# Patient Record
Sex: Female | Born: 1960 | Race: White | Hispanic: No | Smoking: Never smoker
Health system: Southern US, Community
[De-identification: ages and names within clinical notes are randomized; demographics above are authoritative.]

## PROBLEM LIST (undated history)

## (undated) DIAGNOSIS — M5135 Other intervertebral disc degeneration, thoracolumbar region: Secondary | ICD-10-CM

## (undated) DIAGNOSIS — Z0000 Encounter for general adult medical examination without abnormal findings: Principal | ICD-10-CM

## (undated) DIAGNOSIS — K635 Polyp of colon: Secondary | ICD-10-CM

## (undated) DIAGNOSIS — F32A Depression, unspecified: Secondary | ICD-10-CM

## (undated) DIAGNOSIS — K648 Other hemorrhoids: Secondary | ICD-10-CM

## (undated) DIAGNOSIS — I10 Essential (primary) hypertension: Secondary | ICD-10-CM

## (undated) DIAGNOSIS — E785 Hyperlipidemia, unspecified: Secondary | ICD-10-CM

## (undated) DIAGNOSIS — M5030 Other cervical disc degeneration, unspecified cervical region: Secondary | ICD-10-CM

## (undated) DIAGNOSIS — T7840XA Allergy, unspecified, initial encounter: Secondary | ICD-10-CM

## (undated) DIAGNOSIS — H269 Unspecified cataract: Secondary | ICD-10-CM

## (undated) DIAGNOSIS — F419 Anxiety disorder, unspecified: Secondary | ICD-10-CM

## (undated) DIAGNOSIS — R000 Tachycardia, unspecified: Secondary | ICD-10-CM

## (undated) DIAGNOSIS — E669 Obesity, unspecified: Secondary | ICD-10-CM

## (undated) DIAGNOSIS — M25519 Pain in unspecified shoulder: Secondary | ICD-10-CM

## (undated) DIAGNOSIS — K449 Diaphragmatic hernia without obstruction or gangrene: Secondary | ICD-10-CM

## (undated) DIAGNOSIS — R12 Heartburn: Secondary | ICD-10-CM

## (undated) DIAGNOSIS — L918 Other hypertrophic disorders of the skin: Secondary | ICD-10-CM

## (undated) DIAGNOSIS — J189 Pneumonia, unspecified organism: Secondary | ICD-10-CM

## (undated) DIAGNOSIS — F329 Major depressive disorder, single episode, unspecified: Secondary | ICD-10-CM

## (undated) DIAGNOSIS — E039 Hypothyroidism, unspecified: Secondary | ICD-10-CM

## (undated) DIAGNOSIS — K859 Acute pancreatitis without necrosis or infection, unspecified: Secondary | ICD-10-CM

## (undated) DIAGNOSIS — C4491 Basal cell carcinoma of skin, unspecified: Secondary | ICD-10-CM

## (undated) DIAGNOSIS — H9190 Unspecified hearing loss, unspecified ear: Secondary | ICD-10-CM

## (undated) DIAGNOSIS — M1611 Unilateral primary osteoarthritis, right hip: Secondary | ICD-10-CM

## (undated) DIAGNOSIS — E559 Vitamin D deficiency, unspecified: Secondary | ICD-10-CM

## (undated) DIAGNOSIS — R51 Headache: Secondary | ICD-10-CM

## (undated) HISTORY — DX: Unspecified cataract: H26.9

## (undated) HISTORY — DX: Headache: R51

## (undated) HISTORY — DX: Depression, unspecified: F32.A

## (undated) HISTORY — DX: Other hypertrophic disorders of the skin: L91.8

## (undated) HISTORY — DX: Heartburn: R12

## (undated) HISTORY — DX: Pneumonia, unspecified organism: J18.9

## (undated) HISTORY — DX: Allergy, unspecified, initial encounter: T78.40XA

## (undated) HISTORY — DX: Polyp of colon: K63.5

## (undated) HISTORY — DX: Major depressive disorder, single episode, unspecified: F32.9

## (undated) HISTORY — DX: Other hemorrhoids: K64.8

## (undated) HISTORY — DX: Unilateral primary osteoarthritis, right hip: M16.11

## (undated) HISTORY — DX: Vitamin D deficiency, unspecified: E55.9

## (undated) HISTORY — DX: Essential (primary) hypertension: I10

## (undated) HISTORY — DX: Hyperlipidemia, unspecified: E78.5

## (undated) HISTORY — DX: Encounter for general adult medical examination without abnormal findings: Z00.00

## (undated) HISTORY — DX: Pain in unspecified shoulder: M25.519

## (undated) HISTORY — DX: Hypothyroidism, unspecified: E03.9

## (undated) HISTORY — DX: Anxiety disorder, unspecified: F41.9

## (undated) HISTORY — DX: Basal cell carcinoma of skin, unspecified: C44.91

## (undated) HISTORY — DX: Diaphragmatic hernia without obstruction or gangrene: K44.9

## (undated) HISTORY — PX: SPINE SURGERY: SHX786

## (undated) HISTORY — DX: Obesity, unspecified: E66.9

## (undated) HISTORY — DX: Tachycardia, unspecified: R00.0

## (undated) HISTORY — DX: Unspecified hearing loss, unspecified ear: H91.90

---

## 2000-11-06 ENCOUNTER — Encounter: Admission: RE | Admit: 2000-11-06 | Discharge: 2000-11-06 | Payer: Self-pay | Admitting: Internal Medicine

## 2000-11-06 ENCOUNTER — Encounter: Payer: Self-pay | Admitting: Internal Medicine

## 2000-12-22 DIAGNOSIS — I10 Essential (primary) hypertension: Secondary | ICD-10-CM

## 2000-12-22 HISTORY — DX: Essential (primary) hypertension: I10

## 2001-04-05 LAB — HM COLONOSCOPY: HM Colonoscopy: NORMAL

## 2001-04-26 ENCOUNTER — Encounter: Admission: RE | Admit: 2001-04-26 | Discharge: 2001-04-26 | Payer: Self-pay | Admitting: Obstetrics and Gynecology

## 2001-04-26 ENCOUNTER — Encounter: Payer: Self-pay | Admitting: Obstetrics and Gynecology

## 2001-04-28 ENCOUNTER — Encounter: Payer: Self-pay | Admitting: Obstetrics and Gynecology

## 2001-04-28 ENCOUNTER — Encounter: Admission: RE | Admit: 2001-04-28 | Discharge: 2001-04-28 | Payer: Self-pay | Admitting: Obstetrics and Gynecology

## 2001-04-29 ENCOUNTER — Other Ambulatory Visit: Admission: RE | Admit: 2001-04-29 | Discharge: 2001-04-29 | Payer: Self-pay | Admitting: Obstetrics and Gynecology

## 2001-04-29 ENCOUNTER — Encounter (INDEPENDENT_AMBULATORY_CARE_PROVIDER_SITE_OTHER): Payer: Self-pay

## 2001-11-12 ENCOUNTER — Encounter: Payer: Self-pay | Admitting: Obstetrics and Gynecology

## 2001-11-12 ENCOUNTER — Encounter: Admission: RE | Admit: 2001-11-12 | Discharge: 2001-11-12 | Payer: Self-pay | Admitting: Obstetrics and Gynecology

## 2002-02-16 ENCOUNTER — Emergency Department (HOSPITAL_COMMUNITY): Admission: EM | Admit: 2002-02-16 | Discharge: 2002-02-16 | Payer: Self-pay | Admitting: Emergency Medicine

## 2002-03-11 ENCOUNTER — Encounter: Payer: Self-pay | Admitting: Internal Medicine

## 2002-03-11 ENCOUNTER — Encounter: Admission: RE | Admit: 2002-03-11 | Discharge: 2002-03-11 | Payer: Self-pay | Admitting: Internal Medicine

## 2002-09-09 ENCOUNTER — Ambulatory Visit (HOSPITAL_COMMUNITY): Admission: RE | Admit: 2002-09-09 | Discharge: 2002-09-09 | Payer: Self-pay | Admitting: Gastroenterology

## 2003-06-23 ENCOUNTER — Encounter: Admission: RE | Admit: 2003-06-23 | Discharge: 2003-06-23 | Payer: Self-pay | Admitting: Obstetrics and Gynecology

## 2003-06-23 ENCOUNTER — Encounter: Payer: Self-pay | Admitting: Obstetrics and Gynecology

## 2004-07-03 ENCOUNTER — Other Ambulatory Visit: Admission: RE | Admit: 2004-07-03 | Discharge: 2004-07-03 | Payer: Self-pay | Admitting: Obstetrics and Gynecology

## 2005-07-22 ENCOUNTER — Encounter: Admission: RE | Admit: 2005-07-22 | Discharge: 2005-07-22 | Payer: Self-pay | Admitting: Internal Medicine

## 2005-10-08 ENCOUNTER — Other Ambulatory Visit: Admission: RE | Admit: 2005-10-08 | Discharge: 2005-10-08 | Payer: Self-pay | Admitting: Obstetrics and Gynecology

## 2005-10-21 ENCOUNTER — Encounter: Admission: RE | Admit: 2005-10-21 | Discharge: 2005-10-21 | Payer: Self-pay | Admitting: Obstetrics and Gynecology

## 2006-12-22 HISTORY — PX: ANTERIOR FUSION CERVICAL SPINE: SUR626

## 2007-02-18 ENCOUNTER — Other Ambulatory Visit: Admission: RE | Admit: 2007-02-18 | Discharge: 2007-02-18 | Payer: Self-pay | Admitting: Obstetrics and Gynecology

## 2007-09-01 ENCOUNTER — Encounter: Admission: RE | Admit: 2007-09-01 | Discharge: 2007-09-01 | Payer: Self-pay | Admitting: Obstetrics and Gynecology

## 2007-10-14 ENCOUNTER — Encounter: Admission: RE | Admit: 2007-10-14 | Discharge: 2007-10-14 | Payer: Self-pay | Admitting: Family Medicine

## 2007-10-16 ENCOUNTER — Encounter: Admission: RE | Admit: 2007-10-16 | Discharge: 2007-10-16 | Payer: Self-pay | Admitting: Family Medicine

## 2007-11-05 ENCOUNTER — Ambulatory Visit (HOSPITAL_COMMUNITY): Admission: RE | Admit: 2007-11-05 | Discharge: 2007-11-06 | Payer: Self-pay | Admitting: Neurosurgery

## 2008-02-24 ENCOUNTER — Other Ambulatory Visit: Admission: RE | Admit: 2008-02-24 | Discharge: 2008-02-24 | Payer: Self-pay | Admitting: Obstetrics and Gynecology

## 2008-05-11 ENCOUNTER — Emergency Department (HOSPITAL_COMMUNITY): Admission: EM | Admit: 2008-05-11 | Discharge: 2008-05-11 | Payer: Self-pay | Admitting: Emergency Medicine

## 2008-09-19 ENCOUNTER — Encounter: Admission: RE | Admit: 2008-09-19 | Discharge: 2008-09-19 | Payer: Self-pay | Admitting: Neurosurgery

## 2008-09-20 ENCOUNTER — Encounter: Admission: RE | Admit: 2008-09-20 | Discharge: 2008-09-20 | Payer: Self-pay | Admitting: Neurosurgery

## 2008-10-10 ENCOUNTER — Encounter: Admission: RE | Admit: 2008-10-10 | Discharge: 2008-10-10 | Payer: Self-pay | Admitting: Obstetrics and Gynecology

## 2009-01-09 ENCOUNTER — Encounter: Admission: RE | Admit: 2009-01-09 | Discharge: 2009-01-09 | Payer: Self-pay | Admitting: Neurosurgery

## 2009-02-26 ENCOUNTER — Other Ambulatory Visit: Admission: RE | Admit: 2009-02-26 | Discharge: 2009-02-26 | Payer: Self-pay | Admitting: Obstetrics and Gynecology

## 2009-06-19 ENCOUNTER — Encounter: Admission: RE | Admit: 2009-06-19 | Discharge: 2009-06-19 | Payer: Self-pay | Admitting: Neurosurgery

## 2009-08-25 ENCOUNTER — Encounter: Admission: RE | Admit: 2009-08-25 | Discharge: 2009-08-25 | Payer: Self-pay | Admitting: Neurosurgery

## 2009-12-22 HISTORY — PX: POSTERIOR FUSION CERVICAL SPINE: SUR628

## 2009-12-31 ENCOUNTER — Ambulatory Visit (HOSPITAL_COMMUNITY): Admission: RE | Admit: 2009-12-31 | Discharge: 2010-01-02 | Payer: Self-pay | Admitting: Neurosurgery

## 2010-01-31 ENCOUNTER — Encounter: Admission: RE | Admit: 2010-01-31 | Discharge: 2010-01-31 | Payer: Self-pay | Admitting: Neurosurgery

## 2010-03-04 ENCOUNTER — Other Ambulatory Visit: Admission: RE | Admit: 2010-03-04 | Discharge: 2010-03-04 | Payer: Self-pay | Admitting: Obstetrics and Gynecology

## 2010-03-07 ENCOUNTER — Encounter: Admission: RE | Admit: 2010-03-07 | Discharge: 2010-03-07 | Payer: Self-pay | Admitting: Neurosurgery

## 2010-04-24 ENCOUNTER — Encounter: Admission: RE | Admit: 2010-04-24 | Discharge: 2010-07-11 | Payer: Self-pay | Admitting: Neurosurgery

## 2010-11-28 ENCOUNTER — Emergency Department (HOSPITAL_BASED_OUTPATIENT_CLINIC_OR_DEPARTMENT_OTHER)
Admission: EM | Admit: 2010-11-28 | Discharge: 2010-11-28 | Payer: Self-pay | Source: Home / Self Care | Admitting: Emergency Medicine

## 2010-12-03 ENCOUNTER — Encounter
Admission: RE | Admit: 2010-12-03 | Discharge: 2010-12-03 | Payer: Self-pay | Source: Home / Self Care | Attending: Neurosurgery | Admitting: Neurosurgery

## 2010-12-26 ENCOUNTER — Encounter: Payer: Self-pay | Admitting: Family

## 2010-12-27 ENCOUNTER — Ambulatory Visit
Admission: RE | Admit: 2010-12-27 | Discharge: 2010-12-27 | Payer: Self-pay | Source: Home / Self Care | Attending: Family | Admitting: Family

## 2010-12-27 ENCOUNTER — Telehealth: Payer: Self-pay | Admitting: Family

## 2010-12-27 DIAGNOSIS — J329 Chronic sinusitis, unspecified: Secondary | ICD-10-CM | POA: Insufficient documentation

## 2011-01-23 NOTE — Progress Notes (Signed)
  Phone Note Other Incoming   Summary of Call: Fax received from pharmacy that patient has hx of allergy to sulfisoxazole/trimethoprim.  Spoke with patient, they did not dispense rx.  Will plan to treat with doxycycline and fluticasone.  Pt instructed to use nasal saline irrigation several times a day. Initial call taken by: Lemont Fillers FNP,  December 27, 2010 4:53 PM   New Allergies: ! SULFA New/Updated Medications: DOXYCYCLINE HYCLATE 100 MG CAPS (DOXYCYCLINE HYCLATE) one cap by mouth two times a day for 10 days FLUTICASONE PROPIONATE 50 MCG/ACT SUSP (FLUTICASONE PROPIONATE) 2 sprays each nostril once daily New Allergies: ! SULFAPrescriptions: FLUTICASONE PROPIONATE 50 MCG/ACT SUSP (FLUTICASONE PROPIONATE) 2 sprays each nostril once daily  #1 x 0   Entered and Authorized by:   Lemont Fillers FNP   Signed by:   Lemont Fillers FNP on 12/27/2010   Method used:   Electronically to        [facility] (retail)       [address]       [place], [place]  [address]       Ph: [phone]       Fax: [phone]   RxID:   [id number] DOXYCYCLINE HYCLATE 100 MG CAPS (DOXYCYCLINE HYCLATE) one cap by mouth two times a day for 10 days  #20 x 0   Entered and Authorized by:   Lemont Fillers FNP   Signed by:   Lemont Fillers FNP on 12/27/2010   Method used:   Electronically to        [facility] (retail)       [address]       [place], [place]  [address]       Ph: [phone]       Fax: [phone]   RxID:   [id number]

## 2011-01-23 NOTE — Assessment & Plan Note (Signed)
Summary: NEW TO BE EST [employer] [facility] HAS BAD COLD/[facility]--Rm 5   Vital Signs:  Patient profile:   50 year old female Menstrual status:  irregular LMP:     05/22/2010 Height:      208 inches Weight:      68 pounds BMI:     1.11 Temp:     97.5 degrees F oral Pulse rate:   102 / minute Pulse rhythm:   regular Resp:     18 per minute BP sitting:   110 / 80  (right arm) Cuff size:   large  Vitals Entered By: Mervin Kung CMA Duncan Dull) (December 27, 2010 11:22 AM) CC: Pt here prior to surgery on Monday. Has had cough, sneezing and sinus drainage x weeks. Was told she could not have surgery if she still has cough. Is Patient Diabetic? No Pain Assessment Patient in pain? no      Comments Pt states she has had 2 courses of Doxycycline with little improvement.  Pt states she is going to be seeing [place] for Primary Care eventually but is waiting until her disability closes with her current PMD.  Mervin Kung CMA Duncan Dull)  December 27, 2010 11:31 AM  LMP (date): 05/22/2010     Menstrual Status irregular Enter LMP: 05/22/2010   CC:  Pt here prior to surgery on Monday. Has had cough and sneezing and sinus drainage x weeks. Was told she could not have surgery if she still has cough..  History of Present Illness: Kathy Huerta is a 50 year old female who presents today to establish care.  She has been followed by Dr. Donnal Moat for primary care, but notes that she plans to move permanantly to [facility] as soon as her disability evaluation is completed under Dr. Susette Racer.  "I don't want to confuse matters any." Patient notes that she is scheduled for catarct surgery on monday. She comes with chief complaint of cough.    1) Cough-  Cough started about 12/7 while she was on vacation.  Has tried doxycline x 2 rounds- total of 19 days without improvement.  Took husbands "left over doxy" for the second round.  Denies fever.  Symptoms are associated with green sinus drainage/post-nasal drip L>R, mild left  ear pain, left sided facial pain and productive cough. She has not visualized the sputum. Cough is worsened with laying flat, and improved with sitting up.  2) Cervical Disc Disease- sees Dr.  Donalee Citrin- neurosurgery.   3) Cataracts- reports that this was caused by neurontin.  She is scheduled for cataract surgery on Monday.   Preventive Screening-Counseling & Management  Alcohol-Tobacco     Alcohol drinks/day: 4 per weekend     Alcohol type: beer     Smoking Status: quit     Pack years: 1 year  Caffeine-Diet-Exercise     Caffeine use/day: none     Does Patient Exercise: no  Allergies (verified): 1)  ! Augmentin 2)  ! Pcn 3)  ! Ancef 4)  ! Biaxin 5)  ! Zithromax 6)  ! Avelox 7)  ! Sulfa  Past History:  Past Medical History: DDD-- on disability HTN Depression Heartburn  Past Surgical History: Anterior Fusion of C6-7--2008 (Dr Wynetta Emery) Posterior Fusion of C6-7-- 2011  Family History: Reviewed history and no changes required. Mother-- living; HTN, hypercholesterolemia Father-- living; Colectomy, DDD, cardiac stent, A-fib. 1 brother-- HTN  No children  Social History: Reviewed history and no changes required. 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 Regular exercise-no Smoking Status:  quit Caffeine use/day:  none Does Patient Exercise:  no  Review of Systems       Constitutional: Denies Fever ENT:  see HPI Resp: see HPI, occasional SOB since she has been having uri symptoms CV:  Denies Chest Pain GI:  Denies nausea or vomitting or diarrhea GU: Denies dysuria Lymphatic: reports mild left anterior lymphadenopathy Musculoskeletal:  Denies muscle/joint pain- except chronic neck pain Skin:  Denies Rashes Psychiatric: depression- pt reports that this is well controlled.   Neuro: Denies numbness     Physical Exam  General:  Well-developed,well-nourished,in no  acute distress; alert,appropriate and cooperative throughout examination Head:  + maxillary sinus tenderness to palpation Eyes:  PERRLA Ears:  External ear exam shows no significant lesions or deformities.  Otoscopic examination reveals clear canals, tympanic membranes are intact bilaterally without bulging, retraction, inflammation or discharge. Hearing is grossly normal bilaterally. Mouth:  Oral mucosa and oropharynx without lesions or exudates.  Teeth in good repair. Neck:  No deformities, masses, or tenderness noted. Lungs:  Normal respiratory effort, chest expands symmetrically. Lungs are clear to auscultation, no crackles or wheezes. Heart:  Normal rate and regular rhythm. S1 and S2 normal without gallop, murmur, click, rub or other extra sounds. Psych:  Cognition and judgment appear intact. Alert and cooperative with normal attention span and concentration. No apparent delusions, illusions, hallucinations   Impression & Recommendations:  Problem # 1:  SINUSITIS (ICD-473.9) Assessment New Noted multiple drug allergies.  Initially planned to treat with bactrim- as she denied allergy.  Pharmacist called and alerted [place] that this was a listed allergy. Doxycyline was called in instead.   Her updated medication list for this problem includes:    Doxycycline Hyclate 100 Mg Caps (Doxycycline hyclate) ..... One cap by mouth two times a day for 10 days    Fluticasone Propionate 50 Mcg/act Susp (Fluticasone propionate) .Marland Kitchen... 2 sprays each nostril once daily  Complete Medication List: 1)  Flexeril 10 Mg Tabs (Cyclobenzaprine hcl) .... Take 1 tablet at bedtime. 2)  Valium 5 Mg Tabs (Diazepam) .... Take 1 tab by mouth at bedtime. 3)  Benicar 20 Mg Tabs (Olmesartan medoxomil) .... Take 1 tablet by mouth once a day. 4)  Maxzide-25 37.5-25 Mg Tabs (Triamterene-hctz) .... Take 1 tablet by mouth once a day. 5)  Percocet 5-325 Mg Tabs (Oxycodone-acetaminophen) .... Take 1 tablet every 6 hours as  needed. 6)  Zoloft 100 Mg Tabs (Sertraline hcl) .... Take 1 tablet by mouth every morning. 7)  Nevanac 0.1 % Susp (Nepafenac) .Marland Kitchen.. 1 drop left eye 4 times daily. 8)  Tobradex St 0.3-0.05 % Susp (Tobramycin-dexamethasone) .Marland Kitchen.. 1 dropt to left eye 4 times daily. 9)  Durezol 0.05 % Emul (Difluprednate) .Marland Kitchen.. 1 drop to left 4 times daily. 10)  Azasite 1 % Soln (Azithromycin) .Marland Kitchen.. 1 drop to left eye 4 times daily. 11)  Doxycycline Hyclate 100 Mg Caps (Doxycycline hyclate) .... One cap by mouth two times a day for 10 days 12)  Fluticasone Propionate 50 Mcg/act Susp (Fluticasone propionate) .... 2 sprays each nostril once daily  Patient Instructions: 1)  Call if you develop fever over 101, increasing sinus pressure, pain with eye movement, increased facial tenderness of swelling, or if you develop visual changes. 2)  Please schedule a complete physical at your earliest convenience. Prescriptions: BACTRIM 400-80 MG TABS (SULFAMETHOXAZOLE-TRIMETHOPRIM) one tablet by mouth two  times a day for 10 days  #20 x 0   Entered and Authorized by:   Lemont Fillers FNP   Signed by:   Lemont Fillers FNP on 12/27/2010   Method used:   Electronically to        [facility] (retail)       [address]       [place], [place]  [address]       Ph: [phone]       Fax: [phone]   RxID:   [id number]    Orders Added: 1)  New Patient Level III [16010]    Current Allergies (reviewed today): ! AUGMENTIN ! PCN ! ANCEF ! BIAXIN ! ZITHROMAX ! AVELOX ! SULFA

## 2011-01-23 NOTE — Medication Information (Signed)
Summary: Fax Regarding Allergy to Sulfisoxazole/[facility]  Fax Regarding Allergy to Sulfisoxazole/[facility]   Imported By: Lanelle Bal 01/15/2011 08:39:58  _____________________________________________________________________  External Attachment:    Type:   Image     Comment:   External Document

## 2011-03-03 LAB — HM PAP SMEAR: HM Pap smear: NORMAL

## 2011-03-06 ENCOUNTER — Other Ambulatory Visit: Payer: Self-pay | Admitting: Obstetrics and Gynecology

## 2011-03-06 ENCOUNTER — Other Ambulatory Visit (HOSPITAL_COMMUNITY)
Admission: RE | Admit: 2011-03-06 | Discharge: 2011-03-06 | Disposition: A | Discharge status: Home / Self Care | Payer: 59 | Source: Ambulatory Visit | Attending: Obstetrics and Gynecology | Admitting: Obstetrics and Gynecology

## 2011-03-06 DIAGNOSIS — Z01419 Encounter for gynecological examination (general) (routine) without abnormal findings: Secondary | ICD-10-CM | POA: Insufficient documentation

## 2011-03-09 LAB — BASIC METABOLIC PANEL
BUN: 13 mg/dL (ref 6–23)
CO2: 27 mEq/L (ref 19–32)
Chloride: 104 mEq/L (ref 96–112)
Potassium: 4.7 mEq/L (ref 3.5–5.1)

## 2011-03-09 LAB — CBC
HCT: 40.6 % (ref 36.0–46.0)
MCHC: 35.2 g/dL (ref 30.0–36.0)
MCV: 91.6 fL (ref 78.0–100.0)
Platelets: 295 10*3/uL (ref 150–400)
WBC: 7.8 10*3/uL (ref 4.0–10.5)

## 2011-04-07 ENCOUNTER — Emergency Department (HOSPITAL_BASED_OUTPATIENT_CLINIC_OR_DEPARTMENT_OTHER)
Admission: EM | Admit: 2011-04-07 | Discharge: 2011-04-07 | Disposition: A | Discharge status: Home / Self Care | Payer: 59 | Attending: Emergency Medicine | Admitting: Emergency Medicine

## 2011-04-07 DIAGNOSIS — A059 Bacterial foodborne intoxication, unspecified: Secondary | ICD-10-CM | POA: Insufficient documentation

## 2011-04-07 DIAGNOSIS — R197 Diarrhea, unspecified: Secondary | ICD-10-CM | POA: Insufficient documentation

## 2011-04-07 DIAGNOSIS — M62838 Other muscle spasm: Secondary | ICD-10-CM | POA: Insufficient documentation

## 2011-04-07 DIAGNOSIS — I10 Essential (primary) hypertension: Secondary | ICD-10-CM | POA: Insufficient documentation

## 2011-04-07 DIAGNOSIS — E785 Hyperlipidemia, unspecified: Secondary | ICD-10-CM | POA: Insufficient documentation

## 2011-04-07 DIAGNOSIS — Z79899 Other long term (current) drug therapy: Secondary | ICD-10-CM | POA: Insufficient documentation

## 2011-04-07 LAB — CBC
HCT: 38.6 % (ref 36.0–46.0)
Hemoglobin: 13.9 g/dL (ref 12.0–15.0)
MCV: 84.5 fL (ref 78.0–100.0)
RDW: 12.5 % (ref 11.5–15.5)
WBC: 11.2 10*3/uL — ABNORMAL HIGH (ref 4.0–10.5)

## 2011-04-07 LAB — POCT I-STAT, CHEM 8
BUN: 17 mg/dL (ref 6–23)
Calcium, Ion: 1.01 mmol/L — ABNORMAL LOW (ref 1.12–1.32)
Chloride: 102 mEq/L (ref 96–112)
Creatinine, Ser: 0.7 mg/dL (ref 0.4–1.2)
Glucose, Bld: 100 mg/dL — ABNORMAL HIGH (ref 70–99)

## 2011-04-07 LAB — DIFFERENTIAL
Basophils Relative: 0 % (ref 0–1)
Eosinophils Absolute: 0 10*3/uL (ref 0.0–0.7)
Eosinophils Relative: 0 % (ref 0–5)
Lymphs Abs: 0.4 10*3/uL — ABNORMAL LOW (ref 0.7–4.0)
Monocytes Relative: 4 % (ref 3–12)

## 2011-04-17 ENCOUNTER — Emergency Department (INDEPENDENT_AMBULATORY_CARE_PROVIDER_SITE_OTHER): Payer: 59

## 2011-04-17 ENCOUNTER — Telehealth: Payer: Self-pay | Admitting: *Deleted

## 2011-04-17 ENCOUNTER — Emergency Department (HOSPITAL_BASED_OUTPATIENT_CLINIC_OR_DEPARTMENT_OTHER)
Admission: EM | Admit: 2011-04-17 | Discharge: 2011-04-17 | Disposition: A | Discharge status: Home / Self Care | Payer: 59 | Attending: Emergency Medicine | Admitting: Emergency Medicine

## 2011-04-17 DIAGNOSIS — I10 Essential (primary) hypertension: Secondary | ICD-10-CM | POA: Insufficient documentation

## 2011-04-17 DIAGNOSIS — K859 Acute pancreatitis without necrosis or infection, unspecified: Secondary | ICD-10-CM | POA: Insufficient documentation

## 2011-04-17 DIAGNOSIS — R5381 Other malaise: Secondary | ICD-10-CM | POA: Insufficient documentation

## 2011-04-17 DIAGNOSIS — D72829 Elevated white blood cell count, unspecified: Secondary | ICD-10-CM

## 2011-04-17 DIAGNOSIS — R110 Nausea: Secondary | ICD-10-CM

## 2011-04-17 DIAGNOSIS — E785 Hyperlipidemia, unspecified: Secondary | ICD-10-CM | POA: Insufficient documentation

## 2011-04-17 DIAGNOSIS — Z79899 Other long term (current) drug therapy: Secondary | ICD-10-CM | POA: Insufficient documentation

## 2011-04-17 DIAGNOSIS — K5900 Constipation, unspecified: Secondary | ICD-10-CM

## 2011-04-17 LAB — COMPREHENSIVE METABOLIC PANEL
ALT: 91 U/L — ABNORMAL HIGH (ref 0–35)
Albumin: 4.6 g/dL (ref 3.5–5.2)
Alkaline Phosphatase: 105 U/L (ref 39–117)
BUN: 20 mg/dL (ref 6–23)
Chloride: 98 mEq/L (ref 96–112)
Glucose, Bld: 96 mg/dL (ref 70–99)
Potassium: 3.6 mEq/L (ref 3.5–5.1)
Sodium: 138 mEq/L (ref 135–145)
Total Bilirubin: 0.6 mg/dL (ref 0.3–1.2)
Total Protein: 8.1 g/dL (ref 6.0–8.3)

## 2011-04-17 LAB — URINALYSIS, ROUTINE W REFLEX MICROSCOPIC
Bilirubin Urine: NEGATIVE
Glucose, UA: NEGATIVE mg/dL
Hgb urine dipstick: NEGATIVE
Ketones, ur: NEGATIVE mg/dL
pH: 6.5 (ref 5.0–8.0)

## 2011-04-17 LAB — CBC
HCT: 38.5 % (ref 36.0–46.0)
MCV: 85.6 fL (ref 78.0–100.0)
Platelets: 342 10*3/uL (ref 150–400)
RBC: 4.5 MIL/uL (ref 3.87–5.11)
WBC: 14 10*3/uL — ABNORMAL HIGH (ref 4.0–10.5)

## 2011-04-17 LAB — URINE MICROSCOPIC-ADD ON

## 2011-04-17 LAB — POCT CARDIAC MARKERS
CKMB, poc: <1 ng/mL — ABNORMAL LOW (ref 1.0–8.0)
Troponin i, poc: <0.05 ng/mL (ref 0.00–0.09)

## 2011-04-17 LAB — DIFFERENTIAL
Basophils Absolute: 0 10*3/uL (ref 0.0–0.1)
Lymphocytes Relative: 17 % (ref 12–46)
Lymphs Abs: 2.4 10*3/uL (ref 0.7–4.0)
Neutrophils Relative %: 71 % (ref 43–77)

## 2011-04-17 LAB — CK: Total CK: 24 U/L (ref 7–177)

## 2011-04-17 NOTE — Telephone Encounter (Signed)
Received call from pt's husband stating that pt just called him crying that she tried to open her door and could not remember how to do that. He states this has never happened before. Reports that she just started Lipitor 2 days ago. He wanted to make an appt for pt to see Dr Artist Pais today. Advised him pt should be evaluated in the ER and he voices understanding.

## 2011-04-23 ENCOUNTER — Encounter: Payer: Self-pay | Admitting: Internal Medicine

## 2011-04-24 ENCOUNTER — Ambulatory Visit (INDEPENDENT_AMBULATORY_CARE_PROVIDER_SITE_OTHER): Payer: 59 | Admitting: Internal Medicine

## 2011-04-24 ENCOUNTER — Encounter: Payer: Self-pay | Admitting: Internal Medicine

## 2011-04-24 ENCOUNTER — Other Ambulatory Visit: Payer: Self-pay | Admitting: Internal Medicine

## 2011-04-24 ENCOUNTER — Telehealth: Payer: Self-pay | Admitting: *Deleted

## 2011-04-24 DIAGNOSIS — K859 Acute pancreatitis without necrosis or infection, unspecified: Secondary | ICD-10-CM

## 2011-04-24 DIAGNOSIS — I10 Essential (primary) hypertension: Secondary | ICD-10-CM | POA: Insufficient documentation

## 2011-04-24 DIAGNOSIS — R7401 Elevation of levels of liver transaminase levels: Secondary | ICD-10-CM | POA: Insufficient documentation

## 2011-04-24 DIAGNOSIS — M542 Cervicalgia: Secondary | ICD-10-CM

## 2011-04-24 DIAGNOSIS — G8929 Other chronic pain: Secondary | ICD-10-CM

## 2011-04-24 DIAGNOSIS — R7402 Elevation of levels of lactic acid dehydrogenase (LDH): Secondary | ICD-10-CM | POA: Insufficient documentation

## 2011-04-24 DIAGNOSIS — N179 Acute kidney failure, unspecified: Secondary | ICD-10-CM

## 2011-04-24 DIAGNOSIS — K219 Gastro-esophageal reflux disease without esophagitis: Secondary | ICD-10-CM

## 2011-04-24 HISTORY — DX: Acute pancreatitis without necrosis or infection, unspecified: K85.90

## 2011-04-24 LAB — BASIC METABOLIC PANEL WITH GFR
CO2: 26 mEq/L (ref 19–32)
Calcium: 10.1 mg/dL (ref 8.4–10.5)
Creat: 0.85 mg/dL (ref 0.40–1.20)
GFR, Est African American: >60 mL/min (ref >60)
GFR, Est Non African American: >60 mL/min (ref >60)
Sodium: 140 mEq/L (ref 135–145)

## 2011-04-24 LAB — HEPATIC FUNCTION PANEL
AST: 29 U/L (ref 0–37)
Albumin: 4.8 g/dL (ref 3.5–5.2)
Bilirubin, Direct: 0.1 mg/dL (ref 0.0–0.3)
Total Bilirubin: 0.4 mg/dL (ref 0.3–1.2)

## 2011-04-24 LAB — LIPASE: Lipase: 42 U/L (ref 0–75)

## 2011-04-24 MED ORDER — PANTOPRAZOLE SODIUM 40 MG PO TBEC: 40.0000 mg | DELAYED_RELEASE_TABLET | Freq: Every day | ORAL | Status: DC

## 2011-04-24 NOTE — Patient Instructions (Signed)
Increase fluid intake Stop your blood pressure medications

## 2011-04-24 NOTE — Telephone Encounter (Signed)
Received call from pt requesting a return call regarding her STAT lab results that were drawn this morning. Pt is anxious and awaiting a return call today. Please advise.

## 2011-04-24 NOTE — Telephone Encounter (Signed)
Pt notified of lab results Pt will call radiology to set up CT scan of neck and thoracic spine

## 2011-04-24 NOTE — Progress Notes (Signed)
Subjective:     Patient ID: Kathy Huerta, female   DOB: [birth year], 50 y.o.   MRN: [id number]  HPI  50 year old female with history of chronic cervical disease and hypertension to establish.  Patient reports emergency room evaluation on 04/17/2011 for food poisoning/acute gastroenteritis. Patient with treated with IV fluids.  Her lipase and liver enzymes were elevated. CT of abdomen and pelvis was negative ( Liver, gallbladder, biliary system, pancreas, spleen, and adrenal glands are within normal limits for noncontrast study and demonstrate no acute process. )  Her elevated lipase was attributed to dehydration and to statin use and Lipitor was discontinued.  Patient reports feeling somewhat weak and fatigued over the last 1 to 2 weeks.   She has been eating and drinking normally.  During same time period, patient has been working with her neurosurgeon regarding chronic left-sided neck and shoulder pain. Patient noted to have swelling near her left trapezius muscle.  Her symptoms thought to be secondary to muscle spasm.   She was treated with muscle relaxers and  Mobic 15 mg twice a day.  Review of Systems Weakness,  No nausea or vomiting.  Pt complains of GERD symptoms    Past Medical History  Diagnosis Date  . DDD (degenerative disc disease)     on disability  . HTN (hypertension)   . Depression   . Heartburn     History   Social History  . Marital Status: Married    Spouse Name: N/A    Number of Children: N/A  . Years of Education: N/A   Occupational History  . Not on file.   Social History Main Topics  . Smoking status: Former Games developer  . Smokeless tobacco: Not on file  . Alcohol Use: Not on file  . Drug Use: Not on file  . Sexually Active: Not on file   Other Topics Concern  . 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 Anterior fusion cervical spine 2008    C6-7  Dr Wynetta Emery  . Posterior fusion cervical spine 2011    C6-7    Family History  Problem Relation Age of Onset  . Hypertension Mother   . Hyperlipidemia Mother   . Other Father     DDD, colectomy,cardiac stent, afib  . Hypertension Brother     Allergies  Allergen Reactions  . Azithromycin     REACTION: Throat Swelling  . Cefazolin     REACTION: Throat Swelling  . Clarithromycin     REACTION: Throat Swelling  . YQM:VHQIONGEXBM+WUXLKGMWN+UUVOZDGUYQ Acid+Aspartame     REACTION: Throat swelling.  . Moxifloxacin     REACTION: Throat Swelling  . Penicillins     REACTION: throat swelling  . Sulfonamide Derivatives     Current Outpatient Prescriptions on File Prior to Visit  Medication Sig Dispense Refill  . diazepam (VALIUM) 5 MG tablet Take 5 mg by mouth at bedtime.        Marland Kitchen olmesartan (BENICAR) 20 MG tablet Take 20 mg by mouth daily.        . sertraline (ZOLOFT) 100 MG tablet Take 100 mg by mouth every morning.        . triamterene-hydrochlorothiazide (MAXZIDE-25) 37.5-25 MG per tablet Take  1 tablet by mouth daily.        Marland Kitchen azithromycin (AZASITE) 1 % ophthalmic solution Place 1 drop into the left eye 4 (four) times daily.        . Difluprednate (DUREZOL) 0.05 % EMUL Apply 1 drop to eye 4 (four) times daily. To left eye       . nepafenac (NEVANAC) 0.1 % ophthalmic suspension Place 3 drops into the left eye 4 (four) times daily.        Marland Kitchen oxyCODONE-acetaminophen (PERCOCET) 5-325 MG per tablet Take 1 tablet by mouth every 6 (six) hours as needed.          BP 118/64  Pulse 95  Temp(Src) 98.1 F (36.7 C) (Oral)  Resp 18  Ht 5\' 8"  (1.727 m)  Wt 201 lb (91.173 kg)  BMI 30.56 kg/m2  SpO2 97%    Objective:   Physical Exam  Constitutional: Appears well-developed and well-nourished.  No distress.  HENT:  Head: Normocephalic and atraumatic.  Mouth/Throat: Oropharynx is clear and moist.  Eyes: Conjunctivae are normal. Pupils are equal, round, and reactive to light.  Neck: localized edema left upper trapezius area .   Cardiovascular: Normal rate, regular rhythm and normal heart sounds.  Exam reveals no gallop and no friction rub.   No murmur heard. Pulmonary/Chest: Effort normal and breath sounds normal. He has no wheezes. He has no rales.  Abdominal: Soft. Bowel sounds are normal. He exhibits no mass. There is no tenderness.  Neurological: Alert. No cranial nerve deficit.  Skin: Skin is warm and dry.  Psychiatric: Normal mood and affect. Behavior is normal.      Assessment:         Plan:

## 2011-04-25 ENCOUNTER — Ambulatory Visit (INDEPENDENT_AMBULATORY_CARE_PROVIDER_SITE_OTHER)
Admission: RE | Admit: 2011-04-25 | Discharge: 2011-04-25 | Disposition: A | Discharge status: Home / Self Care | Payer: 59 | Source: Ambulatory Visit | Attending: Internal Medicine | Admitting: Internal Medicine

## 2011-04-25 ENCOUNTER — Ambulatory Visit (HOSPITAL_BASED_OUTPATIENT_CLINIC_OR_DEPARTMENT_OTHER)
Admission: RE | Admit: 2011-04-25 | Discharge: 2011-04-25 | Disposition: A | Discharge status: Home / Self Care | Payer: 59 | Source: Ambulatory Visit | Attending: Internal Medicine | Admitting: Internal Medicine

## 2011-04-25 DIAGNOSIS — M542 Cervicalgia: Secondary | ICD-10-CM

## 2011-04-25 DIAGNOSIS — Z981 Arthrodesis status: Secondary | ICD-10-CM

## 2011-04-25 DIAGNOSIS — IMO0002 Reserved for concepts with insufficient information to code with codable children: Secondary | ICD-10-CM | POA: Insufficient documentation

## 2011-04-25 DIAGNOSIS — M549 Dorsalgia, unspecified: Secondary | ICD-10-CM

## 2011-04-25 DIAGNOSIS — M5030 Other cervical disc degeneration, unspecified cervical region: Secondary | ICD-10-CM | POA: Insufficient documentation

## 2011-04-25 DIAGNOSIS — R209 Unspecified disturbances of skin sensation: Secondary | ICD-10-CM

## 2011-04-25 LAB — LIPID PANEL
Cholesterol: 190 mg/dL (ref 0–200)
HDL: 51 mg/dL (ref >39)
Total CHOL/HDL Ratio: 3.7 Ratio
Triglycerides: 212 mg/dL — ABNORMAL HIGH (ref <150)

## 2011-05-01 ENCOUNTER — Ambulatory Visit: Payer: 59 | Admitting: Internal Medicine

## 2011-05-06 NOTE — Op Note (Signed)
Kathy Huerta, Kathy Huerta                  ACCOUNT NO.:  [id number]   MEDICAL RECORD NO.:  [id number]          PATIENT TYPE:  AMB   LOCATION:  SDS                          FACILITY:  [facility]   PHYSICIAN:  Donalee Citrin, M.D.        DATE OF BIRTH:  04-14-1961   DATE OF PROCEDURE:  11/05/2007  DATE OF DISCHARGE:                               OPERATIVE REPORT   PREOPERATIVE DIAGNOSIS:  Cervical spondylosis C5-6, C6-7 with left  greater than right C6 and C7 radiculopathies.   PROCEDURE:  Anterior cervical diskectomies and fusion at C5-6 and C6-7  using 8 mm allograft wedge at C5-6 and a 9 mm allograft wedge at C6-7,  and a 43 mm Venture plate with six 16-XW variable angled screws.   SURGEON:  Donalee Citrin, M.D.   ASSISTANT:  Reinaldo Meeker, M.D.   ANESTHESIA:  General endotracheal.   HISTORY OF PRESENT ILLNESS:  The patient is a very pleasant 50 year old  female who has had longstanding neck pain and bilateral arm pain  radiating worse in the left arm down the triceps and the forearm, then  the thumb and first two fingers of her left hand.  This all started  experiencing down the right arm.  The patient had weakness in both  triceps at 4+/5 in the right and 4/5 in the left.  The patient failed  all forms of conservative treatment and the MRI scan showed severe  spondylosis with cervical stenosis at C5-6 and C6-7 with a large disk  herniation on the left at C6-7 and biforaminal stenosis in both levels.  The patient was recommended the anterior cervical diskectomy.  Procedure  risks and benefits of the operation were explained.  The patient  understands and agreed to proceed forth.   The patient was brought to the OR and was induced under general  anesthesia.  She was placed supine.  The neck was placed in extension, 5  pounds of Holter traction on the right.  Cervical neck was prepped and  draped in the usual sterile fashion.  Preoperative x-ray localized the  C6 vertebral body, so a curvilinear  incision was made just off the  midline to anterior portion to the sternocleidomastoid and superficial  layer of the platysma was dissected and divided longitudinally.  The  avascular planes to the sternocleidomastoid strap muscles was developed  under the prevertebral fascia.  Prevertebral fascia was divided using  Kitner's.  Intraoperative x-ray confirmed localization of the C5-6  interspace.  Annulotomy was done with the 15 blade scalpel, marked the  disk space and then longus colli was reflected laterally and septal  retractor was placed.  The annulotomies were extended at both 5-6 and 6-  7.  Large anterior osteophytes were bitten off with a 3-mm Kerrison  punch at both C5 and C6 and then both interspaces were drilled down to  the posterior annulus and posterior osteophytic complexes, first working  at C6-7.  The operative microscope was draped, brought into the field.  Under microscopic illumination, the remainder of the disk space was  drilled down  to posterior annulus and then using a 1 mm Kerrison punch,  the undersurface of the posterior annulus and the posterior longitudinal  ligament was underbitten, exposing the thecal sac.  This was noted to be  displaced markedly dorsally with a large free fragment disk herniation  causing thecal sac compression as well as left C7 foraminal stenosis.  Very large several fragments of disk were teased out with a black nerve  hook and removed in a piecemeal fashion, then the end plates were  aggressively underbitten.  The C7 pedicle was identified and the C7  nerve root was skeletonized flush with the pedicle and then easily took  a nerve hook confirming no migration of disk material further in the  foramen.  Then the end plates were aggressively underbitten marginally  across to the right side.  The right C7 pedicle was identified and  palpated and the right C7 nerve root was skeletonized.  Then Gelfoam was  placed.  Attention was taken to the  C5-6.  In a similar fashion,  aggressive diskectomies were performed with underbiting of the end  plates to the level of both C6 pedicles and both C6 nerve roots were  decompressed and skeletonized out the foramen.  Then both sets of end  plates were scraped with a BA curet.  Meticulous hemostasis was  maintained.  First, an 8 graft was inserted at C6-7, however, this was  felt to be too small so this had to be placed at C5-6 after we had  already opened up a 7 graft for C5-6, which was also noted to be too  small.  So we had to discard the 7 graft.  The 8 graft was inserted at  C5-6 and then a 9 graft was opened and inserted at C6-7.  Then a Venture  plate was placed 42 mm and six 13 mm variable angled screws were drilled  and placed.  All screws had excellent purchase.  The locking mechanism  was engaged.  The wound was then copiously irrigated.  Meticulous  hemostasis was maintained.  The platysma was reapproximated with  interrupted Vicryl and the skin was closed with running 4-0 subcuticular  and benzoin and Steri-Strips were applied.  The patient went to the  recovery room in stable condition.  At the end of the case, needle  counts and sponge counts were correct.           ______________________________  Donalee Citrin, M.D.     GC/MEDQ  D:  11/05/2007  T:  11/05/2007  Job:  [id number]

## 2011-05-09 NOTE — Op Note (Signed)
   NAME:  Kathy Huerta, Kathy Huerta                            ACCOUNT NO.:  [id number]   MEDICAL RECORD NO.:  [id number]                   PATIENT TYPE:  AMB   LOCATION:  ENDO                                 FACILITY:  [facility]   PHYSICIAN:  Charna Elizabeth, M.D.                   DATE OF BIRTH:  02-17-1961   DATE OF PROCEDURE:  09/09/2002  DATE OF DISCHARGE:  09/09/2002                                 OPERATIVE REPORT   PROCEDURE PERFORMED:  Colonoscopy.   ENDOSCOPIST:  Charna Elizabeth, M.D.   INSTRUMENT USED:  Olympus video colonoscope.   INDICATIONS FOR PROCEDURE:  The patient is a 50 year old white female with a  history of severe constipation with worsening of her problems in the recent  past and rectal bleeding.  The patient has a family history of colonic  polyps in her father.  Rule out colonic polyps, masses, etc.   PREPROCEDURE PREPARATION:  Informed consent was procured from the patient.  The patient was fasted for eight hours prior to the procedure and prepped  with a bottle of magnesium citrate and a gallon of NuLytely the night prior  to the procedure.  She also used two Fleet's enemas the night prior to the  procedure.   PREPROCEDURE PHYSICAL:  The patient had stable vital signs. Neck supple.  Chest clear to auscultation.  S1 and S2 regular.  Abdomen soft with normal  bowel sounds.   DESCRIPTION OF PROCEDURE:  The patient was placed in left lateral decubitus  position and sedated with 70 mg of Demerol and 8 mg of Versed intravenously.  Once the patient was adequately sedated and maintained on low flow oxygen  and continuous cardiac monitoring, the Olympus video colonoscope was  advanced from the rectum to the cecum and terminal ileum without difficulty.  Except for small nonbleeding internal hemorrhoids, no other abnormalities  were noted.  The entire colonic mucosa appeared healthy with a normal  vascular pattern.   IMPRESSION:  Normal colon up to the terminal ileum except for  small  nonbleeding internal hemorrhoids.   RECOMMENDATIONS:  1. The patient has been advised to restart Zelnorm 6 mg b.i.d.  2. Liberal fluid intake has been recommended along with a high fiber diet.  3. Outpatient follow-up in the next two weeks.                                                  Charna Elizabeth, M.D.   JM/MEDQ  D:  09/14/2002  T:  09/15/2002  Job:  [id number]   cc:   Sharlet Salina, M.D.

## 2011-05-13 ENCOUNTER — Other Ambulatory Visit: Payer: Self-pay | Admitting: Neurosurgery

## 2011-05-13 DIAGNOSIS — M549 Dorsalgia, unspecified: Secondary | ICD-10-CM

## 2011-05-13 DIAGNOSIS — M25512 Pain in left shoulder: Secondary | ICD-10-CM

## 2011-05-20 ENCOUNTER — Ambulatory Visit
Admission: RE | Admit: 2011-05-20 | Discharge: 2011-05-20 | Disposition: A | Discharge status: Home / Self Care | Payer: 59 | Source: Ambulatory Visit | Attending: Neurosurgery | Admitting: Neurosurgery

## 2011-05-20 ENCOUNTER — Other Ambulatory Visit: Payer: Self-pay | Admitting: Neurosurgery

## 2011-05-20 DIAGNOSIS — M25512 Pain in left shoulder: Secondary | ICD-10-CM

## 2011-05-20 DIAGNOSIS — M549 Dorsalgia, unspecified: Secondary | ICD-10-CM

## 2011-05-20 MED ORDER — GADOBENATE DIMEGLUMINE 529 MG/ML IV SOLN
19.0000 mL | Freq: Once | INTRAVENOUS | Status: AC | PRN
  Administered 2011-05-20: 19 mL via INTRAVENOUS

## 2011-07-11 DIAGNOSIS — G8929 Other chronic pain: Secondary | ICD-10-CM | POA: Insufficient documentation

## 2011-07-11 DIAGNOSIS — K219 Gastro-esophageal reflux disease without esophagitis: Secondary | ICD-10-CM | POA: Insufficient documentation

## 2011-07-11 NOTE — Assessment & Plan Note (Signed)
Pt has hx of ant and post cervical fusion performed by Dr. Wynetta Emery.   Pain mgt as per Dr. Wynetta Emery

## 2011-07-11 NOTE — Assessment & Plan Note (Addendum)
Pt reports recent bout of presumed gastroenteritis.  Her symptoms are resolving.  Monitor LFTs.  Hold Lipitor for now.

## 2011-07-11 NOTE — Assessment & Plan Note (Signed)
Her GERD symptoms likely exacerbated by Mobic use.  Use protonix for gastric protection.

## 2011-07-11 NOTE — Assessment & Plan Note (Signed)
BP: 96/64 mmHg  Pt BP is low normal.  Hold BP meds for now.  Continue to monitor BP at home.  Pt advised to reports any significant change in her blood pressure Lab Results  Component Value Date   CREATININE 0.85 04/24/2011   Lab Results  Component Value Date   NA 140 04/24/2011   K 4.6 04/24/2011   CL 102 04/24/2011   CO2 26 04/24/2011

## 2011-07-29 ENCOUNTER — Ambulatory Visit (INDEPENDENT_AMBULATORY_CARE_PROVIDER_SITE_OTHER): Payer: 59 | Admitting: Internal Medicine

## 2011-07-29 ENCOUNTER — Encounter: Payer: Self-pay | Admitting: Internal Medicine

## 2011-07-29 DIAGNOSIS — M7989 Other specified soft tissue disorders: Secondary | ICD-10-CM

## 2011-07-29 DIAGNOSIS — M799 Soft tissue disorder, unspecified: Secondary | ICD-10-CM

## 2011-07-31 DIAGNOSIS — M7989 Other specified soft tissue disorders: Secondary | ICD-10-CM | POA: Insufficient documentation

## 2011-07-31 NOTE — Progress Notes (Signed)
  Subjective:    Patient ID: Kathy Huerta, female    DOB: [birth year], 50 y.o.   MRN: [id number]  HPI patient presents to clinic for evaluation of soft tissue mass. Has undergone cervical neck surgery and has been recently evaluated by surgery for a left paracentral soft tissue mass located roughly along medial left trapezius area. Area is nontender. No injury or trauma. Surgery has recommended MRI for further evaluation. Reviewed with patient her cervical CT and thoracic MRI results. No other complaints. Total time of visit approximately 28 minutes of which greater than 50% was spent in counseling.    Review of Systems     Objective:   Physical Exam  Nursing note and vitals reviewed. Constitutional: She appears well-developed and well-nourished. No distress.  Musculoskeletal:       Soft tissue mass nontender located left of cervical spine. Does not appear to communicate with spine. Non-mobile. No overlying rash or wound.  Skin: She is not diaphoretic.          Assessment & Plan:

## 2011-07-31 NOTE — Assessment & Plan Note (Signed)
Recommend proceeding with MRI for further evaluation

## 2011-08-18 ENCOUNTER — Ambulatory Visit: Payer: 59 | Attending: Physical Medicine and Rehabilitation | Admitting: Physical Therapy

## 2011-08-18 DIAGNOSIS — M542 Cervicalgia: Secondary | ICD-10-CM | POA: Insufficient documentation

## 2011-08-18 DIAGNOSIS — M2560 Stiffness of unspecified joint, not elsewhere classified: Secondary | ICD-10-CM | POA: Insufficient documentation

## 2011-08-18 DIAGNOSIS — IMO0001 Reserved for inherently not codable concepts without codable children: Secondary | ICD-10-CM | POA: Insufficient documentation

## 2011-08-20 ENCOUNTER — Ambulatory Visit: Payer: 59 | Admitting: Physical Therapy

## 2011-08-26 ENCOUNTER — Ambulatory Visit: Payer: 59 | Attending: Physical Medicine and Rehabilitation | Admitting: Rehabilitation

## 2011-08-26 DIAGNOSIS — M542 Cervicalgia: Secondary | ICD-10-CM | POA: Insufficient documentation

## 2011-08-26 DIAGNOSIS — M2560 Stiffness of unspecified joint, not elsewhere classified: Secondary | ICD-10-CM | POA: Insufficient documentation

## 2011-08-26 DIAGNOSIS — IMO0001 Reserved for inherently not codable concepts without codable children: Secondary | ICD-10-CM | POA: Insufficient documentation

## 2011-08-27 ENCOUNTER — Ambulatory Visit: Payer: 59 | Admitting: Physical Therapy

## 2011-08-29 ENCOUNTER — Ambulatory Visit: Payer: 59 | Admitting: Physical Therapy

## 2011-09-01 ENCOUNTER — Ambulatory Visit: Payer: 59 | Admitting: Physical Therapy

## 2011-09-04 ENCOUNTER — Ambulatory Visit: Payer: 59 | Admitting: Rehabilitation

## 2011-09-08 ENCOUNTER — Ambulatory Visit: Payer: 59 | Admitting: Physical Therapy

## 2011-09-10 ENCOUNTER — Ambulatory Visit: Payer: 59 | Admitting: Physical Therapy

## 2011-09-12 ENCOUNTER — Encounter: Payer: 59 | Admitting: Physical Therapy

## 2011-09-15 ENCOUNTER — Ambulatory Visit: Payer: 59 | Admitting: Physical Therapy

## 2011-09-17 LAB — URINE MICROSCOPIC-ADD ON

## 2011-09-17 LAB — URINALYSIS, ROUTINE W REFLEX MICROSCOPIC
Bilirubin Urine: NEGATIVE
Hgb urine dipstick: NEGATIVE
Ketones, ur: NEGATIVE
Nitrite: NEGATIVE
pH: 7

## 2011-09-17 LAB — DIFFERENTIAL
Basophils Relative: 1
Eosinophils Absolute: 0.3
Monocytes Relative: 7
Neutrophils Relative %: 64

## 2011-09-17 LAB — CBC
Hemoglobin: 14.3
RBC: 4.63

## 2011-09-17 LAB — COMPREHENSIVE METABOLIC PANEL
ALT: 28
Alkaline Phosphatase: 89
CO2: 25
GFR calc non Af Amer: >60
Glucose, Bld: 103 — ABNORMAL HIGH
Potassium: 3.7
Sodium: 139

## 2011-09-17 LAB — SEDIMENTATION RATE: Sed Rate: 11

## 2011-09-17 LAB — MONONUCLEOSIS SCREEN: Mono Screen: NEGATIVE

## 2011-09-29 ENCOUNTER — Ambulatory Visit: Payer: 59 | Attending: Physical Medicine and Rehabilitation | Admitting: Physical Therapy

## 2011-09-29 DIAGNOSIS — IMO0001 Reserved for inherently not codable concepts without codable children: Secondary | ICD-10-CM | POA: Insufficient documentation

## 2011-09-29 DIAGNOSIS — M2560 Stiffness of unspecified joint, not elsewhere classified: Secondary | ICD-10-CM | POA: Insufficient documentation

## 2011-09-29 DIAGNOSIS — M542 Cervicalgia: Secondary | ICD-10-CM | POA: Insufficient documentation

## 2011-09-30 LAB — CBC
HCT: 42.7
Hemoglobin: 14.7
RBC: 4.83
WBC: 8.9

## 2011-09-30 LAB — BASIC METABOLIC PANEL
GFR calc Af Amer: >60
GFR calc non Af Amer: >60
Glucose, Bld: 85
Potassium: 3.3 — ABNORMAL LOW
Sodium: 139

## 2011-10-01 ENCOUNTER — Ambulatory Visit: Payer: 59 | Admitting: Physical Therapy

## 2011-10-06 ENCOUNTER — Ambulatory Visit: Payer: 59 | Admitting: Physical Therapy

## 2011-10-08 ENCOUNTER — Other Ambulatory Visit (HOSPITAL_BASED_OUTPATIENT_CLINIC_OR_DEPARTMENT_OTHER): Payer: Self-pay | Admitting: Obstetrics and Gynecology

## 2011-10-08 ENCOUNTER — Ambulatory Visit: Payer: 59 | Admitting: Physical Therapy

## 2011-10-08 DIAGNOSIS — Z1231 Encounter for screening mammogram for malignant neoplasm of breast: Secondary | ICD-10-CM

## 2011-10-10 ENCOUNTER — Ambulatory Visit (HOSPITAL_BASED_OUTPATIENT_CLINIC_OR_DEPARTMENT_OTHER)
Admission: RE | Admit: 2011-10-10 | Discharge: 2011-10-10 | Disposition: A | Discharge status: Home / Self Care | Payer: 59 | Source: Ambulatory Visit | Attending: Obstetrics and Gynecology | Admitting: Obstetrics and Gynecology

## 2011-10-10 DIAGNOSIS — R928 Other abnormal and inconclusive findings on diagnostic imaging of breast: Secondary | ICD-10-CM | POA: Insufficient documentation

## 2011-10-10 DIAGNOSIS — Z1231 Encounter for screening mammogram for malignant neoplasm of breast: Secondary | ICD-10-CM | POA: Insufficient documentation

## 2011-10-17 ENCOUNTER — Encounter: Payer: Self-pay | Admitting: Internal Medicine

## 2011-10-17 ENCOUNTER — Ambulatory Visit (INDEPENDENT_AMBULATORY_CARE_PROVIDER_SITE_OTHER): Payer: 59 | Admitting: Internal Medicine

## 2011-10-17 VITALS — BP 120/90 | HR 99 | Temp 97.8°F | Resp 18 | Ht 68.0 in | Wt 192.0 lb

## 2011-10-17 DIAGNOSIS — L989 Disorder of the skin and subcutaneous tissue, unspecified: Secondary | ICD-10-CM

## 2011-10-17 DIAGNOSIS — I10 Essential (primary) hypertension: Secondary | ICD-10-CM

## 2011-10-17 DIAGNOSIS — Z23 Encounter for immunization: Secondary | ICD-10-CM

## 2011-10-17 MED ORDER — OLMESARTAN MEDOXOMIL 20 MG PO TABS: 20.0000 mg | ORAL_TABLET | Freq: Every day | ORAL | Status: DC

## 2011-10-17 MED ORDER — TRIAMTERENE-HCTZ 37.5-25 MG PO TABS: 1.0000 | ORAL_TABLET | Freq: Every day | ORAL | Status: DC

## 2011-10-19 DIAGNOSIS — L989 Disorder of the skin and subcutaneous tissue, unspecified: Secondary | ICD-10-CM | POA: Insufficient documentation

## 2011-10-19 NOTE — Assessment & Plan Note (Signed)
Irritated skin lesion. Dermatology consult

## 2011-10-19 NOTE — Progress Notes (Signed)
  Subjective:    Patient ID: Kathy Huerta, female    DOB: May 19, 1961, 50 y.o.   MRN: [id number]  HPI Pt presents to clinic for followup of multiple medical problems. States mri of upper post back was nl and ST prominence improves with PT. Has mole on Huerta lower abd that is persistently irritated. Bleeds periodically and never heals entirely. Is irritated by pants waistline. Stopped benicar for one month due to dizziness and sbp's in the 90 range. Dizziness resolved and bp minimally high today. No other complaints.  Past Medical History  Diagnosis Date  . DDD (degenerative disc disease)     on disability  . HTN (hypertension)   . Depression   . Heartburn    Past Surgical History  Procedure Date  . Anterior fusion cervical spine 2008    C6-7  Dr Wynetta Emery  . Posterior fusion cervical spine 2011    C6-7    reports that she has quit smoking. She has never used smokeless tobacco. She reports that she drinks alcohol. She reports that she does not use illicit drugs. family history includes Hyperlipidemia in her mother; Hypertension in her brother and mother; and Other in her father. Allergies  Allergen Reactions  . Azithromycin     REACTION: Throat Swelling  . Cefazolin     REACTION: Throat Swelling  . Clarithromycin     REACTION: Throat Swelling  . WUJ:WJXBJYNWGNF+AOZHYQMVH+QIONGEXBMW Acid+Aspartame     REACTION: Throat swelling.  . Moxifloxacin     REACTION: Throat Swelling  . Penicillins     REACTION: throat swelling  . Sulfonamide Derivatives      Review of Systems     Objective:   Physical Exam  Nursing note and vitals reviewed. Constitutional: She appears well-developed and well-nourished. No distress.  HENT:  Head: Normocephalic and atraumatic.  Eyes: Conjunctivae are normal. No scleral icterus.  Neurological: She is alert.  Skin: Skin is warm and dry. No rash noted. She is not diaphoretic.       Huerta lower abd: <1cm skin lesion irritated. Sl scabbing.   Psychiatric:  She has a normal mood and affect.          Assessment & Plan:

## 2011-10-19 NOTE — Assessment & Plan Note (Signed)
Isolated elevation. outpt bp log. If elevated then resume benicar but at 10mg  qd.

## 2011-10-27 ENCOUNTER — Ambulatory Visit: Payer: 59 | Attending: Physical Medicine and Rehabilitation | Admitting: Physical Therapy

## 2011-10-27 DIAGNOSIS — M542 Cervicalgia: Secondary | ICD-10-CM | POA: Insufficient documentation

## 2011-10-27 DIAGNOSIS — IMO0001 Reserved for inherently not codable concepts without codable children: Secondary | ICD-10-CM | POA: Insufficient documentation

## 2011-10-27 DIAGNOSIS — M2560 Stiffness of unspecified joint, not elsewhere classified: Secondary | ICD-10-CM | POA: Insufficient documentation

## 2011-10-29 ENCOUNTER — Other Ambulatory Visit: Payer: Self-pay | Admitting: Obstetrics and Gynecology

## 2011-10-29 DIAGNOSIS — R928 Other abnormal and inconclusive findings on diagnostic imaging of breast: Secondary | ICD-10-CM

## 2011-10-30 ENCOUNTER — Ambulatory Visit
Admission: RE | Admit: 2011-10-30 | Discharge: 2011-10-30 | Disposition: A | Discharge status: Home / Self Care | Payer: 59 | Source: Ambulatory Visit | Attending: Obstetrics and Gynecology | Admitting: Obstetrics and Gynecology

## 2011-10-30 DIAGNOSIS — R928 Other abnormal and inconclusive findings on diagnostic imaging of breast: Secondary | ICD-10-CM

## 2011-11-03 ENCOUNTER — Encounter: Payer: 59 | Admitting: Physical Therapy

## 2011-11-05 ENCOUNTER — Encounter: Payer: 59 | Admitting: Physical Therapy

## 2011-11-07 ENCOUNTER — Ambulatory Visit: Payer: 59 | Admitting: Physical Therapy

## 2011-11-17 ENCOUNTER — Ambulatory Visit: Payer: 59 | Admitting: Physical Therapy

## 2011-11-17 DIAGNOSIS — C4491 Basal cell carcinoma of skin, unspecified: Secondary | ICD-10-CM

## 2011-11-17 HISTORY — DX: Basal cell carcinoma of skin, unspecified: C44.91

## 2011-11-24 ENCOUNTER — Encounter: Payer: Self-pay | Admitting: Family

## 2011-11-24 ENCOUNTER — Ambulatory Visit: Payer: 59 | Attending: Physical Medicine and Rehabilitation | Admitting: Physical Therapy

## 2011-11-24 DIAGNOSIS — IMO0001 Reserved for inherently not codable concepts without codable children: Secondary | ICD-10-CM | POA: Insufficient documentation

## 2011-11-24 DIAGNOSIS — M542 Cervicalgia: Secondary | ICD-10-CM | POA: Insufficient documentation

## 2011-11-24 DIAGNOSIS — M2560 Stiffness of unspecified joint, not elsewhere classified: Secondary | ICD-10-CM | POA: Insufficient documentation

## 2011-11-25 ENCOUNTER — Ambulatory Visit (INDEPENDENT_AMBULATORY_CARE_PROVIDER_SITE_OTHER): Payer: 59 | Admitting: Internal Medicine

## 2011-11-25 ENCOUNTER — Other Ambulatory Visit: Payer: Self-pay | Admitting: Internal Medicine

## 2011-11-25 ENCOUNTER — Encounter: Payer: Self-pay | Admitting: Internal Medicine

## 2011-11-25 VITALS — BP 120/72 | HR 98 | Temp 97.9°F | Resp 18 | Wt 189.0 lb

## 2011-11-25 DIAGNOSIS — J029 Acute pharyngitis, unspecified: Secondary | ICD-10-CM

## 2011-11-25 MED ORDER — DOXYCYCLINE HYCLATE 100 MG PO TABS: 100.0000 mg | ORAL_TABLET | Freq: Two times a day (BID) | ORAL | Status: AC

## 2011-11-25 NOTE — Progress Notes (Signed)
  Subjective:    Patient ID: Kathy Huerta, female    DOB: [birth year], 50 y.o.   MRN: [id number]  HPI Pt presents to clinic for evaluation of sore throat. Notes 1d h/o ST. +strep exposure with husband. Reviewed multiple abx allergies. No f/c or difficulty swallowing/controlling secretions. No alleviating or exacerbating factors.  Past Medical History  Diagnosis Date  . DDD (degenerative disc disease)     on disability  . HTN (hypertension)   . Depression   . Heartburn   . Basal cell carcinoma 11/17/11    Dr. Naoma Diener   Past Surgical History  Procedure Date  . Anterior fusion cervical spine 2008    C6-7  Dr Wynetta Emery  . Posterior fusion cervical spine 2011    C6-7    reports that she has quit smoking. She has never used smokeless tobacco. She reports that she drinks alcohol. She reports that she does not use illicit drugs. family history includes Hyperlipidemia in her mother; Hypertension in her brother and mother; and Other in her father. Allergies  Allergen Reactions  . Azithromycin     REACTION: Throat Swelling  . Cefazolin     REACTION: Throat Swelling  . Clarithromycin     REACTION: Throat Swelling  . WUJ:WJXBJYNWGNF+AOZHYQMVH+QIONGEXBMW Acid+Aspartame     REACTION: Throat swelling.  . Moxifloxacin     REACTION: Throat Swelling  . Penicillins     REACTION: throat swelling  . Sulfonamide Derivatives      Review of Systems see hpi ]     Objective:   Physical Exam  Nursing note and vitals reviewed. Constitutional: She appears well-developed and well-nourished. No distress.  HENT:  Head: Normocephalic and atraumatic.  Huerta Ear: Tympanic membrane, external ear and ear canal normal.  Left Ear: Tympanic membrane, external ear and ear canal normal.  Nose: Nose normal.  Mouth/Throat: Uvula is midline and mucous membranes are normal. Posterior oropharyngeal erythema present. No oropharyngeal exudate, posterior oropharyngeal edema or tonsillar abscesses.  Eyes: Conjunctivae  are normal. Huerta eye exhibits no discharge. Left eye exhibits no discharge. No scleral icterus.  Neck: Neck supple.  Lymphadenopathy:    She has no cervical adenopathy.  Neurological: She is alert.  Skin: Skin is warm and dry. She is not diaphoretic.  Psychiatric: She has a normal mood and affect.          Assessment & Plan:

## 2011-11-27 LAB — CULTURE, GROUP A STREP

## 2011-11-29 DIAGNOSIS — J029 Acute pharyngitis, unspecified: Secondary | ICD-10-CM | POA: Insufficient documentation

## 2011-11-29 NOTE — Assessment & Plan Note (Signed)
Rapid strep obtained and neg. Send swab for culture. Avoiding empiric abx due to multiple abx allergies

## 2011-12-05 ENCOUNTER — Ambulatory Visit: Payer: 59 | Admitting: Physical Therapy

## 2011-12-08 ENCOUNTER — Ambulatory Visit: Payer: 59 | Admitting: Physical Therapy

## 2012-03-09 ENCOUNTER — Other Ambulatory Visit: Payer: Self-pay | Admitting: Obstetrics and Gynecology

## 2012-03-09 ENCOUNTER — Other Ambulatory Visit (HOSPITAL_COMMUNITY)
Admission: RE | Admit: 2012-03-09 | Discharge: 2012-03-09 | Disposition: A | Discharge status: Home / Self Care | Payer: 59 | Source: Ambulatory Visit | Attending: Obstetrics and Gynecology | Admitting: Obstetrics and Gynecology

## 2012-03-09 DIAGNOSIS — Z01419 Encounter for gynecological examination (general) (routine) without abnormal findings: Secondary | ICD-10-CM | POA: Insufficient documentation

## 2012-04-08 ENCOUNTER — Telehealth: Payer: Self-pay | Admitting: Family

## 2012-04-08 MED ORDER — SERTRALINE HCL 100 MG PO TABS: 100.0000 mg | ORAL_TABLET | ORAL | Status: DC

## 2012-04-08 NOTE — Telephone Encounter (Signed)
Please advise re: refills and when pt should follow up in the office?

## 2012-04-08 NOTE — Telephone Encounter (Signed)
#  30 x no refills sent to pharmacy. Please call pt to arrange follow up before further refills are needed.

## 2012-04-08 NOTE — Telephone Encounter (Signed)
Informed patient that a 30 day supply of sertraline has been sent to pharmacy. She states that she usually see's Dr. Rodena Medin. She wants to start seeing dr. Rodena Medin from now on. She has an appointment on 04/12/12 with him.

## 2012-04-08 NOTE — Telephone Encounter (Signed)
Ok  To give 30 day supply, no refills. Needs follow up pls.

## 2012-04-08 NOTE — Telephone Encounter (Signed)
Refill-sertraline hcl 100mg  tab. Take one tablet daily. Qty 90 last fill 1.15.13

## 2012-04-12 ENCOUNTER — Encounter: Payer: Self-pay | Admitting: Internal Medicine

## 2012-04-12 ENCOUNTER — Ambulatory Visit (INDEPENDENT_AMBULATORY_CARE_PROVIDER_SITE_OTHER): Payer: 59 | Admitting: Internal Medicine

## 2012-04-12 ENCOUNTER — Other Ambulatory Visit: Payer: Self-pay | Admitting: Internal Medicine

## 2012-04-12 VITALS — BP 92/64 | HR 93 | Temp 98.4°F | Ht 68.0 in | Wt 181.0 lb

## 2012-04-12 DIAGNOSIS — E785 Hyperlipidemia, unspecified: Secondary | ICD-10-CM | POA: Insufficient documentation

## 2012-04-12 DIAGNOSIS — IMO0001 Reserved for inherently not codable concepts without codable children: Secondary | ICD-10-CM

## 2012-04-12 DIAGNOSIS — I10 Essential (primary) hypertension: Secondary | ICD-10-CM

## 2012-04-12 DIAGNOSIS — Z1211 Encounter for screening for malignant neoplasm of colon: Secondary | ICD-10-CM

## 2012-04-12 DIAGNOSIS — M791 Myalgia, unspecified site: Secondary | ICD-10-CM | POA: Insufficient documentation

## 2012-04-12 DIAGNOSIS — Z79899 Other long term (current) drug therapy: Secondary | ICD-10-CM

## 2012-04-12 MED ORDER — SERTRALINE HCL 100 MG PO TABS: 100.0000 mg | ORAL_TABLET | ORAL | Status: DC

## 2012-04-12 NOTE — Patient Instructions (Signed)
Please schedule fasting labs for this Weds morning (4/24) Cbc, tsh- 401.9, chem7-v58.69 and lipid/lft 272.4

## 2012-04-12 NOTE — Assessment & Plan Note (Signed)
Obtain vitamin d level 

## 2012-04-12 NOTE — Progress Notes (Signed)
  Subjective:    Patient ID: Kathy Huerta, female    DOB: Feb 05, 1961, 51 y.o.   MRN: [id number]  HPI Pt presents to clinic for followup of multiple medical problems. BP noted to be sbp ~92 auscultation and palpation by radial artery. Currently asx but has noted recent dizziness without syncope. +thirst. Last colonoscopy little over 10 years. Has intermittent myalgias without triggers.   Past Medical History  Diagnosis Date  . DDD (degenerative disc disease)     on disability  . HTN (hypertension)   . Depression   . Heartburn   . Basal cell carcinoma 11/17/11    Dr. Naoma Diener   Past Surgical History  Procedure Date  . Anterior fusion cervical spine 2008    C6-7  Dr Wynetta Emery  . Posterior fusion cervical spine 2011    C6-7    reports that she has quit smoking. She has never used smokeless tobacco. She reports that she drinks alcohol. She reports that she does not use illicit drugs. family history includes Hyperlipidemia in her mother; Hypertension in her brother and mother; and Other in her father. Allergies  Allergen Reactions  . Azithromycin     REACTION: Throat Swelling  . Cefazolin     REACTION: Throat Swelling  . Clarithromycin     REACTION: Throat Swelling  . JYN:WGNFAOZHYQM+VHQIONGEX+BMWUXLKGMW Acid+Aspartame     REACTION: Throat swelling.  . Moxifloxacin     REACTION: Throat Swelling  . Penicillins     REACTION: throat swelling  . Sulfonamide Derivatives       Review of Systems see hpi     Objective:   Physical Exam  Nursing note and vitals reviewed. Constitutional: She appears well-developed and well-nourished. No distress.  HENT:  Head: Normocephalic and atraumatic.  Huerta Ear: External ear normal.  Left Ear: External ear normal.  Eyes: Conjunctivae are normal. No scleral icterus.  Cardiovascular: Normal rate, regular rhythm and normal heart sounds.  Exam reveals no gallop and no friction rub.   No murmur heard. Neurological: She is alert.  Skin: She is not  diaphoretic.  Psychiatric: She has a normal mood and affect.          Assessment & Plan:

## 2012-04-12 NOTE — Assessment & Plan Note (Signed)
Obtain lipid/lft. 

## 2012-04-12 NOTE — Assessment & Plan Note (Signed)
Mildly low control but intermittently symptomatic. Dc benicar. Take 1/2 dose diuretic qd. Increase fluid intake. Monitor bp and call results to clinic for review. Obtain cbc, chem7

## 2012-04-13 ENCOUNTER — Encounter: Payer: Self-pay | Admitting: Internal Medicine

## 2012-04-14 LAB — CBC
MCH: 30.1 pg (ref 26.0–34.0)
MCHC: 33.7 g/dL (ref 30.0–36.0)
MCV: 89.2 fL (ref 78.0–100.0)
Platelets: 290 10*3/uL (ref 150–400)
RDW: 12.6 % (ref 11.5–15.5)
WBC: 6.5 10*3/uL (ref 4.0–10.5)

## 2012-04-14 LAB — BASIC METABOLIC PANEL
BUN: 12 mg/dL (ref 6–23)
CO2: 27 mEq/L (ref 19–32)
Calcium: 9.4 mg/dL (ref 8.4–10.5)
Chloride: 104 mEq/L (ref 96–112)
Creat: 0.6 mg/dL (ref 0.50–1.10)

## 2012-04-14 LAB — LIPID PANEL
LDL Cholesterol: 162 mg/dL — ABNORMAL HIGH (ref 0–99)
Triglycerides: 183 mg/dL — ABNORMAL HIGH (ref <150)

## 2012-04-14 LAB — TSH: TSH: 3.4 u[IU]/mL (ref 0.350–4.500)

## 2012-04-15 ENCOUNTER — Telehealth: Payer: Self-pay | Admitting: *Deleted

## 2012-04-15 LAB — VITAMIN D 25 HYDROXY (VIT D DEFICIENCY, FRACTURES): Vit D, 25-Hydroxy: 60 ng/mL (ref 30–89)

## 2012-04-15 NOTE — Telephone Encounter (Signed)
bp was low. Now normal. Continue what she's doing. (off benicar and 1/2 dose diuretic). Enjoy vacation

## 2012-04-15 NOTE — Telephone Encounter (Signed)
Call placed to patient at [phone], she was informed per Dr Rodena Medin instructions and has verbalized understanding.

## 2012-04-15 NOTE — Telephone Encounter (Signed)
Report received in nursing in box with note from patient with the following blood pressure readings.  4/22 9 pm  117/73 4/23 6 am  126/77  4/23 3 pm 119/78

## 2012-04-16 ENCOUNTER — Other Ambulatory Visit: Payer: Self-pay | Admitting: Internal Medicine

## 2012-04-16 DIAGNOSIS — E785 Hyperlipidemia, unspecified: Secondary | ICD-10-CM

## 2012-04-26 ENCOUNTER — Ambulatory Visit: Payer: 59 | Admitting: Internal Medicine

## 2012-04-26 ENCOUNTER — Encounter: Payer: Self-pay | Admitting: Internal Medicine

## 2012-04-26 ENCOUNTER — Ambulatory Visit (INDEPENDENT_AMBULATORY_CARE_PROVIDER_SITE_OTHER): Payer: 59 | Admitting: Internal Medicine

## 2012-04-26 VITALS — BP 118/74 | HR 96 | Temp 97.6°F | Resp 18 | Wt 186.0 lb

## 2012-04-26 DIAGNOSIS — M549 Dorsalgia, unspecified: Secondary | ICD-10-CM

## 2012-04-26 MED ORDER — KETOROLAC TROMETHAMINE 30 MG/ML IJ SOLN
30.0000 mg | Freq: Once | INTRAMUSCULAR | Status: AC
  Administered 2012-04-26: 30 mg via INTRAMUSCULAR

## 2012-04-26 MED ORDER — MELOXICAM 7.5 MG PO TABS: ORAL_TABLET | ORAL | Status: AC

## 2012-04-26 NOTE — Assessment & Plan Note (Signed)
Given toradol im injxn. Begin mobic with food and no other nsaids. Continue muscle relaxer and percocet under direction of pain clinic. Consider PT if sx's do not improve.

## 2012-04-26 NOTE — Progress Notes (Signed)
  Subjective:    Patient ID: Kathy Huerta, female    DOB: 1961/10/23, 51 y.o.   MRN: [id number]  HPI Pt presents to clinic for evaluation of back pain. Notes 6-7 day h/o bilateral low back pain that radiates slightly to left upper leg. No paresthesias, leg weakness or urinary incontinence. No specific injury but began after walking on the beach. Currently taking muscle relaxer and percoet under the direction of the pain clinic. Pain worsens with position change. No other complaints.  Past Medical History  Diagnosis Date  . DDD (degenerative disc disease)     on disability  . HTN (hypertension)   . Depression   . Heartburn   . Basal cell carcinoma 11/17/11    Dr. Naoma Diener   Past Surgical History  Procedure Date  . Anterior fusion cervical spine 2008    C6-7  Dr Wynetta Emery  . Posterior fusion cervical spine 2011    C6-7    reports that she has quit smoking. She has never used smokeless tobacco. She reports that she drinks alcohol. She reports that she does not use illicit drugs. family history includes Hyperlipidemia in her mother; Hypertension in her brother and mother; and Other in her father. Allergies  Allergen Reactions  . Amoxicillin-Pot Clavulanate     REACTION: Throat swelling.  . Azithromycin     REACTION: Throat Swelling  . Cefazolin     REACTION: Throat Swelling  . Clarithromycin     REACTION: Throat Swelling  . Moxifloxacin     REACTION: Throat Swelling  . Penicillins     REACTION: throat swelling  . Sulfonamide Derivatives      Review of Systems see hpi     Objective:   Physical Exam  Nursing note and vitals reviewed. Constitutional: She appears well-developed and well-nourished. No distress.  HENT:  Head: Normocephalic and atraumatic.  Musculoskeletal:       Mild tenderness to palpation involving bilateral lower paraspinal muscles. +spasm bilaterally. No bony tenderness.bilateral lower extremity strength 5/5. Gait nl.  Neurological: She is alert.  Skin: Skin  is warm and dry. She is not diaphoretic.  Psychiatric: She has a normal mood and affect.          Assessment & Plan:

## 2012-05-03 ENCOUNTER — Ambulatory Visit (INDEPENDENT_AMBULATORY_CARE_PROVIDER_SITE_OTHER): Payer: 59 | Admitting: Internal Medicine

## 2012-05-03 ENCOUNTER — Encounter: Payer: Self-pay | Admitting: Internal Medicine

## 2012-05-03 VITALS — BP 132/100 | HR 99 | Temp 98.2°F | Resp 20

## 2012-05-03 DIAGNOSIS — I10 Essential (primary) hypertension: Secondary | ICD-10-CM

## 2012-05-03 DIAGNOSIS — J069 Acute upper respiratory infection, unspecified: Secondary | ICD-10-CM | POA: Insufficient documentation

## 2012-05-03 MED ORDER — BENZONATATE 100 MG PO CAPS: 100.0000 mg | ORAL_CAPSULE | Freq: Three times a day (TID) | ORAL | Status: AC | PRN

## 2012-05-03 NOTE — Assessment & Plan Note (Signed)
Attempt tessalon prn cough. Hold abx and begin if sx's do not improve after total duration of 8-10 days. (has filled course of doxycycline at home from previous visit.) Followup if no improvement or worsening.

## 2012-05-03 NOTE — Progress Notes (Signed)
  Subjective:    Patient ID: Kathy Huerta, female    DOB: 03-Nov-1961, 51 y.o.   MRN: [id number]  HPI Pt presents to clinic for evaluation of cough. Notes 6 day h/o hoarseness, cough productive for unknown color sputum and chest congestion. No known fever or chills. Taking mucinex with some improvement. No other alleviating or exacerbating factors.   Past Medical History  Diagnosis Date  . DDD (degenerative disc disease)     on disability  . HTN (hypertension)   . Depression   . Heartburn   . Basal cell carcinoma 11/17/11    Dr. Naoma Diener   Past Surgical History  Procedure Date  . Anterior fusion cervical spine 2008    C6-7  Dr Wynetta Emery  . Posterior fusion cervical spine 2011    C6-7    reports that she has quit smoking. She has never used smokeless tobacco. She reports that she drinks alcohol. She reports that she does not use illicit drugs. family history includes Hyperlipidemia in her mother; Hypertension in her brother and mother; and Other in her father. Allergies  Allergen Reactions  . Amoxicillin-Pot Clavulanate     REACTION: Throat swelling.  . Azithromycin     REACTION: Throat Swelling  . Cefazolin     REACTION: Throat Swelling  . Clarithromycin     REACTION: Throat Swelling  . Moxifloxacin     REACTION: Throat Swelling  . Penicillins     REACTION: throat swelling  . Sulfonamide Derivatives      Review of Systems see hpi     Objective:   Physical Exam  Nursing note and vitals reviewed. Constitutional: She appears well-developed and well-nourished. No distress.  HENT:  Head: Normocephalic and atraumatic.  Huerta Ear: External ear normal.  Left Ear: External ear normal.  Nose: Nose normal.  Mouth/Throat: Oropharynx is clear and moist. No oropharyngeal exudate.  Eyes: Conjunctivae are normal. Huerta eye exhibits no discharge. Left eye exhibits no discharge. No scleral icterus.  Neck: Neck supple.  Pulmonary/Chest: Effort normal and breath sounds normal. No  respiratory distress. She has no wheezes. She has no rales.  Neurological: She is alert.  Skin: Skin is warm and dry. She is not diaphoretic.  Psychiatric: She has a normal mood and affect.          Assessment & Plan:

## 2012-05-03 NOTE — Assessment & Plan Note (Signed)
Isolated elevation with URI. Recommend monitor bp as outpt and notify clinic if remains elevated.

## 2012-05-04 ENCOUNTER — Ambulatory Visit: Payer: 59 | Admitting: Internal Medicine

## 2012-05-07 ENCOUNTER — Ambulatory Visit (AMBULATORY_SURGERY_CENTER): Payer: 59 | Admitting: *Deleted

## 2012-05-07 ENCOUNTER — Encounter: Payer: Self-pay | Admitting: Internal Medicine

## 2012-05-07 VITALS — Ht 67.5 in | Wt 181.2 lb

## 2012-05-07 DIAGNOSIS — Z1211 Encounter for screening for malignant neoplasm of colon: Secondary | ICD-10-CM

## 2012-05-07 MED ORDER — PEG-KCL-NACL-NASULF-NA ASC-C 100 G PO SOLR: ORAL | Status: DC

## 2012-05-21 ENCOUNTER — Encounter: Payer: Self-pay | Admitting: Internal Medicine

## 2012-05-21 ENCOUNTER — Ambulatory Visit (AMBULATORY_SURGERY_CENTER): Payer: 59 | Admitting: Internal Medicine

## 2012-05-21 VITALS — BP 140/84 | HR 85 | Temp 98.5°F | Resp 20 | Ht 67.5 in | Wt 181.0 lb

## 2012-05-21 DIAGNOSIS — D126 Benign neoplasm of colon, unspecified: Secondary | ICD-10-CM

## 2012-05-21 DIAGNOSIS — Z1211 Encounter for screening for malignant neoplasm of colon: Secondary | ICD-10-CM

## 2012-05-21 MED ORDER — SODIUM CHLORIDE 0.9 % IV SOLN: 500.0000 mL | INTRAVENOUS | Status: DC

## 2012-05-21 NOTE — Op Note (Signed)
[facility] [address] [place], [place]  [address]  COLONOSCOPY PROCEDURE REPORT  PATIENT:  Kathy Huerta, Kathy Huerta  MR#:  [id number] BIRTHDATE:  09/02/1961, 51 yrs. old  GENDER:  female ENDOSCOPIST:  Carie Caddy. Chanta Bauers, MD REF. BY:  Charlynn Court, M.D. PROCEDURE DATE:  05/21/2012 PROCEDURE:  Colon with cold biopsy polypectomy ASA CLASS:  Class II INDICATIONS:  Routine Risk Screening, 1st colonoscopy MEDICATIONS:   MAC sedation, administered by CRNA, propofol (Diprivan) 600 mg IV  DESCRIPTION OF PROCEDURE:   After the risks benefits and alternatives of the procedure were thoroughly explained, informed consent was obtained.  Digital rectal exam was performed and revealed no rectal masses.   The LB CF-[id number] endoscope was introduced through the anus and advanced to the terminal ileum which was intubated for a short distance, without limitations. The quality of the prep was Suprep good.  The instrument was then slowly withdrawn as the colon was fully examined. <<PROCEDUREIMAGES>> FINDINGS:  Six sessile, small, < 5 mm, polyps were found in the transverse colon, descending colon and rectum. The polyps were removed using cold biopsy forceps.  This was otherwise a normal examination of the colon.   Retroflexed views in the rectum revealed internal hemorrhoids.  The scope was then withdrawn from the cecum and the procedure completed.  COMPLICATIONS:  None  ENDOSCOPIC IMPRESSION: 1) Six polyps in the transverse colon, descending colon and rectum.  Removed and sent to pathology. 2) Otherwise normal examination 3) Small internal hemorrhoids  RECOMMENDATIONS: 1) Await pathology results 2) If the polyps removed today are proven to be adenomatous (pre-cancerous) polyps, you will need a colonoscopy in 3 years. Otherwise you should continue to follow colorectal cancer screening guidelines for "routine risk" patients with a colonoscopy in 10 years. You will receive a letter within  1-2 weeks with the results of your biopsy as well as final recommendations. Please call my office if you have not received a letter after 3 weeks.  Carie Caddy. Rhea Belton, MD  CC:  The Patient Charlynn Court MD  n. eSIGNEDCarie Caddy. Sherion Dooly at 05/21/2012 11:16 AM  Ashley Akin, [phone]

## 2012-05-21 NOTE — Patient Instructions (Addendum)
YOU HAD AN ENDOSCOPIC PROCEDURE TODAY AT THE [facility]: Refer to the procedure report that was given to you for any specific questions about what was found during the examination.  If the procedure report does not answer your questions, please call your gastroenterologist to clarify.  If you requested that your care partner not be given the details of your procedure findings, then the procedure report has been included in a sealed envelope for you to review at your convenience later.  YOU SHOULD EXPECT: Some feelings of bloating in the abdomen. Passage of more gas than usual.  Walking can help get rid of the air that was put into your GI tract during the procedure and reduce the bloating. If you had a lower endoscopy (such as a colonoscopy or flexible sigmoidoscopy) you may notice spotting of blood in your stool or on the toilet paper. If you underwent a bowel prep for your procedure, then you may not have a normal bowel movement for a few days.  DIET: Your first meal following the procedure should be a light meal and then it is ok to progress to your normal diet.  A half-sandwich or bowl of soup is an example of a good first meal.  Heavy or fried foods are harder to digest and may make you feel nauseous or bloated.  Likewise meals heavy in dairy and vegetables can cause extra gas to form and this can also increase the bloating.  Drink plenty of fluids but you should avoid alcoholic beverages for 24 hours.  ACTIVITY: Your care partner should take you home directly after the procedure.  You should plan to take it easy, moving slowly for the rest of the day.  You can resume normal activity the day after the procedure however you should NOT DRIVE or use heavy machinery for 24 hours (because of the sedation medicines used during the test).    SYMPTOMS TO REPORT IMMEDIATELY: A gastroenterologist can be reached at any hour.  During normal business hours, 8:30 AM to 5:00 PM Monday through Friday,  call [phone].  After hours and on weekends, please call the GI answering service at [phone] who will take a message and have the physician on call contact you.   Following lower endoscopy (colonoscopy or flexible sigmoidoscopy):  Excessive amounts of blood in the stool  Significant tenderness or worsening of abdominal pains  Swelling of the abdomen that is new, acute  Fever of 100F or higher    FOLLOW UP: If any biopsies were taken you will be contacted by phone or by letter within the next 1-3 weeks.  Call your gastroenterologist if you have not heard about the biopsies in 3 weeks.  Our staff will call the home number listed on your records the next business day following your procedure to check on you and address any questions or concerns that you may have at that time regarding the information given to you following your procedure. This is a courtesy call and so if there is no answer at the home number and we have not heard from you through the emergency physician on call, we will assume that you have returned to your regular daily activities without incident.  SIGNATURES/CONFIDENTIALITY: You and/or your care partner have signed paperwork which will be entered into your electronic medical record.  These signatures attest to the fact that that the information above on your After Visit Summary has been reviewed and is understood.  Full responsibility of the confidentiality   of this discharge information lies with you and/or your care-partner.     

## 2012-05-21 NOTE — Progress Notes (Signed)
Patient did not experience any of the following events: a burn prior to discharge; a fall within the facility; wrong site/side/patient/procedure/implant event; or a hospital transfer or hospital admission upon discharge from the facility. (G8907) Patient did not have preoperative order for IV antibiotic SSI prophylaxis. (G8918)  

## 2012-05-24 ENCOUNTER — Telehealth: Payer: Self-pay | Admitting: *Deleted

## 2012-05-24 NOTE — Telephone Encounter (Signed)
  Follow up Call-  Call back number 05/21/2012  Post procedure Call Back phone  # [phone]  Permission to leave phone message Yes     Patient questions:  Do you have a fever, pain , or abdominal swelling? no Pain Score  0 *  Have you tolerated food without any problems? yes  Have you been able to return to your normal activities? yes  Do you have any questions about your discharge instructions: Diet   no Medications  no Follow up visit  no  Do you have questions or concerns about your Care? no  Actions: * If pain score is 4 or above: No action needed, pain <4.

## 2012-05-29 ENCOUNTER — Encounter: Payer: Self-pay | Admitting: Internal Medicine

## 2012-09-20 ENCOUNTER — Other Ambulatory Visit: Payer: Self-pay | Admitting: Internal Medicine

## 2012-09-21 MED ORDER — TRIAMTERENE-HCTZ 37.5-25 MG PO TABS: 0.5000 | ORAL_TABLET | Freq: Every day | ORAL | Status: DC

## 2012-11-22 IMAGING — CT CT CERVICAL SPINE W/O CM
3 of 4 series · 13 of 33 positions shown, 16 images · non-contrast
Comparison: Cervical MRI 08/25/2009, radiographs 12/03/2010

CLINICAL DATA: Cervical fusion.  Neck pain.  Swelling left scapular
region.

CT CERVICAL SPINE WITHOUT CONTRAST
TECHNIQUE: Multidetector CT imaging of the cervical spine was
performed. Multiplanar CT image reconstructions were also
generated.

[Series 4: c_spine 2.0 b41s st · axial · 0.28mm/px · z∈[-201,-71]mm · 5 of 99 slices shown, 7 images]
[im 17/99  soft-tissue]
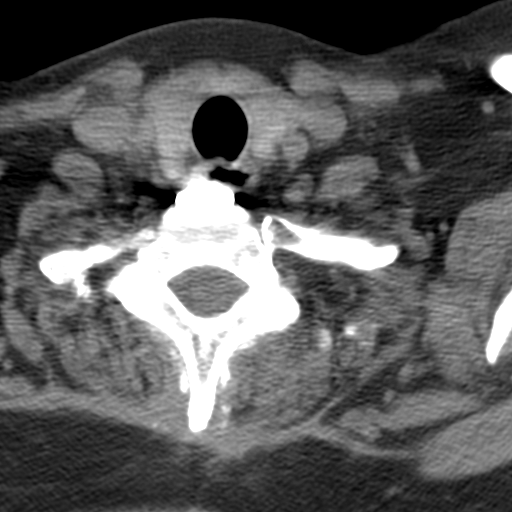
[im 17/99  bone]
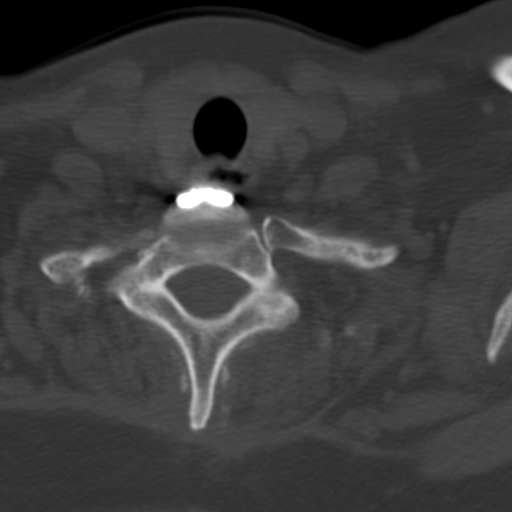
[im 33/99  bone]
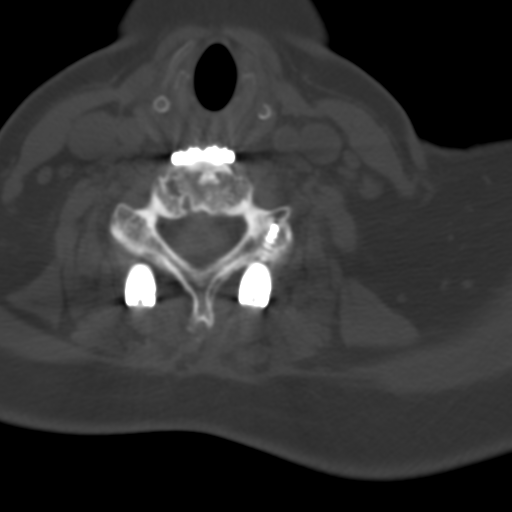
[im 50/99  bone]
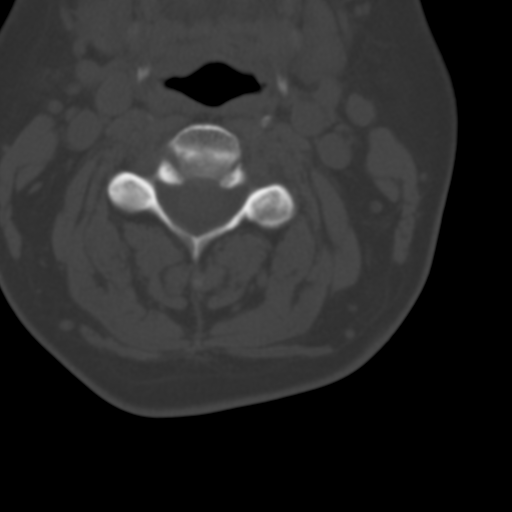
[im 66/99  bone]
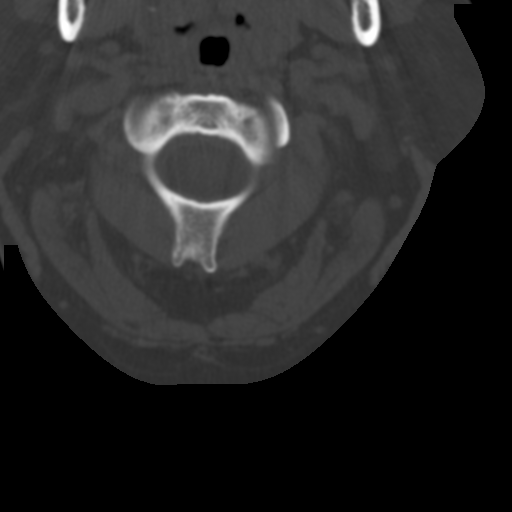
[im 82/99  soft-tissue]
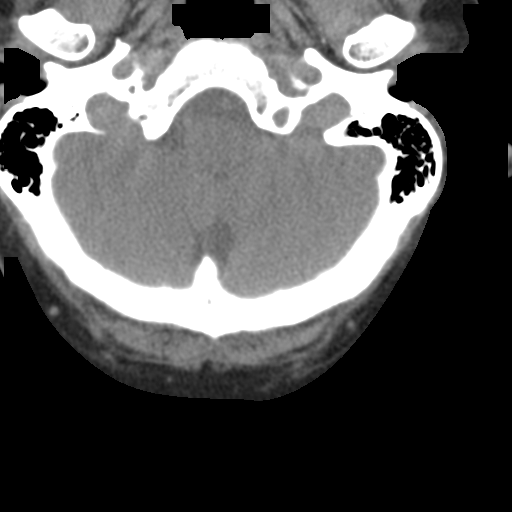
[im 82/99  bone]
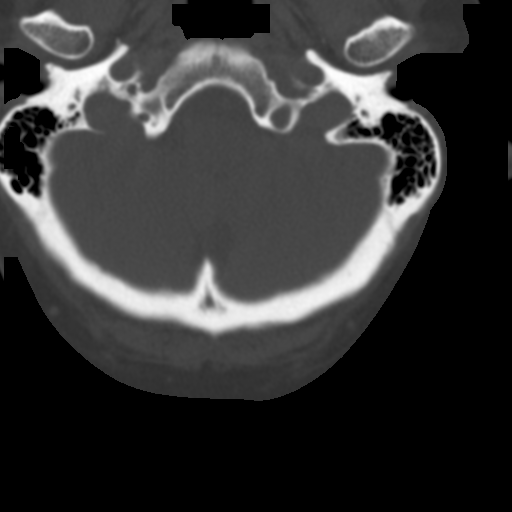

[Series 7: c_spine 2.0 coronal · coronal · 0.29mm/px · 3 of 49 slices shown]
[im 10/49  bone]
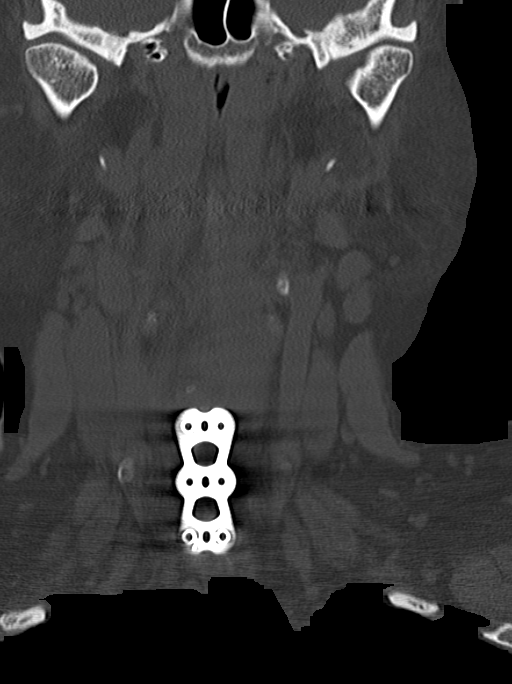
[im 20/49  bone]
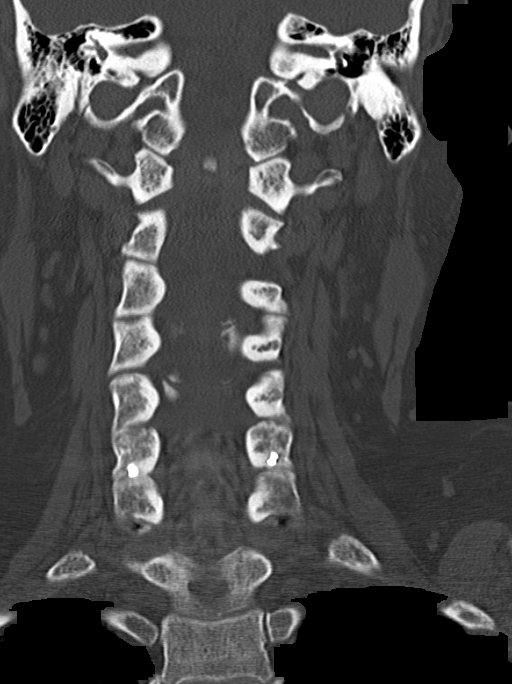
[im 29/49  bone]
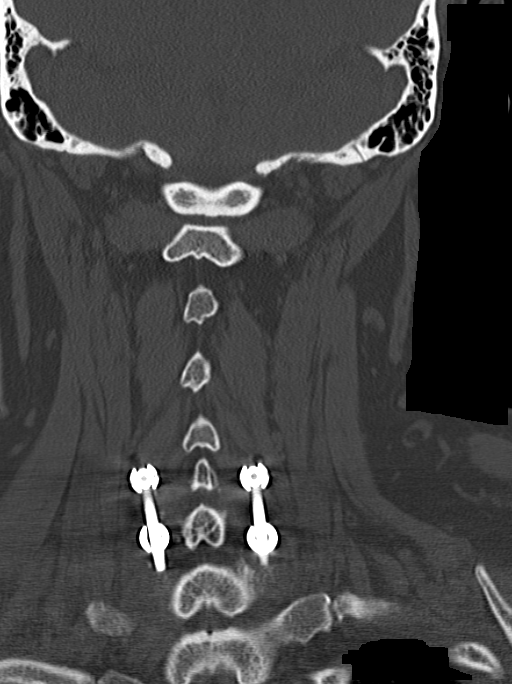

[Series 8: c_spine 2.0 sagittal · sagittal · 0.28mm/px · 5 of 60 slices shown, 6 images]
[im 20/60  bone]
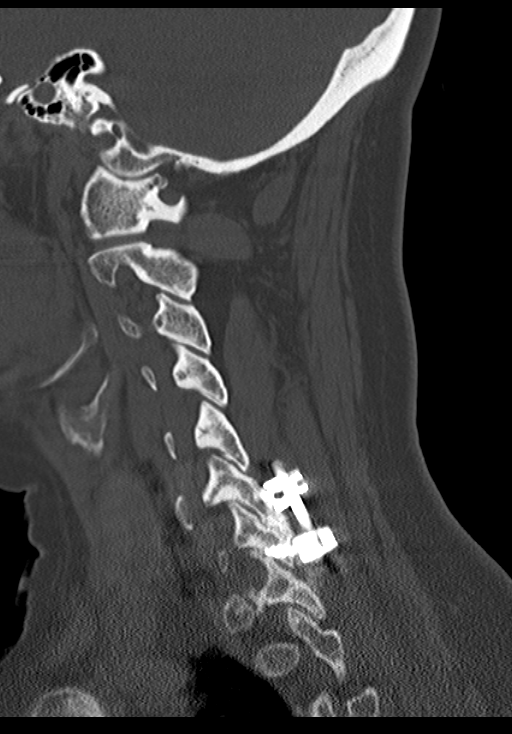
[im 25/60  bone]
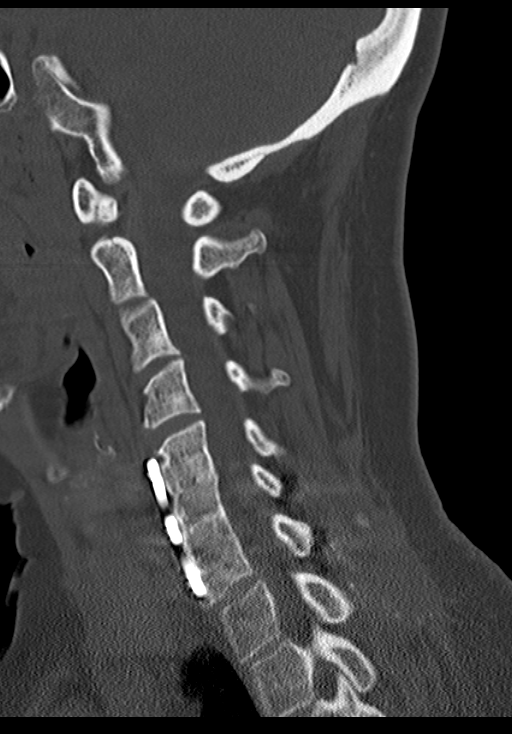
[im 30/60  soft-tissue]
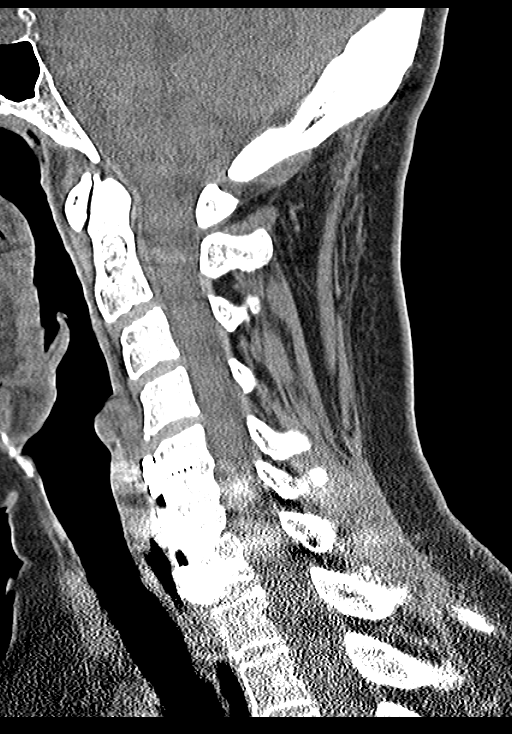
[im 30/60  bone]
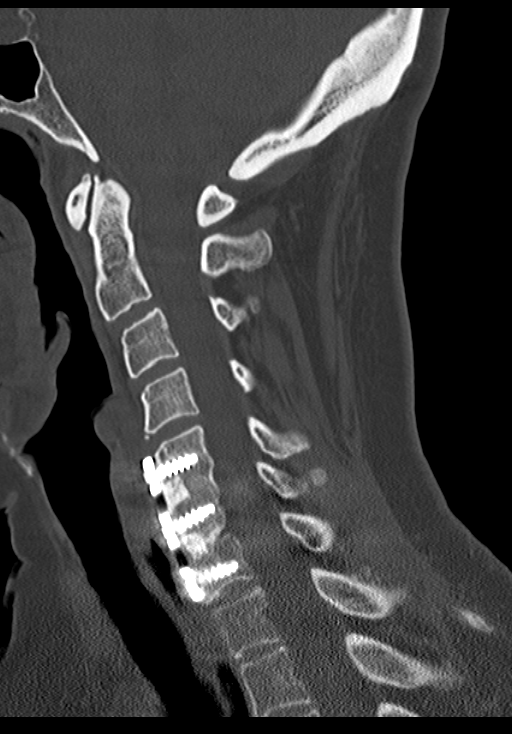
[im 35/60  bone]
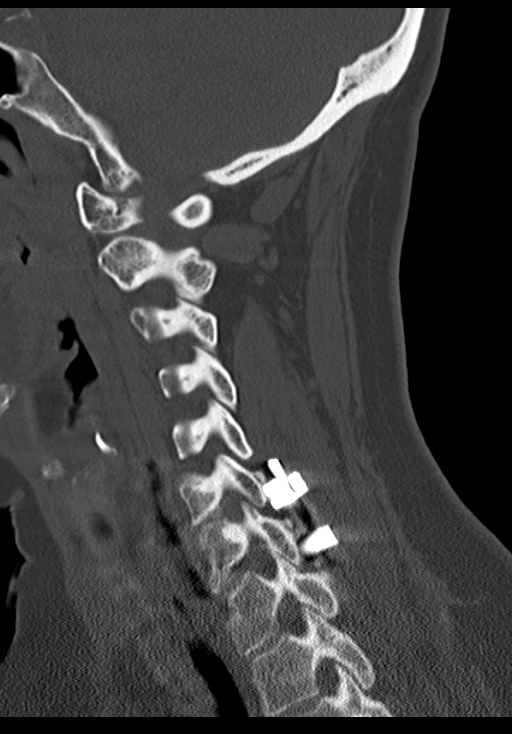
[im 40/60  bone]
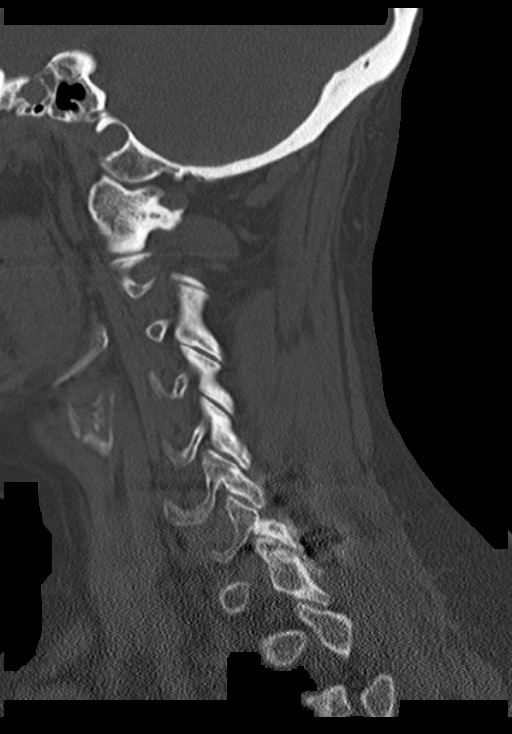

[13 of 33 positions shown; findings below may reference images not displayed]

FINDINGS: ACDF C5-6 and C6-7 with solid interbody fusion of both
levels.  Posterior hardware fusion at C6-7 in satisfactory position
and  alignment.  No fracture.  No acute bony abnormality.

C2-3 is intact.  C3-4 shows mild disc degeneration with early
spurring but no significant stenosis.  C4-5 reveals mild disc
degeneration and mild uncovertebral spurring without stenosis.
IMPRESSION: Solid interbody fusion C5-6 and C6-7.  Satisfactory posterior
hardware fusion C6-7.

Mild degenerative changes at C3-4 and C4-5 without disc protrusion
or significant stenosis.

## 2012-12-23 ENCOUNTER — Encounter: Payer: Self-pay | Admitting: Internal Medicine

## 2012-12-23 ENCOUNTER — Ambulatory Visit (INDEPENDENT_AMBULATORY_CARE_PROVIDER_SITE_OTHER): Payer: 59 | Admitting: Internal Medicine

## 2012-12-23 VITALS — BP 136/90 | HR 114 | Temp 97.6°F | Resp 16 | Wt 199.8 lb

## 2012-12-23 DIAGNOSIS — M25519 Pain in unspecified shoulder: Secondary | ICD-10-CM

## 2012-12-23 DIAGNOSIS — R259 Unspecified abnormal involuntary movements: Secondary | ICD-10-CM

## 2012-12-23 DIAGNOSIS — M549 Dorsalgia, unspecified: Secondary | ICD-10-CM

## 2012-12-23 DIAGNOSIS — R251 Tremor, unspecified: Secondary | ICD-10-CM | POA: Insufficient documentation

## 2012-12-23 NOTE — Assessment & Plan Note (Signed)
Likely related to her chronic neck pain. Administer toradol im injxn and increase mobic to 15mg  qd prn.

## 2012-12-23 NOTE — Assessment & Plan Note (Signed)
Neurology consult

## 2012-12-23 NOTE — Progress Notes (Signed)
  Subjective:    Patient ID: Kathy Huerta, female    DOB: [birth year], 52 y.o.   MRN: [id number]  HPI Pt presents to clinic for evaluation of back pain.  Notes persistent left posterior/back pain over the last several weeks. No injury/trauma. Location appears to be between medial scapula border and midline upper thoracic spine. Appears to be positional worsening with left arm movement or deep breath and reproducible to touch. No cough, fever or dyspnea. Chronic neck pain unfortunately is persistent and continues to be followed by the pain clinic on a regular basis. Also takes mobic 7.5mg  qd prn without adverse effect. Notes recent intermittent head tremor without other tremor location or neurologic sx's. Last occurred after car trip.  Past Medical History  Diagnosis Date  . DDD (degenerative disc disease)     on disability  . HTN (hypertension)   . Depression   . Heartburn   . Basal cell carcinoma 11/17/11    Dr. Naoma Diener   Past Surgical History  Procedure Date  . Anterior fusion cervical spine 2008    C6-7  Dr Wynetta Emery  . Posterior fusion cervical spine 2011    C6-7    reports that she has quit smoking. She has never used smokeless tobacco. She reports that she drinks about 3 ounces of alcohol per week. She reports that she does not use illicit drugs. family history includes Hyperlipidemia in her mother; Hypertension in her brother and mother; and Other in her father.  There is no history of Colon cancer and Stomach cancer. Allergies  Allergen Reactions  . Amoxicillin-Pot Clavulanate     REACTION: Throat swelling.  . Azithromycin     REACTION: Throat Swelling  . Cefazolin     REACTION: Throat Swelling  . Clarithromycin     REACTION: Throat Swelling  . Moxifloxacin     REACTION: Throat Swelling  . Penicillins     REACTION: throat swelling  . Sulfonamide Derivatives      Review of Systems see hpi     Objective:   Physical Exam  Nursing note and vitals  reviewed. Constitutional: She appears well-developed and well-nourished. No distress.  HENT:  Head: Normocephalic and atraumatic.  Musculoskeletal:       FROM bilateral arms. + tenderness along left rhomboid muscle distribution. No midline spinal tenderness or bony abn.  Neurological: She is alert.       No current head or hand tremor  Skin: Skin is warm and dry. She is not diaphoretic.  Psychiatric: She has a normal mood and affect.          Assessment & Plan:

## 2012-12-28 MED ORDER — KETOROLAC TROMETHAMINE 30 MG/ML IJ SOLN
30.0000 mg | Freq: Once | INTRAMUSCULAR | Status: AC
  Administered 2012-12-27: 30 mg via INTRAMUSCULAR

## 2012-12-28 NOTE — Addendum Note (Signed)
Addended by: Regis Bill on: 12/28/2012 03:29 PM   Modules accepted: Orders

## 2013-01-06 ENCOUNTER — Encounter: Payer: Self-pay | Admitting: Internal Medicine

## 2013-01-06 ENCOUNTER — Ambulatory Visit (INDEPENDENT_AMBULATORY_CARE_PROVIDER_SITE_OTHER): Payer: 59 | Admitting: Internal Medicine

## 2013-01-06 VITALS — BP 130/98 | HR 101 | Temp 98.2°F | Resp 16

## 2013-01-06 DIAGNOSIS — M542 Cervicalgia: Secondary | ICD-10-CM

## 2013-01-06 DIAGNOSIS — G8929 Other chronic pain: Secondary | ICD-10-CM

## 2013-01-06 NOTE — Progress Notes (Signed)
  Subjective:    Patient ID: Kathy Huerta, female    DOB: 05-22-1961, 52 y.o.   MRN: [id number]  HPI Pt presents to clinic for evaluation of neck and back pain. Unfortunately she continues to suffer from chronic posterior neck pain despite surgery. The pain is intensifying in severity and is now more lately also involving bilateral thoracic pain. Severity last night was 10/10 and almost prompted an ED visit. Today pain has improved only mildly and is rated 7/10. BP and HR elevated but in the setting of pain. Reviewed in detail all treatments attempted to date. Is currently followed by the pain clinic who recently changed her muscle relaxer to zanaflex and continues to prescribe percocet. Has attempted and failed PT, OTC TENS unit, therapeutic massage and could not tolerate neurontin due to eye related side effects.   Past Medical History  Diagnosis Date  . DDD (degenerative disc disease)     on disability  . HTN (hypertension)   . Depression   . Heartburn   . Basal cell carcinoma 11/17/11    Dr. Naoma Diener   Past Surgical History  Procedure Date  . Anterior fusion cervical spine 2008    C6-7  Dr Wynetta Emery  . Posterior fusion cervical spine 2011    C6-7    reports that she has quit smoking. She has never used smokeless tobacco. She reports that she drinks about 3 ounces of alcohol per week. She reports that she does not use illicit drugs. family history includes Hyperlipidemia in her mother; Hypertension in her brother and mother; and Other in her father.  There is no history of Colon cancer and Stomach cancer. Allergies  Allergen Reactions  . Amoxicillin-Pot Clavulanate     REACTION: Throat swelling.  . Azithromycin     REACTION: Throat Swelling  . Cefazolin     REACTION: Throat Swelling  . Clarithromycin     REACTION: Throat Swelling  . Moxifloxacin     REACTION: Throat Swelling  . Penicillins     REACTION: throat swelling  . Sulfonamide Derivatives      Review of Systems see  hpi     Objective:   Physical Exam  Constitutional: She appears well-developed. She appears distressed.  HENT:  Head: Normocephalic and atraumatic.  Eyes: Conjunctivae normal are normal. No scleral icterus.  Neurological: She is alert.  Psychiatric: She has a normal mood and affect. Her behavior is normal.          Assessment & Plan:

## 2013-01-06 NOTE — Assessment & Plan Note (Signed)
Worsening pain despite appropriate and extensive treatment. Has failed surgery and has failed multiple conservative modalities. Long discussion held regarding further treatment. Given her degree of pain we must approach a cumulative type treatment with combination of multiple modalities in hopes of gaining control of her chronic pain. She will continue to follow up closely with the pain clinic for titration. She will inquire if she is an appropriate candidate for consideration of epidural or facet injections.  -Given toradol im injection 30mg  today -Continue mobic -Prescription provided for TENS unit -Discussed possible compounded medications available through [facility] She will consider. Cost may be an issue.

## 2013-01-11 MED ORDER — KETOROLAC TROMETHAMINE 30 MG/ML IJ SOLN
30.0000 mg | Freq: Once | INTRAMUSCULAR | Status: AC
  Administered 2013-01-10: 30 mg via INTRAMUSCULAR

## 2013-01-11 NOTE — Addendum Note (Signed)
Addended by: Regis Bill on: 01/11/2013 03:07 PM   Modules accepted: Orders

## 2013-01-25 ENCOUNTER — Telehealth: Payer: Self-pay

## 2013-01-25 NOTE — Telephone Encounter (Signed)
Informed pt of [employer] results and MD stating that if pt wants to fight the findings she would need to come in for an office visit with Dr Abner Greenspan. appt scheduled and pt asked me not to send this paperwork in yet.

## 2013-01-28 ENCOUNTER — Ambulatory Visit (INDEPENDENT_AMBULATORY_CARE_PROVIDER_SITE_OTHER): Payer: 59 | Admitting: Family Medicine

## 2013-01-28 ENCOUNTER — Encounter: Payer: Self-pay | Admitting: Family Medicine

## 2013-01-28 ENCOUNTER — Ambulatory Visit: Payer: 59 | Admitting: Family Medicine

## 2013-01-28 VITALS — BP 150/105 | HR 96 | Temp 97.6°F | Ht 67.75 in | Wt 198.1 lb

## 2013-01-28 DIAGNOSIS — G8929 Other chronic pain: Secondary | ICD-10-CM

## 2013-01-28 DIAGNOSIS — I10 Essential (primary) hypertension: Secondary | ICD-10-CM

## 2013-01-28 DIAGNOSIS — M542 Cervicalgia: Secondary | ICD-10-CM

## 2013-01-28 DIAGNOSIS — R51 Headache: Secondary | ICD-10-CM

## 2013-01-28 MED ORDER — OLMESARTAN MEDOXOMIL 20 MG PO TABS: 20.0000 mg | ORAL_TABLET | Freq: Every day | ORAL | Status: DC

## 2013-01-28 NOTE — Patient Instructions (Addendum)

## 2013-01-29 ENCOUNTER — Encounter: Payer: Self-pay | Admitting: Family Medicine

## 2013-01-29 DIAGNOSIS — R51 Headache: Secondary | ICD-10-CM

## 2013-01-29 DIAGNOSIS — R519 Headache, unspecified: Secondary | ICD-10-CM | POA: Insufficient documentation

## 2013-01-29 HISTORY — DX: Headache: R51

## 2013-01-29 NOTE — Assessment & Plan Note (Signed)
Patient currently on disability and still struggles with daily and constant pain. Takes pain meds as needed but tries to take them is improved is possible to 2 this addition a cause. She has undergone 2 different surgeries in her neck one in November of 2008 the other one in January of 2011. The 2008 surgery was a bone graft which was not successful in the 2011 was actually a posterior instrumentation. Her pain is persistent and she follows with Dr. Danie Chandler for this. She is only able to do minimal activities at home and is unable to work at this time. Even simple activities in the home will put her in bed for hours secondary to the pain.

## 2013-01-29 NOTE — Progress Notes (Signed)
Patient ID: Kathy Huerta, female   DOB: [birth year], 52 y.o.   MRN: [id number] MORRIS MARKHAM [id number] 03-05-1961 01/29/2013      Progress Note-Follow Up  Subjective  Chief Complaint  Chief Complaint  Patient presents with  . Follow-up    on [employer] disability paperwork    HPI  Patient is a 52 year old Caucasian female who is in today to discuss her chronic neck and back pain. She began having neck pain back in 2008 N/A just progressively worsened. She's now had 2 surgeries and unfortunately is out on disability. She is unable to maintain a given position for any period of time. Sitting, walking, bending, standing, lying alternate cause discomfort. She previously was employed doing computer work but is unable to sit for any period of time to return to his work. She has intermittent episodes of tremors and headaches secondary to her pain. Also notes her blood pressures become more labile. No chest pain or palpitations. No shortness of breath GI or GU symptoms noted today  Past Medical History  Diagnosis Date  . DDD (degenerative disc disease)     on disability  . HTN (hypertension)   . Depression   . Heartburn   . Basal cell carcinoma 11/17/11    Dr. Naoma Diener  . Headache 01/29/2013    Past Surgical History  Procedure Laterality Date  . Anterior fusion cervical spine  2008    C6-7  Dr Wynetta Emery  . Posterior fusion cervical spine  2011    C6-7    Family History  Problem Relation Age of Onset  . Hypertension Mother   . Hyperlipidemia Mother   . Other Father     DDD, colectomy,cardiac stent, afib  . Hypertension Brother   . Colon cancer Neg Hx   . Stomach cancer Neg Hx     History   Social History  . Marital Status: Married    Spouse Name: N/A    Number of Children: N/A  . Years of Education: N/A   Occupational History  . Not on file.   Social History Main Topics  . Smoking status: Former Games developer  . Smokeless tobacco: Never Used  . Alcohol Use: 3.0 oz/week    5 Cans of  beer per week  . Drug Use: No  . Sexually Active: Not on file   Other Topics Concern  . Not on file   Social History Narrative   Reviewed history and no changes required.   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   Regular exercise-no   Smoking Status:  quit   Caffeine use/day:  none   Does Patient Exercise:  no          Current Outpatient Prescriptions on File Prior to Visit  Medication Sig Dispense Refill  . oxyCODONE-acetaminophen (PERCOCET) 5-325 MG per tablet Take 1 tablet by mouth every 6 (six) hours as needed.        . propranolol ER (INDERAL LA) 80 MG 24 hr capsule Take 80 mg by mouth as needed.       . ramelteon (ROZEREM) 8 MG tablet Take 8 mg by mouth at bedtime.        . sertraline (ZOLOFT) 100 MG tablet Take 1 tablet (100 mg total) by mouth every morning.  30 tablet  11  . tiZANidine (ZANAFLEX) 2 MG tablet Take 2 mg by mouth every 8 (eight) hours  as needed.      . triamterene-hydrochlorothiazide (MAXZIDE-25) 37.5-25 MG per tablet Take 0.5 each (0.5 tablets total) by mouth daily.  45 tablet  1   No current facility-administered medications on file prior to visit.    Allergies  Allergen Reactions  . Amoxicillin-Pot Clavulanate     REACTION: Throat swelling.  . Azithromycin     REACTION: Throat Swelling  . Cefazolin     REACTION: Throat Swelling  . Clarithromycin     REACTION: Throat Swelling  . Moxifloxacin     REACTION: Throat Swelling  . Penicillins     REACTION: throat swelling  . Sulfonamide Derivatives     Review of Systems  Review of Systems  Constitutional: Negative for fever and malaise/fatigue.  HENT: Positive for neck pain. Negative for congestion.   Eyes: Negative for discharge.  Respiratory: Negative for shortness of breath.   Cardiovascular: Negative for chest pain, palpitations and leg swelling.  Gastrointestinal: Negative for nausea,  abdominal pain and diarrhea.  Genitourinary: Negative for dysuria.  Musculoskeletal: Negative for falls.  Skin: Negative for rash.  Neurological: Positive for tremors. Negative for loss of consciousness and headaches.  Endo/Heme/Allergies: Negative for polydipsia.  Psychiatric/Behavioral: Negative for depression and suicidal ideas. The patient is nervous/anxious and has insomnia.     Objective  BP 150/105  Pulse 96  Temp(Src) 97.6 F (36.4 C) (Temporal)  Ht 5' 7.75" (1.721 m)  Wt 198 lb 1.9 oz (89.867 kg)  BMI 30.34 kg/m2  SpO2 96%  LMP 09/23/2011  Physical Exam  Physical Exam  Constitutional: She is oriented to person, place, and time and well-developed, well-nourished, and in no distress. No distress.  HENT:  Head: Normocephalic and atraumatic.  Eyes: Conjunctivae are normal.  Neck: Neck supple. No thyromegaly present.  Cardiovascular: Normal rate, regular rhythm and normal heart sounds.   No murmur heard. Pulmonary/Chest: Effort normal and breath sounds normal. She has no wheezes.  Abdominal: Soft. Bowel sounds are normal. She exhibits no distension and no mass.  Musculoskeletal: She exhibits edema and tenderness.  Anterior neck incision well healed. Posterior incision with significant swelling around incision r>l side, tender to palp.  Lymphadenopathy:    She has no cervical adenopathy.  Neurological: She is alert and oriented to person, place, and time.  Skin: Skin is warm and dry. No rash noted. She is not diaphoretic.  Psychiatric: Memory, affect and judgment normal.    Lab Results  Component Value Date   TSH 3.400 04/14/2012   Lab Results  Component Value Date   WBC 6.5 04/14/2012   HGB 13.7 04/14/2012   HCT 40.6 04/14/2012   MCV 89.2 04/14/2012   PLT 290 04/14/2012   Lab Results  Component Value Date   CREATININE 0.60 04/14/2012   BUN 12 04/14/2012   NA 141 04/14/2012   K 3.9 04/14/2012   CL 104 04/14/2012   CO2 27 04/14/2012   Lab Results  Component Value  Date   ALT 49* 04/24/2011   AST 29 04/24/2011   ALKPHOS 83 04/24/2011   BILITOT 0.4 04/24/2011   Lab Results  Component Value Date   CHOL 263* 04/14/2012   Lab Results  Component Value Date   HDL 64 04/14/2012   Lab Results  Component Value Date   LDLCALC 162* 04/14/2012   Lab Results  Component Value Date   TRIG 183* 04/14/2012   Lab Results  Component Value Date   CHOLHDL 4.1 04/14/2012     Assessment & Plan  Chronic neck pain Patient currently on disability and still struggles with daily and constant pain. Takes pain meds as needed but tries to take them is improved is possible to 2 this addition a cause. She has undergone 2 different surgeries in her neck one in November of 2008 the other one in January of 2011. The 2008 surgery was a bone graft which was not successful in the 2011 was actually a posterior instrumentation. Her pain is persistent and she follows with Dr. Danie Chandler for this. She is only able to do minimal activities at home and is unable to work at this time. Even simple activities in the home will put her in bed for hours secondary to the pain.  Hypertension Patient blood pressure up today secondary to pain.  Continue current meds.  Headache Struggles with frequent headaches secondary to chronic neck pain and spasm as well. Gets partial relief with her pain meds. Also notes occasional tremor. Says this occurs about once a month and last for about 3 days for neurologic workup has been unremarkable.   Have reviewed her chart and made note of the numerous modalities she has tried including PT, TENS, OTC meds, prescription meds and massage

## 2013-01-29 NOTE — Assessment & Plan Note (Signed)
Patient blood pressure up today secondary to pain.  Continue current meds.

## 2013-01-29 NOTE — Assessment & Plan Note (Signed)
Struggles with frequent headaches secondary to chronic neck pain and spasm as well. Gets partial relief with her pain meds. Also notes occasional tremor. Says this occurs about once a month and last for about 3 days for neurologic workup has been unremarkable.

## 2013-02-05 ENCOUNTER — Other Ambulatory Visit: Payer: Self-pay

## 2013-02-09 ENCOUNTER — Telehealth: Payer: Self-pay | Admitting: Internal Medicine

## 2013-02-09 NOTE — Telephone Encounter (Signed)
She checked on Monday and Tuesday and it has not been faxed back to [place].  Could you give her an update as to when it will be faxed

## 2013-02-09 NOTE — Telephone Encounter (Signed)
Dr Abner Greenspan are you done with her disability paperwork? She stated it was supposed to be turned in on the 15th but she explained to [facility] about the snow and they gave pt an extension to 03-14-13.  Pt stated that if it can't be done by 03-14-13 to let her know and she will try to get another extension. Please advise?

## 2013-02-09 NOTE — Telephone Encounter (Signed)
I do not have any paperwork anymore I feel 95 % positive I did it and am hoping it is in one of the piles at one of the offices because I am done with all of the paperwork I have.

## 2013-02-10 NOTE — Telephone Encounter (Signed)
See if you can find this form for [place] if not get another copy from insurance and I will complete by tomorrow am. My note from that day should be printed but has all the info in it I will need to complete form

## 2013-02-10 NOTE — Telephone Encounter (Signed)
Form not located. Left detailed message on pt's cell# apologizing for the misplaced form and requested to have another copy sent to [place] for completion.

## 2013-02-11 NOTE — Telephone Encounter (Signed)
Spoke with pt, she has a blank copy of the form and will bring it by for [place] to make another copy today after 1pm.

## 2013-02-11 NOTE — Telephone Encounter (Signed)
thanks

## 2013-02-11 NOTE — Telephone Encounter (Signed)
Pt brought her copy of the paperwork to the office. Very poor quality but made copy and placed on Provider's desk to address Monday.

## 2013-02-14 NOTE — Telephone Encounter (Signed)
Forms have been faxed to [facility]

## 2013-02-27 ENCOUNTER — Emergency Department (HOSPITAL_BASED_OUTPATIENT_CLINIC_OR_DEPARTMENT_OTHER): Payer: 59

## 2013-02-27 ENCOUNTER — Encounter (HOSPITAL_COMMUNITY): Admission: EM | Disposition: A | Discharge status: Home / Self Care | Payer: Self-pay | Source: Home / Self Care

## 2013-02-27 ENCOUNTER — Encounter (HOSPITAL_BASED_OUTPATIENT_CLINIC_OR_DEPARTMENT_OTHER): Payer: Self-pay

## 2013-02-27 ENCOUNTER — Inpatient Hospital Stay (HOSPITAL_COMMUNITY): Payer: 59

## 2013-02-27 ENCOUNTER — Encounter (HOSPITAL_COMMUNITY): Payer: Self-pay | Admitting: Anesthesiology

## 2013-02-27 ENCOUNTER — Inpatient Hospital Stay (HOSPITAL_COMMUNITY): Payer: 59 | Admitting: Anesthesiology

## 2013-02-27 ENCOUNTER — Observation Stay (HOSPITAL_BASED_OUTPATIENT_CLINIC_OR_DEPARTMENT_OTHER)
Admission: EM | Admit: 2013-02-27 | Discharge: 2013-02-28 | Disposition: A | Discharge status: Home / Self Care | Payer: 59 | Attending: General Surgery | Admitting: General Surgery

## 2013-02-27 DIAGNOSIS — K219 Gastro-esophageal reflux disease without esophagitis: Secondary | ICD-10-CM | POA: Insufficient documentation

## 2013-02-27 DIAGNOSIS — I10 Essential (primary) hypertension: Secondary | ICD-10-CM | POA: Diagnosis present

## 2013-02-27 DIAGNOSIS — K7689 Other specified diseases of liver: Secondary | ICD-10-CM | POA: Insufficient documentation

## 2013-02-27 DIAGNOSIS — F329 Major depressive disorder, single episode, unspecified: Secondary | ICD-10-CM | POA: Insufficient documentation

## 2013-02-27 DIAGNOSIS — K8010 Calculus of gallbladder with chronic cholecystitis without obstruction: Secondary | ICD-10-CM | POA: Insufficient documentation

## 2013-02-27 DIAGNOSIS — K812 Acute cholecystitis with chronic cholecystitis: Secondary | ICD-10-CM | POA: Diagnosis present

## 2013-02-27 DIAGNOSIS — Z79899 Other long term (current) drug therapy: Secondary | ICD-10-CM | POA: Insufficient documentation

## 2013-02-27 DIAGNOSIS — F3289 Other specified depressive episodes: Secondary | ICD-10-CM | POA: Insufficient documentation

## 2013-02-27 DIAGNOSIS — G8929 Other chronic pain: Secondary | ICD-10-CM | POA: Diagnosis present

## 2013-02-27 DIAGNOSIS — Z86010 Personal history of colonic polyps: Secondary | ICD-10-CM

## 2013-02-27 DIAGNOSIS — M5135 Other intervertebral disc degeneration, thoracolumbar region: Secondary | ICD-10-CM | POA: Diagnosis present

## 2013-02-27 DIAGNOSIS — K5909 Other constipation: Secondary | ICD-10-CM | POA: Diagnosis present

## 2013-02-27 DIAGNOSIS — K8066 Calculus of gallbladder and bile duct with acute and chronic cholecystitis without obstruction: Secondary | ICD-10-CM

## 2013-02-27 DIAGNOSIS — K8000 Calculus of gallbladder with acute cholecystitis without obstruction: Principal | ICD-10-CM | POA: Insufficient documentation

## 2013-02-27 DIAGNOSIS — R51 Headache: Secondary | ICD-10-CM | POA: Insufficient documentation

## 2013-02-27 DIAGNOSIS — K810 Acute cholecystitis: Secondary | ICD-10-CM

## 2013-02-27 HISTORY — DX: Acute pancreatitis without necrosis or infection, unspecified: K85.90

## 2013-02-27 HISTORY — DX: Other cervical disc degeneration, unspecified cervical region: M50.30

## 2013-02-27 HISTORY — PX: CHOLECYSTECTOMY: SHX55

## 2013-02-27 HISTORY — DX: Other intervertebral disc degeneration, thoracolumbar region: M51.35

## 2013-02-27 LAB — CBC WITH DIFFERENTIAL/PLATELET
Basophils Absolute: 0 10*3/uL (ref 0.0–0.1)
Basophils Relative: 0 % (ref 0–1)
Eosinophils Absolute: 0 10*3/uL (ref 0.0–0.7)
Eosinophils Relative: 0 % (ref 0–5)
MCH: 30.6 pg (ref 26.0–34.0)
MCHC: 35.3 g/dL (ref 30.0–36.0)
MCV: 86.7 fL (ref 78.0–100.0)
Neutrophils Relative %: 87 % — ABNORMAL HIGH (ref 43–77)
Platelets: 290 10*3/uL (ref 150–400)
RDW: 12.2 % (ref 11.5–15.5)

## 2013-02-27 LAB — HEPATIC FUNCTION PANEL
Albumin: 4 g/dL (ref 3.5–5.2)
Total Bilirubin: 0.3 mg/dL (ref 0.3–1.2)
Total Protein: 7.5 g/dL (ref 6.0–8.3)

## 2013-02-27 LAB — URINALYSIS, ROUTINE W REFLEX MICROSCOPIC
Ketones, ur: 15 mg/dL — AB
Leukocytes, UA: NEGATIVE
Nitrite: NEGATIVE
Protein, ur: NEGATIVE mg/dL
pH: 6.5 (ref 5.0–8.0)

## 2013-02-27 LAB — CBC
HCT: 35.4 % — ABNORMAL LOW (ref 36.0–46.0)
MCHC: 34.5 g/dL (ref 30.0–36.0)
Platelets: 277 10*3/uL (ref 150–400)
RDW: 12.7 % (ref 11.5–15.5)
WBC: 8.8 10*3/uL (ref 4.0–10.5)

## 2013-02-27 LAB — LIPASE, BLOOD: Lipase: 29 U/L (ref 11–59)

## 2013-02-27 LAB — BASIC METABOLIC PANEL
Calcium: 9.3 mg/dL (ref 8.4–10.5)
GFR calc Af Amer: >90 mL/min (ref >90)
GFR calc non Af Amer: >90 mL/min (ref >90)
Glucose, Bld: 128 mg/dL — ABNORMAL HIGH (ref 70–99)
Potassium: 3.5 mEq/L (ref 3.5–5.1)
Sodium: 137 mEq/L (ref 135–145)

## 2013-02-27 LAB — TROPONIN I: Troponin I: <0.3 ng/mL (ref <0.30)

## 2013-02-27 LAB — LACTATE DEHYDROGENASE: LDH: 144 U/L (ref 94–250)

## 2013-02-27 LAB — CREATININE, SERUM
Creatinine, Ser: 0.63 mg/dL (ref 0.50–1.10)
GFR calc Af Amer: >90 mL/min (ref >90)
GFR calc non Af Amer: >90 mL/min (ref >90)

## 2013-02-27 SURGERY — LAPAROSCOPIC CHOLECYSTECTOMY WITH INTRAOPERATIVE CHOLANGIOGRAM
Anesthesia: General | Wound class: Contaminated

## 2013-02-27 MED ORDER — OXYCODONE HCL 5 MG PO TABS
5.0000 mg | ORAL_TABLET | ORAL | Status: DC | PRN
  Administered 2013-02-28: 10 mg via ORAL
  Administered 2013-02-28: 5 mg via ORAL
  Administered 2013-02-28: 10 mg via ORAL
  Filled 2013-02-27: qty 2
  Filled 2013-02-27: qty 1
  Filled 2013-02-27 (×2): qty 2

## 2013-02-27 MED ORDER — CLINDAMYCIN PHOSPHATE 900 MG/50ML IV SOLN
INTRAVENOUS | Status: AC
  Filled 2013-02-27: qty 50

## 2013-02-27 MED ORDER — SERTRALINE HCL 100 MG PO TABS
100.0000 mg | ORAL_TABLET | Freq: Every day | ORAL | Status: DC
  Administered 2013-02-28: 100 mg via ORAL
  Filled 2013-02-27: qty 1

## 2013-02-27 MED ORDER — MENTHOL 3 MG MT LOZG
1.0000 | LOZENGE | OROMUCOSAL | Status: DC | PRN
  Filled 2013-02-27: qty 9

## 2013-02-27 MED ORDER — HEPARIN SODIUM (PORCINE) 5000 UNIT/ML IJ SOLN
5000.0000 [IU] | Freq: Three times a day (TID) | INTRAMUSCULAR | Status: DC
  Administered 2013-02-28: 5000 [IU] via SUBCUTANEOUS
  Filled 2013-02-27 (×4): qty 1

## 2013-02-27 MED ORDER — HYDROMORPHONE HCL PF 1 MG/ML IJ SOLN
1.0000 mg | Freq: Once | INTRAMUSCULAR | Status: AC
  Administered 2013-02-27: 1 mg via INTRAVENOUS
  Filled 2013-02-27: qty 1

## 2013-02-27 MED ORDER — GENTAMICIN SULFATE 40 MG/ML IJ SOLN
520.0000 mg | INTRAVENOUS | Status: DC
  Administered 2013-02-27: 520 mg via INTRAVENOUS
  Filled 2013-02-27 (×2): qty 13

## 2013-02-27 MED ORDER — MELOXICAM 7.5 MG PO TABS
7.5000 mg | ORAL_TABLET | Freq: Every day | ORAL | Status: DC
  Filled 2013-02-27 (×2): qty 1

## 2013-02-27 MED ORDER — BUPIVACAINE-EPINEPHRINE 0.25% -1:200000 IJ SOLN
INTRAMUSCULAR | Status: DC | PRN
  Administered 2013-02-27: 50 mL

## 2013-02-27 MED ORDER — GENTAMICIN SULFATE 40 MG/ML IJ SOLN: 2.0000 mg/kg | Freq: Two times a day (BID) | INTRAVENOUS | Status: DC

## 2013-02-27 MED ORDER — FENTANYL CITRATE 0.05 MG/ML IJ SOLN
INTRAMUSCULAR | Status: DC | PRN
  Administered 2013-02-27: 50 ug via INTRAVENOUS
  Administered 2013-02-27 (×2): 100 ug via INTRAVENOUS

## 2013-02-27 MED ORDER — LIDOCAINE HCL (CARDIAC) 20 MG/ML IV SOLN
INTRAVENOUS | Status: DC | PRN
  Administered 2013-02-27: 50 mg via INTRAVENOUS

## 2013-02-27 MED ORDER — LACTATED RINGERS IR SOLN
Status: DC | PRN
  Administered 2013-02-27: 1000 mL

## 2013-02-27 MED ORDER — DIPHENHYDRAMINE HCL 12.5 MG/5ML PO ELIX: 12.5000 mg | ORAL_SOLUTION | Freq: Four times a day (QID) | ORAL | Status: DC | PRN

## 2013-02-27 MED ORDER — HEPARIN SODIUM (PORCINE) 5000 UNIT/ML IJ SOLN
5000.0000 [IU] | Freq: Three times a day (TID) | INTRAMUSCULAR | Status: DC
  Filled 2013-02-27 (×3): qty 1

## 2013-02-27 MED ORDER — IOHEXOL 300 MG/ML  SOLN
100.0000 mL | Freq: Once | INTRAMUSCULAR | Status: AC | PRN
  Administered 2013-02-27: 100 mL via INTRAVENOUS

## 2013-02-27 MED ORDER — HYDROMORPHONE HCL PF 1 MG/ML IJ SOLN
INTRAMUSCULAR | Status: DC | PRN
  Administered 2013-02-27 (×2): 1 mg via INTRAVENOUS

## 2013-02-27 MED ORDER — RAMELTEON 8 MG PO TABS
8.0000 mg | ORAL_TABLET | Freq: Every day | ORAL | Status: DC
  Filled 2013-02-27 (×2): qty 1

## 2013-02-27 MED ORDER — MAGNESIUM HYDROXIDE 400 MG/5ML PO SUSP: 30.0000 mL | Freq: Two times a day (BID) | ORAL | Status: DC | PRN

## 2013-02-27 MED ORDER — IOHEXOL 300 MG/ML  SOLN
50.0000 mL | Freq: Once | INTRAMUSCULAR | Status: AC | PRN
  Administered 2013-02-27: 50 mL via ORAL

## 2013-02-27 MED ORDER — SODIUM CHLORIDE 0.9 % IV SOLN
Freq: Once | INTRAVENOUS | Status: AC
  Administered 2013-02-27: 07:00:00 via INTRAVENOUS

## 2013-02-27 MED ORDER — IOHEXOL 300 MG/ML  SOLN
INTRAMUSCULAR | Status: AC
  Filled 2013-02-27: qty 1

## 2013-02-27 MED ORDER — LACTATED RINGERS IV SOLN
INTRAVENOUS | Status: DC | PRN
  Administered 2013-02-27: 20:00:00 via INTRAVENOUS

## 2013-02-27 MED ORDER — ALUM & MAG HYDROXIDE-SIMETH 200-200-20 MG/5ML PO SUSP: 30.0000 mL | Freq: Four times a day (QID) | ORAL | Status: DC | PRN

## 2013-02-27 MED ORDER — OXYCODONE-ACETAMINOPHEN 5-325 MG PO TABS: 1.0000 | ORAL_TABLET | Freq: Four times a day (QID) | ORAL | Status: DC | PRN

## 2013-02-27 MED ORDER — SUCCINYLCHOLINE CHLORIDE 20 MG/ML IJ SOLN
INTRAMUSCULAR | Status: DC | PRN
  Administered 2013-02-27: 100 mg via INTRAVENOUS

## 2013-02-27 MED ORDER — PROMETHAZINE HCL 25 MG/ML IJ SOLN
6.2500 mg | INTRAMUSCULAR | Status: DC | PRN
Start: 2013-02-27 — End: 2013-02-28
  Administered 2013-02-27: 6.25 mg via INTRAVENOUS

## 2013-02-27 MED ORDER — FENTANYL CITRATE 0.05 MG/ML IJ SOLN
25.0000 ug | INTRAMUSCULAR | Status: DC | PRN
  Administered 2013-02-27 (×2): 50 ug via INTRAVENOUS

## 2013-02-27 MED ORDER — MAGIC MOUTHWASH
15.0000 mL | Freq: Four times a day (QID) | ORAL | Status: DC | PRN
  Filled 2013-02-27: qty 15

## 2013-02-27 MED ORDER — LACTATED RINGERS IV SOLN: INTRAVENOUS | Status: DC

## 2013-02-27 MED ORDER — METOPROLOL TARTRATE 12.5 MG HALF TABLET
12.5000 mg | ORAL_TABLET | Freq: Two times a day (BID) | ORAL | Status: DC | PRN
  Filled 2013-02-27: qty 1

## 2013-02-27 MED ORDER — SODIUM CHLORIDE 0.9 % IV BOLUS (SEPSIS)
1000.0000 mL | Freq: Once | INTRAVENOUS | Status: AC
  Administered 2013-02-27: 1000 mL via INTRAVENOUS

## 2013-02-27 MED ORDER — IOHEXOL 300 MG/ML  SOLN
INTRAMUSCULAR | Status: DC | PRN
  Administered 2013-02-27: 50 mL via INTRATHECAL

## 2013-02-27 MED ORDER — ROCURONIUM BROMIDE 100 MG/10ML IV SOLN
INTRAVENOUS | Status: DC | PRN
  Administered 2013-02-27: 30 mg via INTRAVENOUS

## 2013-02-27 MED ORDER — NEOSTIGMINE METHYLSULFATE 1 MG/ML IJ SOLN
INTRAMUSCULAR | Status: DC | PRN
  Administered 2013-02-27: 4 mg via INTRAVENOUS

## 2013-02-27 MED ORDER — KETOROLAC TROMETHAMINE 30 MG/ML IJ SOLN
INTRAMUSCULAR | Status: DC | PRN
  Administered 2013-02-27: 30 mg via INTRAVENOUS

## 2013-02-27 MED ORDER — LIP MEDEX EX OINT
1.0000 "application " | TOPICAL_OINTMENT | Freq: Two times a day (BID) | CUTANEOUS | Status: DC
  Administered 2013-02-28: 1 via TOPICAL
  Filled 2013-02-27: qty 7

## 2013-02-27 MED ORDER — PROPOFOL 10 MG/ML IV BOLUS
INTRAVENOUS | Status: DC | PRN
  Administered 2013-02-27: 130 mg via INTRAVENOUS

## 2013-02-27 MED ORDER — HYDROMORPHONE HCL PF 1 MG/ML IJ SOLN
0.5000 mg | INTRAMUSCULAR | Status: DC | PRN
  Administered 2013-02-27 (×2): 1 mg via INTRAVENOUS
  Filled 2013-02-27 (×3): qty 1

## 2013-02-27 MED ORDER — DIPHENHYDRAMINE HCL 50 MG/ML IJ SOLN: 12.5000 mg | Freq: Four times a day (QID) | INTRAMUSCULAR | Status: DC | PRN

## 2013-02-27 MED ORDER — ACETAMINOPHEN 500 MG PO TABS
1000.0000 mg | ORAL_TABLET | Freq: Three times a day (TID) | ORAL | Status: DC
  Administered 2013-02-27 – 2013-02-28 (×2): 1000 mg via ORAL
  Filled 2013-02-27 (×4): qty 2

## 2013-02-27 MED ORDER — CLINDAMYCIN PHOSPHATE 600 MG/50ML IV SOLN
600.0000 mg | Freq: Four times a day (QID) | INTRAVENOUS | Status: DC
  Filled 2013-02-27 (×6): qty 50

## 2013-02-27 MED ORDER — PROMETHAZINE HCL 25 MG/ML IJ SOLN
INTRAMUSCULAR | Status: AC
  Filled 2013-02-27: qty 1

## 2013-02-27 MED ORDER — KETOROLAC TROMETHAMINE 30 MG/ML IJ SOLN: 15.0000 mg | Freq: Once | INTRAMUSCULAR | Status: AC | PRN

## 2013-02-27 MED ORDER — ACETAMINOPHEN 650 MG RE SUPP: 650.0000 mg | Freq: Four times a day (QID) | RECTAL | Status: DC | PRN

## 2013-02-27 MED ORDER — KCL IN DEXTROSE-NACL 40-5-0.45 MEQ/L-%-% IV SOLN
INTRAVENOUS | Status: DC
  Administered 2013-02-27 (×2): via INTRAVENOUS
  Filled 2013-02-27 (×4): qty 1000

## 2013-02-27 MED ORDER — BISACODYL 10 MG RE SUPP: 10.0000 mg | Freq: Two times a day (BID) | RECTAL | Status: DC | PRN

## 2013-02-27 MED ORDER — MEPERIDINE HCL 50 MG/ML IJ SOLN: 6.2500 mg | INTRAMUSCULAR | Status: DC | PRN

## 2013-02-27 MED ORDER — TRIAMTERENE-HCTZ 37.5-25 MG PO TABS
0.5000 | ORAL_TABLET | Freq: Every day | ORAL | Status: DC
  Filled 2013-02-27: qty 0.5

## 2013-02-27 MED ORDER — HEPARIN SODIUM (PORCINE) 5000 UNIT/ML IJ SOLN: 5000.0000 [IU] | Freq: Three times a day (TID) | INTRAMUSCULAR | Status: DC

## 2013-02-27 MED ORDER — ACETAMINOPHEN 325 MG PO TABS: 650.0000 mg | ORAL_TABLET | Freq: Four times a day (QID) | ORAL | Status: DC | PRN

## 2013-02-27 MED ORDER — SACCHAROMYCES BOULARDII 250 MG PO CAPS
250.0000 mg | ORAL_CAPSULE | Freq: Two times a day (BID) | ORAL | Status: DC
  Administered 2013-02-28: 250 mg via ORAL
  Filled 2013-02-27 (×4): qty 1

## 2013-02-27 MED ORDER — FENTANYL CITRATE 0.05 MG/ML IJ SOLN
INTRAMUSCULAR | Status: AC
  Filled 2013-02-27: qty 2

## 2013-02-27 MED ORDER — CLINDAMYCIN PHOSPHATE 600 MG/50ML IV SOLN
600.0000 mg | Freq: Four times a day (QID) | INTRAVENOUS | Status: DC
  Administered 2013-02-28 (×2): 600 mg via INTRAVENOUS
  Filled 2013-02-27 (×5): qty 50

## 2013-02-27 MED ORDER — PROMETHAZINE HCL 25 MG/ML IJ SOLN
12.5000 mg | Freq: Four times a day (QID) | INTRAMUSCULAR | Status: DC | PRN
Start: 2013-02-27 — End: 2013-02-28
  Filled 2013-02-27: qty 1

## 2013-02-27 MED ORDER — MIDAZOLAM HCL 2 MG/2ML IJ SOLN: 0.5000 mg | Freq: Once | INTRAMUSCULAR | Status: AC | PRN

## 2013-02-27 MED ORDER — IRBESARTAN 150 MG PO TABS
150.0000 mg | ORAL_TABLET | Freq: Every day | ORAL | Status: DC
  Filled 2013-02-27 (×2): qty 1

## 2013-02-27 MED ORDER — CLINDAMYCIN PHOSPHATE 600 MG/50ML IV SOLN
600.0000 mg | Freq: Four times a day (QID) | INTRAVENOUS | Status: DC
  Administered 2013-02-27: 600 mg via INTRAVENOUS
  Administered 2013-02-27: 900 mg via INTRAVENOUS
  Filled 2013-02-27 (×4): qty 50

## 2013-02-27 MED ORDER — MIDAZOLAM HCL 5 MG/5ML IJ SOLN
INTRAMUSCULAR | Status: DC | PRN
  Administered 2013-02-27: 2 mg via INTRAVENOUS

## 2013-02-27 MED ORDER — BUPIVACAINE-EPINEPHRINE 0.25% -1:200000 IJ SOLN
INTRAMUSCULAR | Status: AC
  Filled 2013-02-27: qty 1

## 2013-02-27 MED ORDER — ONDANSETRON HCL 4 MG/2ML IJ SOLN
4.0000 mg | Freq: Once | INTRAMUSCULAR | Status: AC
  Administered 2013-02-27: 4 mg via INTRAVENOUS
  Filled 2013-02-27: qty 2

## 2013-02-27 MED ORDER — GLYCOPYRROLATE 0.2 MG/ML IJ SOLN
INTRAMUSCULAR | Status: DC | PRN
  Administered 2013-02-27: .6 mg via INTRAVENOUS

## 2013-02-27 MED ORDER — TIZANIDINE HCL 2 MG PO TABS
2.0000 mg | ORAL_TABLET | Freq: Three times a day (TID) | ORAL | Status: DC | PRN
  Filled 2013-02-27: qty 1

## 2013-02-27 MED ORDER — ONDANSETRON HCL 4 MG/2ML IJ SOLN
4.0000 mg | Freq: Four times a day (QID) | INTRAMUSCULAR | Status: DC | PRN
  Administered 2013-02-27: 4 mg via INTRAVENOUS
  Filled 2013-02-27: qty 2

## 2013-02-27 MED ORDER — PSYLLIUM 95 % PO PACK
1.0000 | PACK | Freq: Two times a day (BID) | ORAL | Status: DC
  Administered 2013-02-28: 1 via ORAL
  Filled 2013-02-27 (×4): qty 1

## 2013-02-27 MED ORDER — PHENYLEPHRINE HCL 10 MG/ML IJ SOLN
INTRAMUSCULAR | Status: DC | PRN
  Administered 2013-02-27: 40 ug via INTRAVENOUS

## 2013-02-27 MED ORDER — ACETAMINOPHEN 500 MG PO TABS
1000.0000 mg | ORAL_TABLET | Freq: Three times a day (TID) | ORAL | Status: DC
  Filled 2013-02-27 (×6): qty 2

## 2013-02-27 SURGICAL SUPPLY — 44 items
APPLIER CLIP 5 13 M/L LIGAMAX5 (MISCELLANEOUS) ×2
APPLIER CLIP ROT 10 11.4 M/L (STAPLE) ×2
APR CLP MED LRG 11.4X10 (STAPLE) ×1
APR CLP MED LRG 5 ANG JAW (MISCELLANEOUS) ×1
BAG SPEC RTRVL LRG 6X4 10 (ENDOMECHANICALS) ×1
CABLE HIGH FREQUENCY MONO STRZ (ELECTRODE) IMPLANT
CANISTER SUCTION 2500CC (MISCELLANEOUS) ×2 IMPLANT
CLIP APPLIE 5 13 M/L LIGAMAX5 (MISCELLANEOUS) IMPLANT
CLIP APPLIE ROT 10 11.4 M/L (STAPLE) IMPLANT
CLOTH BEACON ORANGE TIMEOUT ST (SAFETY) ×2 IMPLANT
COVER MAYO STAND STRL (DRAPES) ×2 IMPLANT
DECANTER SPIKE VIAL GLASS SM (MISCELLANEOUS) ×2 IMPLANT
DRAPE C-ARM 42X72 X-RAY (DRAPES) ×2 IMPLANT
DRAPE LAPAROSCOPIC ABDOMINAL (DRAPES) ×2 IMPLANT
DRAPE WARM FLUID 44X44 (DRAPE) ×1 IMPLANT
DRSG TEGADERM 2-3/8X2-3/4 SM (GAUZE/BANDAGES/DRESSINGS) ×7 IMPLANT
DRSG TEGADERM 4X4.75 (GAUZE/BANDAGES/DRESSINGS) ×2 IMPLANT
ELECT REM PT RETURN 9FT ADLT (ELECTROSURGICAL) ×2
ELECTRODE REM PT RTRN 9FT ADLT (ELECTROSURGICAL) ×1 IMPLANT
ENDOLOOP SUT PDS II  0 18 (SUTURE) ×1
ENDOLOOP SUT PDS II 0 18 (SUTURE) IMPLANT
GLOVE ECLIPSE 8.0 STRL XLNG CF (GLOVE) ×2 IMPLANT
GLOVE INDICATOR 8.0 STRL GRN (GLOVE) ×2 IMPLANT
GOWN STRL NON-REIN LRG LVL3 (GOWN DISPOSABLE) ×2 IMPLANT
GOWN STRL REIN XL XLG (GOWN DISPOSABLE) ×4 IMPLANT
KIT BASIN OR (CUSTOM PROCEDURE TRAY) ×2 IMPLANT
NS IRRIG 1000ML POUR BTL (IV SOLUTION) ×2 IMPLANT
POUCH SPECIMEN RETRIEVAL 10MM (ENDOMECHANICALS) ×1 IMPLANT
SCALPEL HARMONIC ACE (MISCELLANEOUS) IMPLANT
SCISSORS LAP 5X35 DISP (ENDOMECHANICALS) IMPLANT
SET CHOLANGIOGRAPH MIX (MISCELLANEOUS) IMPLANT
SET IRRIG TUBING LAPAROSCOPIC (IRRIGATION / IRRIGATOR) ×1 IMPLANT
SLEEVE Z-THREAD 5X100MM (TROCAR) IMPLANT
SUT MNCRL AB 4-0 PS2 18 (SUTURE) ×2 IMPLANT
SUT VICRYL 0 ENDOLOOP (SUTURE) IMPLANT
SUT VICRYL 0 TIES 12 18 (SUTURE) IMPLANT
SYR 20CC LL (SYRINGE) ×2 IMPLANT
TOWEL OR 17X26 10 PK STRL BLUE (TOWEL DISPOSABLE) ×2 IMPLANT
TRAY LAP CHOLE (CUSTOM PROCEDURE TRAY) ×2 IMPLANT
TROCAR BLADELESS OPT 5 75 (ENDOMECHANICALS) ×2 IMPLANT
TROCAR CANNULA W/PORT DUAL 5MM (MISCELLANEOUS) ×1 IMPLANT
TROCAR Z-THREAD FIOS 11X100 BL (TROCAR) IMPLANT
TROCAR Z-THREAD FIOS 5X100MM (TROCAR) IMPLANT
TUBING INSUFFLATION 10FT LAP (TUBING) ×2 IMPLANT

## 2013-02-27 NOTE — ED Provider Notes (Signed)
History     CSN: [id number]  Arrival date & time 02/27/13  0402   First MD Initiated Contact with Patient 02/27/13 0430      Chief Complaint  Patient presents with  . Abdominal Pain    (Consider location/radiation/quality/duration/timing/severity/associated sxs/prior treatment) HPI Comments: Pt with hx of pancreatitis comes in with cc of abd pain. Pt has hx of pancreatitis, pain started with sudden onset, around 10:30 pm, and is sharp, constant, epigastric, non radiating, and is worse with any movement. Pt has some nausea, no diarrhea, last BM was normal. No hx of alcohol abuse, no chest pain.   Patient is a 52 y.o. female presenting with abdominal pain. The history is provided by the patient.  Abdominal Pain Associated symptoms: nausea   Associated symptoms: no chest pain, no dysuria, no shortness of breath and no vomiting     Past Medical History  Diagnosis Date  . DDD (degenerative disc disease)     on disability  . HTN (hypertension)   . Depression   . Heartburn   . Basal cell carcinoma 11/17/11    Dr. Naoma Diener  . Headache 01/29/2013    Past Surgical History  Procedure Laterality Date  . Anterior fusion cervical spine  2008    C6-7  Dr Wynetta Emery  . Posterior fusion cervical spine  2011    C6-7    Family History  Problem Relation Age of Onset  . Hypertension Mother   . Hyperlipidemia Mother   . Other Father     DDD, colectomy,cardiac stent, afib  . Hypertension Brother   . Colon cancer Neg Hx   . Stomach cancer Neg Hx     History  Substance Use Topics  . Smoking status: Former Games developer  . Smokeless tobacco: Never Used  . Alcohol Use: 3.0 oz/week    5 Cans of beer per week    OB History   Grav Para Term Preterm Abortions TAB SAB Ect Mult Living                  Review of Systems  Constitutional: Negative for activity change.  HENT: Negative for facial swelling and neck pain.   Respiratory: Negative for shortness of breath and wheezing.    Cardiovascular: Negative for chest pain.  Gastrointestinal: Positive for nausea and abdominal pain. Negative for vomiting, blood in stool and abdominal distention.  Genitourinary: Negative for dysuria and difficulty urinating.  Skin: Negative for color change.  Neurological: Negative for speech difficulty and headaches.  Hematological: Does not bruise/bleed easily.  Psychiatric/Behavioral: Negative for confusion.    Allergies  Amoxicillin-pot clavulanate; Azithromycin; Cefazolin; Clarithromycin; Moxifloxacin; Penicillins; and Sulfonamide derivatives  Home Medications   Current Outpatient Rx  Name  Route  Sig  Dispense  Refill  . meloxicam (MOBIC) 7.5 MG tablet   Oral   Take 7.5 mg by mouth daily.         Marland Kitchen olmesartan (BENICAR) 20 MG tablet   Oral   Take 1 tablet (20 mg total) by mouth daily.   90 tablet   1   . oxyCODONE-acetaminophen (PERCOCET) 5-325 MG per tablet   Oral   Take 1 tablet by mouth every 6 (six) hours as needed.           . propranolol ER (INDERAL LA) 80 MG 24 hr capsule   Oral   Take 80 mg by mouth as needed.          . ramelteon (ROZEREM) 8 MG tablet  Oral   Take 8 mg by mouth at bedtime.           . sertraline (ZOLOFT) 100 MG tablet   Oral   Take 1 tablet (100 mg total) by mouth every morning.   30 tablet   11   . tiZANidine (ZANAFLEX) 2 MG tablet   Oral   Take 2 mg by mouth every 8 (eight) hours as needed.         . triamterene-hydrochlorothiazide (MAXZIDE-25) 37.5-25 MG per tablet   Oral   Take 0.5 each (0.5 tablets total) by mouth daily.   45 tablet   1     BP 135/84  Pulse 105  Temp(Src) 98.1 F (36.7 C) (Oral)  Resp 20  SpO2 100%  LMP 09/23/2011  Physical Exam  Nursing note and vitals reviewed. Constitutional: She is oriented to person, place, and time. She appears well-developed and well-nourished.  HENT:  Head: Normocephalic and atraumatic.  Eyes: EOM are normal. Pupils are equal, round, and reactive to light.   Neck: Neck supple.  Cardiovascular: Normal rate, regular rhythm and normal heart sounds.   No murmur heard. Pulmonary/Chest: Effort normal. No respiratory distress.  Abdominal: Soft. She exhibits no distension. There is tenderness. There is guarding. There is no rebound.  Neurological: She is alert and oriented to person, place, and time.  Skin: Skin is warm and dry.    ED Course  Procedures (including critical care time)  Labs Reviewed  CBC WITH DIFFERENTIAL - Abnormal; Notable for the following:    WBC 12.5 (*)    Neutrophils Relative 87 (*)    Neutro Abs 10.8 (*)    Lymphocytes Relative 9 (*)    All other components within normal limits  BASIC METABOLIC PANEL - Abnormal; Notable for the following:    Glucose, Bld 128 (*)    All other components within normal limits  HEPATIC FUNCTION PANEL - Abnormal; Notable for the following:    Indirect Bilirubin 0.2 (*)    All other components within normal limits  LACTIC ACID, PLASMA - Abnormal; Notable for the following:    Lactic Acid, Venous 2.5 (*)    All other components within normal limits  LIPASE, BLOOD  TROPONIN I  URINALYSIS, ROUTINE W REFLEX MICROSCOPIC  LACTATE DEHYDROGENASE   No results found.   No diagnosis found.    MDM   Date: 02/27/2013  Rate: 100  Rhythm: normal sinus rhythm  QRS Axis: normal  Intervals: normal  ST/T Wave abnormalities: normal  Conduction Disutrbances: none  Narrative Interpretation: unremarkable  DDx includes: Pancreatitis Hepatobiliary pathology including cholecystitis Gastritis/PUD SBO ACS syndrome Aortic Dissection   Pt comes in w/ epigastric abd pain, sudden onset. Hx of pancreatitis. Pt has no risk factors for pancreatitis, and has GERD, but no known gastritis, PUD. No risk factors for dissection, and bilateral pulses appear normal.  We will get basic labs, AAS to r/o free air and reassess.  6:47 AM Pain improved now. Pt has leukocytosis, and she is tachycardic. This  could be the latent phase for condition like perforated viscus, so i think a CT is still warranted to r/o perforation.   Derwood Kaplan, MD 02/27/13 [phone]

## 2013-02-27 NOTE — ED Notes (Signed)
Patient reports that she developed upper epigastric pain 4 hours pta. Reports ate greasy meal and has vomited multiple times pta.

## 2013-02-27 NOTE — ED Notes (Signed)
Pt gone to xray

## 2013-02-27 NOTE — Anesthesia Preprocedure Evaluation (Signed)
Anesthesia Evaluation  Patient identified by MRN, date of birth, ID band Patient awake    Reviewed: Allergy & Precautions, H&P , Patient's Chart, lab work & pertinent test results, reviewed documented beta blocker date and time   History of Anesthesia Complications Negative for: history of anesthetic complications  Airway Mallampati: III TM Distance: >3 FB Neck ROM: limited    Dental no notable dental hx.    Pulmonary neg pulmonary ROS,  breath sounds clear to auscultation  Pulmonary exam normal       Cardiovascular Exercise Tolerance: Good hypertension, negative cardio ROS  Rhythm:regular Rate:Normal     Neuro/Psych  Headaches, PSYCHIATRIC DISORDERS Depression negative neurological ROS  negative psych ROS   GI/Hepatic negative GI ROS, Neg liver ROS, GERD-  Controlled,  Endo/Other  negative endocrine ROS  Renal/GU negative Renal ROS     Musculoskeletal   Abdominal   Peds  Hematology negative hematology ROS (+)   Anesthesia Other Findings History Date Comments History Date Comments    HTN (hypertension)     Depression        Heartburn     Basal cell carcinoma 11/17/11 Dr. Naoma Diener    Headache 01/29/2013   DDD (degenerative disc disease), thoracolumbar   on disability    DDD (degenerative disc disease), cervical   s/p fusion Pancreatitis 04/24/2011    Reproductive/Obstetrics negative OB ROS                           Anesthesia Physical Anesthesia Plan  ASA: II  Anesthesia Plan: General ETT   Post-op Pain Management:    Induction: Intravenous  Airway Management Planned: Video Laryngoscope Planned  Additional Equipment:   Intra-op Plan:   Post-operative Plan:   Informed Consent: I have reviewed the patients History and Physical, chart, labs and discussed the procedure including the risks, benefits and alternatives for the proposed anesthesia with the patient or authorized  representative who has indicated his/her understanding and acceptance.   Dental Advisory Given  Plan Discussed with: CRNA and Surgeon  Anesthesia Plan Comments:         Anesthesia Quick Evaluation

## 2013-02-27 NOTE — Anesthesia Postprocedure Evaluation (Signed)
  Anesthesia Post Note  Patient: Kathy Huerta  Procedure(s) Performed: Procedure(s) (LRB): LAPAROSCOPIC CHOLECYSTECTOMY WITH INTRAOPERATIVE CHOLANGIOGRAM (N/A)  Anesthesia type: GA  Patient location: PACU  Post pain: Pain level controlled  Post assessment: Post-op Vital signs reviewed  Last Vitals:  Filed Vitals:   02/27/13 2245  BP: 115/62  Pulse: 94  Temp:   Resp: 26    Post vital signs: Reviewed  Level of consciousness: sedated  Complications: No apparent anesthesia complications

## 2013-02-27 NOTE — ED Provider Notes (Signed)
Care assumed from Dr. Rhunette Croft at shift change awaiting results of ct scan.  The ct shows what appears to be acute cholecystitis and she remains ttp in the epigastrium without rebound or guarding.  Murphy's sign is equivocal.  I have spoken with Dr. Michaell Cowing at [facility] who agrees to accept the patient in transfer.  Geoffery Lyons, MD 02/27/13 0900

## 2013-02-27 NOTE — Progress Notes (Addendum)
ANTIBIOTIC CONSULT NOTE - INITIAL  Pharmacy Consult for:  Gentamicin Indication:  Suspected cholecystitis  Allergies  Allergen Reactions  . Amoxicillin-Pot Clavulanate     REACTION: Throat swelling.  . Azithromycin     REACTION: Throat Swelling  . Cefazolin     REACTION: Throat Swelling  . Clarithromycin     REACTION: Throat Swelling  . Moxifloxacin     REACTION: Throat Swelling  . Penicillins     REACTION: throat swelling  . Sulfonamide Derivatives     Patient Measurements: Height: 5' 7.72" (172 cm) (taken from 01/28/13) Weight: 198 lb 3.1 oz (89.9 kg) (taken 01/28/13) IBW/kg (Calculated) : 63.25 BMI: 30.4 kg/m*2   Adjusted weight:  74 kg  Vital Signs: Temp: 98.9 F (37.2 C) (03/09 1342) Temp src: Oral (03/09 1342) BP: 116/53 mmHg (03/09 1342) Pulse Rate: 83 (03/09 1342)    Labs:  Recent Labs  02/27/13 0527  WBC 12.5*  HGB 13.3  PLT 290  CREATININE 0.60   Estimated Creatinine Clearance: 96 ml/min (by C-G formula based on Cr of 0.6).  Medical History: Past Medical History  Diagnosis Date  . HTN (hypertension)   . Depression   . Heartburn   . Basal cell carcinoma 11/17/11    Dr. Naoma Diener  . Headache 01/29/2013  . DDD (degenerative disc disease), thoracolumbar     on disability  . DDD (degenerative disc disease), cervical     s/p fusion    Medications:  Scheduled:  . [COMPLETED] sodium chloride   Intravenous Once  . clindamycin (CLEOCIN) IV  600 mg Intravenous Q6H  . gentamicin  520 mg Intravenous Q24H  . [COMPLETED]  HYDROmorphone (DILAUDID) injection  1 mg Intravenous Once  . [COMPLETED]  HYDROmorphone (DILAUDID) injection  1 mg Intravenous Once  . irbesartan  150 mg Oral Daily  . lip balm  1 application Topical BID  . meloxicam  7.5 mg Oral Daily  . [COMPLETED] ondansetron  4 mg Intravenous Once  . psyllium  1 packet Oral BID  . ramelteon  8 mg Oral QHS  . saccharomyces boulardii  250 mg Oral BID  . [START ON 02/28/2013] sertraline  100 mg Oral  Daily  . [COMPLETED] sodium chloride  1,000 mL Intravenous Once  . [DISCONTINUED] gentamicin  2 mg/kg Intravenous Q12H  . [DISCONTINUED] heparin  5,000 Units Subcutaneous Q8H  . [DISCONTINUED] heparin  5,000 Units Subcutaneous Q8H    Assessment:  Asked to review the order for IV gentamicin and adjust the dosing strength, schedule, rate of infusion, etc. as needed to optimize therapy.  The gentamicin order was originally entered as 2mg /kg every 12 hours.  We will change to once-daily or "extended-interval" dosing, a method which results in efficacy similar to traditional dosing methods, minimizes aminoglycoside-associated renal toxicity, and helps to reduce the emergence of resistant organisms.  Goal of Therapy:  Eradication of infection  Plan:   Gentamicin 7 mg/kg or 520 mg (using adjusted weight) every 24 hours.  Random gentamicin level drawn 10 hours after the end of the first infusion.  Polo Riley R.Ph. 02/27/2013 3:01 PM

## 2013-02-27 NOTE — ED Notes (Signed)
[id number]<AV> Expected date:02/27/13<BR> Expected time:10:30 AM<BR> Means of arrival:<BR> Comments:<BR> Tx  From [facility] for Dr Michaell Cowing

## 2013-02-27 NOTE — Transfer of Care (Signed)
Immediate Anesthesia Transfer of Care Note  Patient: Kathy Huerta  Procedure(s) Performed: Procedure(s) (LRB): LAPAROSCOPIC CHOLECYSTECTOMY WITH INTRAOPERATIVE CHOLANGIOGRAM (N/A)  Patient Location: PACU  Anesthesia Type: General  Level of Consciousness: sedated, patient cooperative and responds to stimulaton  Airway & Oxygen Therapy: Patient Spontanous Breathing and Patient connected to face mask oxgen  Post-op Assessment: Report given to PACU RN and Post -op Vital signs reviewed and stable  Post vital signs: Reviewed and stable  Complications: No apparent anesthesia complications

## 2013-02-27 NOTE — H&P (Addendum)
Kathy Huerta  1961-11-11 [id number]   This patient is a 52 y.o.female who presents today for surgical evaluation at the request of Dr. Judd Lien, [facility] ED.   Reason for evaluation: Upper abdominal pain.  Probable cholecystitis.  Pleasant obese female.  Had severe upper abdominal pain after eating a meal five days ago.  Went away after two hours.  Felt bloated and nauseated.  Had another attack last night.  Even more intense.  Vomited.  Vomiting and control.  Pain orbital.  Micah Flesher to [facility].  CT scan concerning for perhaps early cholecystitis.  Surgical consultation requested. She does have heartburn.  Usually Zantac controls that.  It did not help.  This is much different.  Can walk 2030 minutes OK but has a lot of cervical and thoracic neck pain/disc disease.  Prior surgeries.  On partial disability for this.On chronic narcotics.  Has chronic constipation with a bowel movement twice a week.  Not on any bowel regimen.  Patient has allergies to numerous antibiotics but not foods.  Father had to have partial colectomy for unresectable colon polyp, ;otherwise, no other personal nor family history of GI/colon cancer, inflammatory bowel disease, irritable bowel syndrome, allergy such as Celiac Sprue, dietary/dairy problems, colitis, ulcers nor gastritis.  No recent sick contacts/gastroenteritis.  No travel outside the country.  No changes in diet.  Had colonoscopy at age 13 that was normal.  She thinks it was Dr. Sharla Kidney with the [facility] gastroenterology.    Past Medical History  Diagnosis Date  . HTN (hypertension)   . Depression   . Heartburn   . Basal cell carcinoma 11/17/11    Dr. Naoma Diener  . Headache 01/29/2013  . DDD (degenerative disc disease), thoracolumbar     on disability  . DDD (degenerative disc disease), cervical     s/p fusion    Past Surgical History  Procedure Laterality Date  . Anterior fusion cervical spine  2008    C6-7  Dr Wynetta Emery  . Posterior fusion cervical spine   2011    C6-7    History   Social History  . Marital Status: Married    Spouse Name: N/A    Number of Children: N/A  . Years of Education: N/A   Occupational History  . Not on file.   Social History Main Topics  . Smoking status: Former Games developer  . Smokeless tobacco: Never Used  . Alcohol Use: 3.0 oz/week    5 Cans of beer per week  . Drug Use: No  . Sexually Active: Not on file   Other Topics Concern  . Not on file   Social History Narrative   Reviewed history and no changes required.   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   Regular exercise-no   Smoking Status:  quit   Caffeine use/day:  none   Does Patient Exercise:  no          Family History  Problem Relation Age of Onset  . Hypertension Mother   . Hyperlipidemia Mother   . Other Father     DDD, colectomy,cardiac stent, afib  . Hypertension Brother   . Colon cancer Neg Hx   . Stomach cancer Neg Hx     Current Facility-Administered Medications  Medication Dose Route Frequency Provider Last Rate Last Dose  . acetaminophen (TYLENOL) tablet 650 mg  650 mg Oral Q6H PRN  Ardeth Sportsman, MD       Or  . acetaminophen (TYLENOL) suppository 650 mg  650 mg Rectal Q6H PRN Ardeth Sportsman, MD      . alum & mag hydroxide-simeth (MAALOX/MYLANTA) 200-200-20 MG/5ML suspension 30 mL  30 mL Oral Q6H PRN Ardeth Sportsman, MD      . bisacodyl (DULCOLAX) suppository 10 mg  10 mg Rectal Q12H PRN Ardeth Sportsman, MD      . clindamycin (CLEOCIN) IVPB 600 mg  600 mg Intravenous Q6H Ardeth Sportsman, MD      . dextrose 5 % and 0.45 % NaCl with KCl 40 mEq/L infusion   Intravenous Continuous Ardeth Sportsman, MD      . diphenhydrAMINE (BENADRYL) injection 12.5-25 mg  12.5-25 mg Intravenous Q6H PRN Ardeth Sportsman, MD       Or  . diphenhydrAMINE (BENADRYL) 12.5 MG/5ML elixir 12.5-25 mg  12.5-25 mg Oral Q6H PRN Ardeth Sportsman, MD       . gentamicin (GARAMYCIN) 2 mg/kg in dextrose 5 % 50 mL IVPB  2 mg/kg Intravenous Q12H Ardeth Sportsman, MD      . heparin injection 5,000 Units  5,000 Units Subcutaneous Q8H Ardeth Sportsman, MD      . HYDROmorphone (DILAUDID) injection 0.5-2 mg  0.5-2 mg Intravenous Q2H PRN Ardeth Sportsman, MD      . irbesartan (AVAPRO) tablet 150 mg  150 mg Oral Daily Ardeth Sportsman, MD      . lip balm (CARMEX) ointment 1 application  1 application Topical BID Ardeth Sportsman, MD      . magic mouthwash  15 mL Oral QID PRN Ardeth Sportsman, MD      . magnesium hydroxide (MILK OF MAGNESIA) suspension 30 mL  30 mL Oral Q12H PRN Ardeth Sportsman, MD      . meloxicam (MOBIC) tablet 7.5 mg  7.5 mg Oral Daily Ardeth Sportsman, MD      . metoprolol tartrate (LOPRESSOR) tablet 12.5 mg  12.5 mg Oral Q12H PRN Ardeth Sportsman, MD      . ondansetron [facility]) injection 4 mg  4 mg Intravenous Q6H PRN Ardeth Sportsman, MD      . promethazine (PHENERGAN) injection 12.5-25 mg  12.5-25 mg Intravenous Q6H PRN Ardeth Sportsman, MD      . psyllium (HYDROCIL/METAMUCIL) packet 1 packet  1 packet Oral BID Ardeth Sportsman, MD      . ramelteon (ROZEREM) tablet 8 mg  8 mg Oral QHS Ardeth Sportsman, MD      . saccharomyces boulardii (FLORASTOR) capsule 250 mg  250 mg Oral BID Ardeth Sportsman, MD      . Melene Muller ON 02/28/2013] sertraline (ZOLOFT) tablet 100 mg  100 mg Oral Georgeanne Nim, MD      . tiZANidine (ZANAFLEX) tablet 2 mg  2 mg Oral Q8H PRN Ardeth Sportsman, MD       Current Outpatient Prescriptions  Medication Sig Dispense Refill  . meloxicam (MOBIC) 7.5 MG tablet Take 7.5 mg by mouth daily.      Marland Kitchen olmesartan (BENICAR) 20 MG tablet Take 1 tablet (20 mg total) by mouth daily.  90 tablet  1  . oxyCODONE-acetaminophen (PERCOCET) 5-325 MG per tablet Take 1 tablet by mouth every 6 (six) hours as needed.        . propranolol ER (INDERAL LA) 80 MG 24 hr capsule Take 80 mg by mouth  as needed.       . ramelteon (ROZEREM) 8 MG tablet  Take 8 mg by mouth at bedtime.        . sertraline (ZOLOFT) 100 MG tablet Take 1 tablet (100 mg total) by mouth every morning.  30 tablet  11  . tiZANidine (ZANAFLEX) 2 MG tablet Take 2 mg by mouth every 8 (eight) hours as needed.      . triamterene-hydrochlorothiazide (MAXZIDE-25) 37.5-25 MG per tablet Take 0.5 each (0.5 tablets total) by mouth daily.  45 tablet  1     Allergies  Allergen Reactions  . Amoxicillin-Pot Clavulanate     REACTION: Throat swelling.  . Azithromycin     REACTION: Throat Swelling  . Cefazolin     REACTION: Throat Swelling  . Clarithromycin     REACTION: Throat Swelling  . Moxifloxacin     REACTION: Throat Swelling  . Penicillins     REACTION: throat swelling  . Sulfonamide Derivatives     ROS: Constitutional:  No fevers, chills, sweats.  Weight stable Eyes:  No vision changes, No discharge HENT:  No sore throats, nasal drainage Lymph: No neck swelling, No bruising easily Pulmonary:  No cough, productive sputum CV: No orthopnea, PND  Patient walks 20 minutes for about 1/2 miles without difficulty.  No exertional chest/neck/shoulder/arm pain. GI:  No personal nor family history of inflammatory bowel disease, irritable bowel syndrome, allergy such as Celiac Sprue, dietary/dairy problems, colitis, ulcers nor gastritis.  No recent sick contacts/gastroenteritis.  No travel outside the country.  No changes in diet. Renal: No UTIs, No hematuria Genital:  No drainage, bleeding, masses Musculoskeletal: No severe joint pain.  Good ROM major joints Skin:  No sores or lesions.  No rashes Heme/Lymph:  No easy bleeding.  No swollen lymph nodes Neuro: No focal weakness/numbness.  No seizures Psych: No suicidal ideation.  No hallucinations  BP 110/74  Pulse 91  Temp(Src) 98.1 F (36.7 C) (Oral)  Resp 14  SpO2 93%  LMP 09/23/2011  Physical Exam: General: Pt awake/alert/oriented x4 in no major acute distress Eyes: PERRL, normal EOM. Sclera nonicteric Neuro: CN  II-XII intact w/o focal sensory/motor deficits. Lymph: No head/neck/groin lymphadenopathy Psych:  No delerium/psychosis/paranoia HENT: Normocephalic, Mucus membranes moist.  No thrush Neck: Supple, No tracheal deviation Chest: No pain.  Good respiratory excursion. CV:  Pulses intact.  Regular rhythm Abdomen: Soft, Nondistended.  Most tender epigastric.  Mod tender RUQ.  No true Murphy's sign but uncomfortable & just had narcotics.  No incarcerated hernias. Ext:  SCDs BLE.  No significant edema.  No cyanosis Skin: No petechiae / purpurea.  No major sores Musculoskeletal: No severe joint pain.  Good ROM major joints   Results:   Labs: Results for orders placed during the hospital encounter of 02/27/13 (from the past 48 hour(s))  CBC WITH DIFFERENTIAL     Status: Abnormal   Collection Time    02/27/13  5:27 AM      Result Value Range   WBC 12.5 (*) 4.0 - 10.5 K/uL   RBC 4.35  3.87 - 5.11 MIL/uL   Hemoglobin 13.3  12.0 - 15.0 g/dL   HCT 40.9  81.1 - 91.4 %   MCV 86.7  78.0 - 100.0 fL   MCH 30.6  26.0 - 34.0 pg   MCHC 35.3  30.0 - 36.0 g/dL   RDW 78.2  95.6 - 21.3 %   Platelets 290  150 - 400 K/uL   Neutrophils Relative 87 (*)  43 - 77 %   Neutro Abs 10.8 (*) 1.7 - 7.7 K/uL   Lymphocytes Relative 9 (*) 12 - 46 %   Lymphs Abs 1.1  0.7 - 4.0 K/uL   Monocytes Relative 5  3 - 12 %   Monocytes Absolute 0.6  0.1 - 1.0 K/uL   Eosinophils Relative 0  0 - 5 %   Eosinophils Absolute 0.0  0.0 - 0.7 K/uL   Basophils Relative 0  0 - 1 %   Basophils Absolute 0.0  0.0 - 0.1 K/uL  BASIC METABOLIC PANEL     Status: Abnormal   Collection Time    02/27/13  5:27 AM      Result Value Range   Sodium 137  135 - 145 mEq/L   Potassium 3.5  3.5 - 5.1 mEq/L   Chloride 99  96 - 112 mEq/L   CO2 23  19 - 32 mEq/L   Glucose, Bld 128 (*) 70 - 99 mg/dL   BUN 11  6 - 23 mg/dL   Creatinine, Ser 4.09  0.50 - 1.10 mg/dL   Calcium 9.3  8.4 - 81.1 mg/dL   GFR calc non Af Amer >90  >90 mL/min   GFR calc Af  Amer >90  >90 mL/min   Comment:            The eGFR has been calculated     using the CKD EPI equation.     This calculation has not been     validated in all clinical     situations.     eGFR's persistently     <90 mL/min signify     possible Chronic Kidney Disease.  HEPATIC FUNCTION PANEL     Status: Abnormal   Collection Time    02/27/13  5:27 AM      Result Value Range   Total Protein 7.5  6.0 - 8.3 g/dL   Albumin 4.0  3.5 - 5.2 g/dL   AST 18  0 - 37 U/L   ALT 19  0 - 35 U/L   Alkaline Phosphatase 89  39 - 117 U/L   Total Bilirubin 0.3  0.3 - 1.2 mg/dL   Bilirubin, Direct 0.1  0.0 - 0.3 mg/dL   Indirect Bilirubin 0.2 (*) 0.3 - 0.9 mg/dL  LIPASE, BLOOD     Status: None   Collection Time    02/27/13  5:27 AM      Result Value Range   Lipase 29  11 - 59 U/L  TROPONIN I     Status: None   Collection Time    02/27/13  5:27 AM      Result Value Range   Troponin I <0.30  <0.30 ng/mL   Comment:            Due to the release kinetics of cTnI,     a negative result within the first hours     of the onset of symptoms does not rule out     myocardial infarction with certainty.     If myocardial infarction is still suspected,     repeat the test at appropriate intervals.  LACTIC ACID, PLASMA     Status: Abnormal   Collection Time    02/27/13  5:27 AM      Result Value Range   Lactic Acid, Venous 2.5 (*) 0.5 - 2.2 mmol/L  LACTATE DEHYDROGENASE     Status: None   Collection Time    02/27/13  5:27 AM      Result Value Range   LDH 144  94 - 250 U/L  URINALYSIS, ROUTINE W REFLEX MICROSCOPIC     Status: Abnormal   Collection Time    02/27/13  6:49 AM      Result Value Range   Color, Urine YELLOW  YELLOW   APPearance CLOUDY (*) CLEAR   Specific Gravity, Urine 1.022  1.005 - 1.030   pH 6.5  5.0 - 8.0   Glucose, UA NEGATIVE  NEGATIVE mg/dL   Hgb urine dipstick NEGATIVE  NEGATIVE   Bilirubin Urine NEGATIVE  NEGATIVE   Ketones, ur 15 (*) NEGATIVE mg/dL   Protein, ur NEGATIVE   NEGATIVE mg/dL   Urobilinogen, UA 0.2  0.0 - 1.0 mg/dL   Nitrite NEGATIVE  NEGATIVE   Leukocytes, UA NEGATIVE  NEGATIVE   Comment: MICROSCOPIC NOT DONE ON URINES WITH NEGATIVE PROTEIN, BLOOD, LEUKOCYTES, NITRITE, OR GLUCOSE <1000 mg/dL.    Imaging / Studies: Ct Abdomen Pelvis W Contrast  02/27/2013  *RADIOLOGY REPORT*  Clinical Data: Abdominal pain for several days.  Rule out perforation.  CT ABDOMEN AND PELVIS WITH CONTRAST  Technique:  Multidetector CT imaging of the abdomen and pelvis was performed following the standard protocol during bolus administration of intravenous contrast.  Contrast: 50mL OMNIPAQUE IOHEXOL 300 MG/ML  SOLN, OMNIPAQUE IOHEXOL 300 MG/ML  SOLN  Comparison: 04/17/2011 and plain films earlier today.  Findings: Lung bases:  Mild left basilar scarring. Normal heart size without pericardial or pleural effusion.  Small hiatal hernia.  Abdomen/pelvis:  Normal liver, spleen, distal stomach, pancreas. The gallbladder is mildly distended.  Subtle pericholecystic edema cannot be excluded.  No calcified stones or biliary ductal dilatation.  Normal adrenal glands and kidneys. No retroperitoneal or retrocrural adenopathy.  Colonic stool burden suggests constipation.  Normal terminal ileum.  Appendix is not visualized but there is no evidence of right lower quadrant inflammation.  Normal small bowel without abdominal ascites.  No pelvic adenopathy.  Normal urinary bladder, without adnexal mass or significant free pelvic fluid.  Incidental note is made of a retroverted uterus.  Bones/Musculoskeletal:  No acute osseous abnormality.  Transitional S1 vertebral body.  IMPRESSION:  1.  Mild gallbladder distention with suspicion of pericholecystic edema.  Correlate with localized right upper quadrant symptoms and consider ultrasound to exclude acute cholecystitis. 2. Possible constipation. 3.  No evidence of bowel perforation.   Original Report Authenticated By: Jeronimo Greaves, M.D.    Dg Abd Acute  W/chest  02/27/2013  *RADIOLOGY REPORT*  Clinical Data: Epigastric abdominal pain, nausea and vomiting.  ACUTE ABDOMEN SERIES (ABDOMEN 2 VIEW & CHEST 1 VIEW)  Comparison: Chest radiograph, and CT of the abdomen and pelvis performed 04/17/2011  Findings: The lungs are well-aerated.  Minimal right basilar opacity may reflect atelectasis or possibly mild pneumonia; this appears to be new from the prior chest radiograph.  There is no evidence of pleural effusion or pneumothorax.  The cardiomediastinal silhouette is within normal limits.  The visualized bowel gas pattern is unremarkable.  Scattered stool and air are seen within the colon; there is no evidence of small bowel dilatation to suggest obstruction.  No free intra-abdominal air is identified on the provided upright view.  No acute osseous abnormalities are seen; the sacroiliac joints are unremarkable in appearance.  Cervical spinal fusion hardware is noted.  IMPRESSION:  1.  Unremarkable bowel gas pattern; no free intra-abdominal air seen. 2.  Minimal right basilar opacity may reflect atelectasis or possibly  mild pneumonia; alternatively, it could simply reflect overlying soft tissues.   Original Report Authenticated By: Tonia Ghent, M.D.     Medications / Allergies: per chart  Antibiotics: Anti-infectives   Start     Dose/Rate Route Frequency Ordered Stop   02/27/13 1215  gentamicin (GARAMYCIN) 2 mg/kg in dextrose 5 % 50 mL IVPB     2 mg/kg 100 mL/hr over 30 Minutes Intravenous Every 12 hours 02/27/13 1214     02/27/13 1215  clindamycin (CLEOCIN) IVPB 600 mg     600 mg 100 mL/hr over 30 Minutes Intravenous 4 times per day 02/27/13 1214        Assessment  [name]  52 y.o. female       Problem List:  Active Problems:   Acute cholecystitis with chronic cholecystitis   History,  physical exam and CT scans very suspicious for acute cholecystitis.  Differential diagnosis otherwise less likely.  Plan:  -admit -IV ABx - will use  clindamycin/gentamicin given many allergies -U/S to confirm Dx & prove +GS.  If ultrasound negative for stones then I decided to confirm diagnosis.  If both are negative, may offer gastroenterology reevaluation.  I suspect that is not too likely. -Chronic constipation robably due to narcotics chronic use.  Needs bowel regimen.  We will start psyllium. -nausea control -IVF -VTE prophylaxis- SCDs, etc -mobilize as tolerated to help recovery  If the diagnosis is confirmed as we suspect, plan laparoscopic cholecystectomy with cholangiogram later today vs. In the morning:  The anatomy & physiology of hepatobiliary & pancreatic function was discussed.  The pathophysiology of gallbladder dysfunction was discussed.  Natural history risks without surgery was discussed.   I feel the risks of no intervention will lead to serious problems that outweigh the operative risks; therefore, I recommended cholecystectomy to remove the pathology.  I explained laparoscopic techniques with possible need for an open approach.  Probable cholangiogram to evaluate the bilary tract was explained as well.    Risks such as bleeding, infection, abscess, leak, injury to other organs, need for further treatment, heart attack, death, and other risks were discussed.  I noted a good likelihood this will help address the problem.  Possibility that this will not correct all abdominal symptoms was explained.  Goals of post-operative recovery were discussed as well.  We will work to minimize complications.  An educational handout further explaining the pathology and treatment options was given as well.  Questions were answered.  The patient & husband expresses understanding & wishes to proceed with surgery.      Ardeth Sportsman, M.D., F.A.C.S. Gastrointestinal and Minimally Invasive Surgery [facility] [address] [place], [place] [address] [phone] Main / Paging [phone] Voice  Mail   02/27/2013  CARE TEAM:  PCP: Letitia Libra, Ala Dach, MD  Outpatient Care Team: Patient Care Team: Edwyna Perfect, MD as PCP - General (Internal Medicine) Farris Has as Consulting Physician (Dermatology)  Inpatient Treatment Team: Treatment Team: Attending Provider: Geoffery Lyons, MD; Consulting Physician: Bishop Limbo, MD; Technician: Billie Ruddy, NT; Registered Nurse: Maralyn Sago, RN

## 2013-02-27 NOTE — ED Notes (Signed)
Pt returned from xray. MD at bedside.

## 2013-02-27 NOTE — Op Note (Signed)
02/27/2013  9:51 PM  PATIENT:  Kathy Huerta  52 y.o. female  Patient Care Team: Edwyna Perfect, MD as PCP - General (Internal Medicine) Farris Has as Consulting Physician (Dermatology)  PRE-OPERATIVE DIAGNOSIS:  cholelithiasis  POST-OPERATIVE DIAGNOSIS:   Acute Cholecystitis with gallstones Steatohepatitis  PROCEDURE:  Procedure(s): LAPAROSCOPIC CHOLECYSTECTOMY WITH INTRAOPERATIVE CHOLANGIOGRAM  SURGEON:  Surgeon(s): Ardeth Sportsman, MD  ASSISTANT: RN   ANESTHESIA:   local and general  EBL:     Delay start of Pharmacological VTE agent (>24hrs) due to surgical blood loss or risk of bleeding:  no  DRAINS: none   SPECIMEN:  Source of Specimen:  Gallbladder  DISPOSITION OF SPECIMEN:  PATHOLOGY  COUNTS:  YES  PLAN OF CARE: Admit for overnight observation  PATIENT DISPOSITION:  PACU - hemodynamically stable.  INDICATION: Obese female with classic story biliary colic and persistent pain.  CT scan and ultrasound and physical exam concerning for cholecystitis.  I recommended surgery:  The anatomy & physiology of hepatobiliary & pancreatic function was discussed.  The pathophysiology of gallbladder dysfunction was discussed.  Natural history risks without surgery was discussed.   I feel the risks of no intervention will lead to serious problems that outweigh the operative risks; therefore, I recommended cholecystectomy to remove the pathology.  I explained laparoscopic techniques with possible need for an open approach.  Probable cholangiogram to evaluate the bilary tract was explained as well.    Risks such as bleeding, infection, abscess, leak, injury to other organs, need for further treatment, heart attack, death, and other risks were discussed.  I noted a good likelihood this will help address the problem.  Possibility that this will not correct all abdominal symptoms was explained.  Goals of post-operative recovery were discussed as well.  We will work to minimize  complications.  An educational handout further explaining the pathology and treatment options was given as well.  Questions were answered.  The patient & her husband expressed understanding & wishes to proceed with surgery.  OR FINDINGS: Gallbladder wall thickening with adhesions consistent with acute cholecystitis.  Cholangiogram showed no evidence of any biliary stones/obstruction.  Within normal limits.  DESCRIPTION:   The patient was identified & brought into the operating room. The patient was positioned supine with arms tucked. SCDs were active during the entire case. The patient underwent general anesthesia without any difficulty.  The abdomen was prepped and draped in a sterile fashion. A Surgical Timeout confirmed our plan.  We positioned the patient in reverse [place] & Huerta side up.  I placed a 5mm laparoscopic port through the abdominal wall using optical entry technique in the Huerta upper quadrant.  Entry was clean.  We induced carbon dioxide insufflation. Camera inspection revealed no injury. There were a few adhesions to the anterior abdominal wall in the Huerta lower quadrant.  I proceeded to continue with laparoscopic technique. I placed a #5 port in supraumbilical region, another 5mm port in the Huerta flank near the anterior axillary line, and a 10mm port in the left subxiphoid region obliquely within the falciform ligament.  I turned attention to the Huerta upper quadrant.   The gallbladder fundus was elevated cephalad. I used hook cautery to free the peritoneal coverings between the gallbladder and the liver on the posteriolateral and anteriomedial walls.   I used careful blunt and hook dissection to help get a good critical view of the cystic artery and cystic duct. I did further dissection to free a few centimeters of the  gallbladder off the liver bed to get a good critical view of the infundibulum and cystic duct. I mobilized the cystic artery; and, after getting a good 360  view, ligated the cystic artery using clips. I skeletonized the cystic duct.  I placed a clip on the infundibulum. I did a partial cystic duct-otomy and ensured patency. I milked out three stones out of the cystic duct.  I placed a 5 [place] cholangiocatheter through a puncture site at the Huerta subcostal ridge of the abdominal wall and directed it into the cystic duct.  We ran a cholangiogram with dilute radio-opaque contrast and continuous fluoroscopy. Contrast flowed from a side branch consistent with cystic duct cannulization. Contrast flowed up the common hepatic duct into the Huerta and left intrahepatic chains out to secondary radicals. Contrast flowed down the common bile duct easily across the normal ampulla into the duodenum.  This was consistent with a normal cholangiogram.  I removed the cholangiocatheter. I placed clips on the cystic duct x4.  I completed cystic duct transection. I ligated the cystic duct stump with a 0 PDS Endoloop.  I freed the gallbladder from its remaining attachments to the liver. I ensured hemostasis on the gallbladder fossa of the liver and elsewhere. I inspected the rest of the abdomen & detected no injury nor bleeding elsewhere.  I removed the gallbladder. I closed the subxiphoid fascia transversely using 0 Vicryl interrupted stitches.  I closed the skin using 4-0 monocryl stitch.  Sterile dressings were applied. The patient was extubated & arrived in the PACU in stable condition..  I had discussed postoperative care with the patient in the holding area. I discussed the patient's status to the family.  Questions were answered.  They expressed understanding & appreciation.  Instructions are written in the chart as well.

## 2013-02-28 ENCOUNTER — Encounter (HOSPITAL_COMMUNITY): Payer: Self-pay | Admitting: Surgery

## 2013-02-28 LAB — BASIC METABOLIC PANEL
CO2: 25 mEq/L (ref 19–32)
Calcium: 7.4 mg/dL — ABNORMAL LOW (ref 8.4–10.5)
Chloride: 104 mEq/L (ref 96–112)
Creatinine, Ser: 0.72 mg/dL (ref 0.50–1.10)
Glucose, Bld: 106 mg/dL — ABNORMAL HIGH (ref 70–99)

## 2013-02-28 LAB — CBC
HCT: 30.7 % — ABNORMAL LOW (ref 36.0–46.0)
MCH: 30.2 pg (ref 26.0–34.0)
MCV: 89.2 fL (ref 78.0–100.0)
Platelets: 219 10*3/uL (ref 150–400)
RDW: 12.8 % (ref 11.5–15.5)
WBC: 8.8 10*3/uL (ref 4.0–10.5)

## 2013-02-28 MED ORDER — OXYCODONE HCL 5 MG PO TABS
5.0000 mg | ORAL_TABLET | ORAL | Status: DC | PRN
Start: 2013-02-28 — End: 2013-07-21

## 2013-02-28 NOTE — Discharge Summary (Signed)
  Physician Discharge Summary  Patient ID: Kathy Huerta MRN: [id number] DOB/AGE: Nov 07, 1961 52 y.o.  Admit date: 02/27/2013 Discharge date: 02/28/2013  Admitting Diagnosis: Acute Cholecystitis with gallstones    Discharge Diagnosis Acute Cholecystitis with gallstones  Steatohepatitis  Consultants None  Procedures Laparoscopic Cholecystectomy with [facility]  Hospital Course 52 yr old female who presented to [facility] with abdominal pain.  Workup showed acute cholecystitis with gallstones.  Patient was admitted and underwent procedure listed above.  Tolerated procedure well and was transferred to the floor.  Diet was advanced as tolerated.  On POD#1, the patient was voiding well, tolerating diet, ambulating well, pain well controlled, vital signs stable, incisions c/d/i and felt stable for discharge home.  Patient will follow up in our office in 2-3 weeks and knows to call with questions or concerns.  Physical exam at discharge: General: awake, alert, nad Abd: soft, tender over incisions, incisions c/d/i, +BS     Medication List    TAKE these medications       meloxicam 7.5 MG tablet  Commonly known as:  MOBIC  Take 7.5 mg by mouth daily.     olmesartan 20 MG tablet  Commonly known as:  BENICAR  Take 1 tablet (20 mg total) by mouth daily.     oxyCODONE 5 MG immediate release tablet  Commonly known as:  Oxy IR/ROXICODONE  Take 1-3 tablets (5-15 mg total) by mouth every 4 (four) hours as needed.     oxyCODONE-acetaminophen 5-325 MG per tablet  Commonly known as:  PERCOCET/ROXICET  Take 1-2 tablets by mouth every 6 (six) hours as needed.     propranolol ER 80 MG 24 hr capsule  Commonly known as:  INDERAL LA  Take 80 mg by mouth as needed.     ROZEREM 8 MG tablet  Generic drug:  ramelteon  Take 8 mg by mouth at bedtime.     sertraline 100 MG tablet  Commonly known as:  ZOLOFT  Take 1 tablet (100 mg total) by mouth every morning.     tiZANidine 2 MG tablet  Commonly known as:   ZANAFLEX  Take 2 mg by mouth every 8 (eight) hours as needed.     triamterene-hydrochlorothiazide 37.5-25 MG per tablet  Commonly known as:  MAXZIDE-25  Take 0.5 each (0.5 tablets total) by mouth daily.             Follow-up Information   Follow up with [facility]. Schedule an appointment as soon as possible for a visit in 3 weeks.   Contact information:   [address] [place] [address] [phone]       Signed: Denny Levy [facility] [phone]  02/28/2013, 9:25 AM

## 2013-02-28 NOTE — Care Management Note (Signed)
    Page 1 of 1   02/28/2013     10:30:36 AM   CARE MANAGEMENT NOTE 02/28/2013  Patient:  Kathy Huerta, Kathy Huerta   Account Number:  [id number]  Date Initiated:  02/28/2013  Documentation initiated by:  Lorenda Ishihara  Subjective/Objective Assessment:   52 yo female admitted s/p lap chole.     Action/Plan:   Home when stable   Anticipated DC Date:  02/28/2013   Anticipated DC Plan:  HOME/SELF CARE      DC Planning Services  CM consult      Choice offered to / List presented to:             Status of service:  Completed, signed off Medicare Important Message given?  NA - LOS <3 / Initial given by admissions (If response is "NO", the following Medicare IM given date fields will be blank) Date Medicare IM given:   Date Additional Medicare IM given:    Discharge Disposition:  HOME/SELF CARE  Per UR Regulation:  Reviewed for med. necessity/level of care/duration of stay  If discussed at Long Length of Stay Meetings, dates discussed:    Comments:

## 2013-02-28 NOTE — Progress Notes (Signed)
ANTIBIOTIC CONSULT NOTE - FOLLOW UP  Pharmacy Consult for Gentamicin Indication: Suspected cholecystitis   Allergies  Allergen Reactions  . Amoxicillin-Pot Clavulanate     REACTION: Throat swelling.  . Azithromycin     REACTION: Throat Swelling  . Cefazolin     REACTION: Throat Swelling  . Clarithromycin     REACTION: Throat Swelling  . Moxifloxacin     REACTION: Throat Swelling  . Penicillins     REACTION: throat swelling  . Sulfonamide Derivatives     Patient Measurements: Height: 5' 7.72" (172 cm) (taken from 01/28/13) Weight: 198 lb 3.1 oz (89.9 kg) (taken 01/28/13) IBW/kg (Calculated) : 63.25 Adjusted Body Weight:   Vital Signs: Temp: 98.1 F (36.7 C) (03/10 0116) Temp src: Oral (03/10 0116) BP: 95/45 mmHg (03/10 0116) Pulse Rate: 98 (03/10 0116) Intake/Output from previous day: 03/09 0701 - 03/10 0700 In: 870 [I.V.:870] Out: 750 [Urine:750] Intake/Output from this shift: Total I/O In: 600 [I.V.:600] Out: 400 [Urine:400]  Labs:  Recent Labs  02/27/13 0527 02/27/13 1506  WBC 12.5* 8.8  HGB 13.3 12.2  PLT 290 277  CREATININE 0.60 0.63   Estimated Creatinine Clearance: 96 ml/min (by C-G formula based on Cr of 0.63).  Recent Labs  02/27/13 2326  GENTRANDOM 6.5     Microbiology: Recent Results (from the past 720 hour(s))  SURGICAL PCR SCREEN     Status: Abnormal   Collection Time    02/27/13  4:01 PM      Result Value Range Status   MRSA, PCR NEGATIVE  NEGATIVE Final   Staphylococcus aureus POSITIVE (*) NEGATIVE Final   Comment:            The Xpert SA Assay (FDA     approved for NASAL specimens     in patients over 103 years of age),     is one component of     a comprehensive surveillance     program.  Test performance has     been validated by [employer] for patients greater     than or equal to 70 year old.     It is not intended     to diagnose infection nor to     guide or monitor treatment.    Anti-infectives   Start      Dose/Rate Route Frequency Ordered Stop   02/28/13 0300  clindamycin (CLEOCIN) IVPB 600 mg     600 mg 100 mL/hr over 30 Minutes Intravenous Every 6 hours 02/27/13 2344     02/27/13 2330  clindamycin (CLEOCIN) IVPB 600 mg  Status:  Discontinued     600 mg 100 mL/hr over 30 Minutes Intravenous 4 times per day 02/27/13 2318 02/27/13 2344   02/27/13 1400  gentamicin (GARAMYCIN) 520 mg in dextrose 5 % 100 mL IVPB     520 mg 113 mL/hr over 60 Minutes Intravenous Every 24 hours 02/27/13 1344     02/27/13 1300  [MAR Hold]  clindamycin (CLEOCIN) IVPB 600 mg  Status:  Discontinued     (On MAR Hold since 02/27/13 2025)   600 mg 100 mL/hr over 30 Minutes Intravenous 4 times per day 02/27/13 1214 02/27/13 2357   02/27/13 1215  gentamicin (GARAMYCIN) 2 mg/kg in dextrose 5 % 50 mL IVPB  Status:  Discontinued     2 mg/kg 100 mL/hr over 30 Minutes Intravenous Every 12 hours 02/27/13 1214 02/27/13 1334      Assessment: Patient on Extended Interval dosing  gentamicin.  Dosing interval is q24hr based on nomogram and 7hr level of 6.5.  Goal of Therapy:  Gentamicin dosing per nomogram  Plan:  Measure antibiotic drug levels at steady state Follow up culture results Continue q24hr dosing of gentamicin  Darlina Guys, Jacquenette Shone Crowford 02/28/2013,1:26 AM

## 2013-03-01 ENCOUNTER — Telehealth (INDEPENDENT_AMBULATORY_CARE_PROVIDER_SITE_OTHER): Payer: Self-pay | Admitting: General Surgery

## 2013-03-01 NOTE — Telephone Encounter (Signed)
Pt calling for pain control recommendations since she is in a pain clinic with Dr. Murray Hodgkins.  Pt had emergency lap chole and was discharged with oxycodone IR; she has not taken any and has not been able to reach Dr. Murray Hodgkins for advice.  Called Dr. Murray Hodgkins [phone]) and spoke with Bonita Quin, who updated her physician.  Gave permission for this pt to take the pain meds as ordered by Dr. Michaell Cowing for postop pain.  Called pt back and gave her the update.  Also discussed how she needs to eat starchy foods every 1-2 hours to settle her queasy stomach and sip fluids frequently.  She will call back as needed.

## 2013-03-03 ENCOUNTER — Telehealth (INDEPENDENT_AMBULATORY_CARE_PROVIDER_SITE_OTHER): Payer: Self-pay | Admitting: *Deleted

## 2013-03-03 NOTE — Telephone Encounter (Signed)
Patient called status post lap chole. She complains today that after going to the bathroom she had some vaginal bleeding like she was starting her period. She has not had a period in two years. She has an appt with her OBGYN in one week. I told her to discuss this with them and if turns to bright red blood or flow becomes very heavy to call them sooner. She will call with any other symptoms.   Lesly Rubenstein, CMA

## 2013-03-09 ENCOUNTER — Other Ambulatory Visit (HOSPITAL_COMMUNITY)
Admission: RE | Admit: 2013-03-09 | Discharge: 2013-03-09 | Disposition: A | Discharge status: Home / Self Care | Payer: 59 | Source: Ambulatory Visit | Attending: Obstetrics and Gynecology | Admitting: Obstetrics and Gynecology

## 2013-03-09 ENCOUNTER — Other Ambulatory Visit: Payer: Self-pay | Admitting: Obstetrics and Gynecology

## 2013-03-09 DIAGNOSIS — Z01419 Encounter for gynecological examination (general) (routine) without abnormal findings: Secondary | ICD-10-CM | POA: Insufficient documentation

## 2013-03-09 DIAGNOSIS — Z1151 Encounter for screening for human papillomavirus (HPV): Secondary | ICD-10-CM | POA: Insufficient documentation

## 2013-03-10 ENCOUNTER — Telehealth: Payer: Self-pay | Admitting: Internal Medicine

## 2013-03-10 NOTE — Telephone Encounter (Signed)
Have you checked OR schedule too?

## 2013-03-10 NOTE — Telephone Encounter (Signed)
Please see mine and Dr Elby Showers note

## 2013-03-10 NOTE — Telephone Encounter (Signed)
Patient called in stating that she needs to make a post hospital follow up with Dr. Abner Greenspan. When can we get patient in?

## 2013-03-10 NOTE — Telephone Encounter (Signed)
Patient scheduled an appointment at OR on 03/18/13

## 2013-03-10 NOTE — Telephone Encounter (Signed)
Next 30 minute appointment is past the two week mark. Can I combine two 15 minute slots?

## 2013-03-18 ENCOUNTER — Ambulatory Visit: Payer: 59 | Admitting: Family Medicine

## 2013-03-22 ENCOUNTER — Encounter (INDEPENDENT_AMBULATORY_CARE_PROVIDER_SITE_OTHER): Payer: Self-pay | Admitting: Internal Medicine

## 2013-03-22 ENCOUNTER — Ambulatory Visit (INDEPENDENT_AMBULATORY_CARE_PROVIDER_SITE_OTHER): Payer: 59 | Admitting: Internal Medicine

## 2013-03-22 VITALS — BP 146/96 | HR 98 | Temp 97.0°F | Resp 16 | Ht 67.5 in | Wt 198.0 lb

## 2013-03-22 DIAGNOSIS — K810 Acute cholecystitis: Secondary | ICD-10-CM

## 2013-03-22 DIAGNOSIS — K8020 Calculus of gallbladder without cholecystitis without obstruction: Secondary | ICD-10-CM

## 2013-03-22 NOTE — Patient Instructions (Signed)
May resume regular activity without restrictions. Follow up as needed. Call with questions or concerns.  

## 2013-03-22 NOTE — Progress Notes (Signed)
  Subjective: Pt returns to the clinic today after undergoing laparoscopic cholecystectomy on 02/27/13 by Dr. Michaell Cowing.  The patient is tolerating their diet well and is having no severe pain.  Only having some mild indigestion.  Bowel function is good.  No problems with the wounds.  Objective: Vital signs in last 24 hours: Reviewed  PE: Abd: soft, non-tender, +bs, incisions well healed  Lab Results:  No results found for this basename: WBC, HGB, HCT, PLT,  in the last 72 hours BMET No results found for this basename: NA, K, CL, CO2, GLUCOSE, BUN, CREATININE, CALCIUM,  in the last 72 hours PT/INR No results found for this basename: LABPROT, INR,  in the last 72 hours CMP     Component Value Date/Time   NA 136 02/28/2013 0413   K 3.6 02/28/2013 0413   CL 104 02/28/2013 0413   CO2 25 02/28/2013 0413   GLUCOSE 106* 02/28/2013 0413   BUN 6 02/28/2013 0413   CREATININE 0.72 02/28/2013 0413   CREATININE 0.60 04/14/2012 0945   CALCIUM 7.4* 02/28/2013 0413   PROT 7.5 02/27/2013 0527   ALBUMIN 4.0 02/27/2013 0527   AST 18 02/27/2013 0527   ALT 19 02/27/2013 0527   ALKPHOS 89 02/27/2013 0527   BILITOT 0.3 02/27/2013 0527   GFRNONAA >90 02/28/2013 0413   GFRAA >90 02/28/2013 0413   Lipase     Component Value Date/Time   LIPASE 29 02/27/2013 0527       Studies/Results: No results found.  Anti-infectives: Anti-infectives   None       Assessment/Plan  1.  S/P Laparoscopic Cholecystectomy: doing well, may resume regular activity without restrictions, Pt will follow up with [place] PR[facility] and knows to call with questions or concerns.     WHITE, ELIZABETH 03/22/2013

## 2013-03-28 ENCOUNTER — Ambulatory Visit: Payer: 59 | Admitting: Family Medicine

## 2013-04-08 ENCOUNTER — Other Ambulatory Visit: Payer: Self-pay | Admitting: Internal Medicine

## 2013-04-19 ENCOUNTER — Encounter: Payer: Self-pay | Admitting: Family Medicine

## 2013-04-19 ENCOUNTER — Ambulatory Visit (INDEPENDENT_AMBULATORY_CARE_PROVIDER_SITE_OTHER): Payer: 59 | Admitting: Family Medicine

## 2013-04-19 VITALS — BP 122/72 | HR 98 | Temp 97.8°F | Ht 67.5 in | Wt 195.0 lb

## 2013-04-19 DIAGNOSIS — I10 Essential (primary) hypertension: Secondary | ICD-10-CM

## 2013-04-19 DIAGNOSIS — K812 Acute cholecystitis with chronic cholecystitis: Secondary | ICD-10-CM

## 2013-04-19 DIAGNOSIS — K219 Gastro-esophageal reflux disease without esophagitis: Secondary | ICD-10-CM

## 2013-04-19 DIAGNOSIS — R109 Unspecified abdominal pain: Secondary | ICD-10-CM

## 2013-04-19 LAB — RENAL FUNCTION PANEL
BUN: 11 mg/dL (ref 6–23)
Calcium: 10.8 mg/dL — ABNORMAL HIGH (ref 8.4–10.5)
Glucose, Bld: 91 mg/dL (ref 70–99)
Potassium: 5 mEq/L (ref 3.5–5.3)

## 2013-04-19 LAB — HEPATIC FUNCTION PANEL
ALT: 23 U/L (ref 0–35)
AST: 20 U/L (ref 0–37)
Alkaline Phosphatase: 99 U/L (ref 39–117)
Indirect Bilirubin: 0.4 mg/dL (ref 0.0–0.9)
Total Protein: 7.5 g/dL (ref 6.0–8.3)

## 2013-04-19 LAB — CBC
HCT: 41.6 % (ref 36.0–46.0)
Hemoglobin: 14.3 g/dL (ref 12.0–15.0)
MCV: 85.8 fL (ref 78.0–100.0)
Platelets: 355 10*3/uL (ref 150–400)
RBC: 4.85 MIL/uL (ref 3.87–5.11)
WBC: 7.3 10*3/uL (ref 4.0–10.5)

## 2013-04-19 NOTE — Patient Instructions (Addendum)
Next appt in for procedure (skin tag removal) 1/2 hour in 3 months, labs prior to visit, lipid, renal, tsh, cbc, hepatic   Probiotics daily  Hypertension As your heart beats, it forces blood through your arteries. This force is your blood pressure. If the pressure is too high, it is called hypertension (HTN) or high blood pressure. HTN is dangerous because you may have it and not know it. High blood pressure may mean that your heart has to work harder to pump blood. Your arteries may be narrow or stiff. The extra work puts you at risk for heart disease, stroke, and other problems.  Blood pressure consists of two numbers, a higher number over a lower, 110/72, for example. It is stated as "110 over 72." The ideal is below 120 for the top number (systolic) and under 80 for the bottom (diastolic). Write down your blood pressure today. You should pay close attention to your blood pressure if you have certain conditions such as:  Heart failure.  Prior heart attack.  Diabetes  Chronic kidney disease.  Prior stroke.  Multiple risk factors for heart disease. To see if you have HTN, your blood pressure should be measured while you are seated with your arm held at the level of the heart. It should be measured at least twice. A one-time elevated blood pressure reading (especially in the Emergency Department) does not mean that you need treatment. There may be conditions in which the blood pressure is different between your right and left arms. It is important to see your caregiver soon for a recheck. Most people have essential hypertension which means that there is not a specific cause. This type of high blood pressure may be lowered by changing lifestyle factors such as:  Stress.  Smoking.  Lack of exercise.  Excessive weight.  Drug/tobacco/alcohol use.  Eating less salt. Most people do not have symptoms from high blood pressure until it has caused damage to the body. Effective treatment can  often prevent, delay or reduce that damage. TREATMENT  When a cause has been identified, treatment for high blood pressure is directed at the cause. There are a large number of medications to treat HTN. These fall into several categories, and your caregiver will help you select the medicines that are best for you. Medications may have side effects. You should review side effects with your caregiver. If your blood pressure stays high after you have made lifestyle changes or started on medicines,   Your medication(s) may need to be changed.  Other problems may need to be addressed.  Be certain you understand your prescriptions, and know how and when to take your medicine.  Be sure to follow up with your caregiver within the time frame advised (usually within two weeks) to have your blood pressure rechecked and to review your medications.  If you are taking more than one medicine to lower your blood pressure, make sure you know how and at what times they should be taken. Taking two medicines at the same time can result in blood pressure that is too low. SEEK IMMEDIATE MEDICAL CARE IF:  You develop a severe headache, blurred or changing vision, or confusion.  You have unusual weakness or numbness, or a faint feeling.  You have severe chest or abdominal pain, vomiting, or breathing problems. MAKE SURE YOU:   Understand these instructions.  Will watch your condition.  Will get help right away if you are not doing well or get worse. Document Released: 12/08/2005 Document  Revised: 03/01/2012 Document Reviewed: 07/28/2008 [facility] Patient Information 2013 [place], [place].

## 2013-04-20 ENCOUNTER — Encounter: Payer: Self-pay | Admitting: Family Medicine

## 2013-04-20 NOTE — Assessment & Plan Note (Signed)
Well controlled, no changes, minimize sodium 

## 2013-04-20 NOTE — Assessment & Plan Note (Signed)
Doing well avoid offending foods

## 2013-04-20 NOTE — Assessment & Plan Note (Signed)
Recovering well from recent cholecystectomy after some initial complications

## 2013-04-20 NOTE — Progress Notes (Signed)
Patient ID: Kathy Huerta, female   DOB: 1961/10/24, 52 y.o.   MRN: [id number] Kathy Huerta [id number] [birth year] 04/20/2013      Progress Note-Follow Up  Subjective  Chief Complaint  Chief Complaint  Patient presents with  . Follow-up    2 month    HPI  Patient is a 52 year old Caucasian female in today for two-month followup. She recently had to have an emergency cholecystectomy. She has some initial complications with infection but is now doing well no pain, constipation or fevers. Overall she feels well. No chest pain, palpitations, shortness of breath, GI or GU concerns at this time.  Past Medical History  Diagnosis Date  . HTN (hypertension)   . Depression   . Heartburn   . Basal cell carcinoma 11/17/11    Dr. Naoma Diener  . Headache 01/29/2013  . DDD (degenerative disc disease), thoracolumbar     on disability  . DDD (degenerative disc disease), cervical     s/p fusion  . Pancreatitis 04/24/2011    Past Surgical History  Procedure Laterality Date  . Anterior fusion cervical spine  2008    C6-7  Dr Wynetta Emery  . Posterior fusion cervical spine  2011    C6-7  . Cholecystectomy N/A 02/27/2013    Procedure: LAPAROSCOPIC CHOLECYSTECTOMY WITH INTRAOPERATIVE CHOLANGIOGRAM;  Surgeon: Ardeth Sportsman, MD;  Location: [facility] ORS;  Service: General;  Laterality: N/A;    Family History  Problem Relation Age of Onset  . Hypertension Mother   . Hyperlipidemia Mother   . Other Father     DDD, colectomy,cardiac stent, afib  . Hypertension Brother   . Colon cancer Neg Hx   . Stomach cancer Neg Hx     History   Social History  . Marital Status: Married    Spouse Name: N/A    Number of Children: N/A  . Years of Education: N/A   Occupational History  . Not on file.   Social History Main Topics  . Smoking status: Former Games developer  . Smokeless tobacco: Never Used  . Alcohol Use: 3.0 oz/week    5 Cans of beer per week  . Drug Use: No  . Sexually Active: Not on file   Other Topics  Concern  . Not on file   Social History Narrative   Reviewed history and no changes required.   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   Regular exercise-no   Smoking Status:  quit   Caffeine use/day:  none   Does Patient Exercise:  no          Current Outpatient Prescriptions on File Prior to Visit  Medication Sig Dispense Refill  . cyclobenzaprine (FLEXERIL) 10 MG tablet Take 10 mg by mouth daily.      . meloxicam (MOBIC) 7.5 MG tablet Take 7.5 mg by mouth daily.      Marland Kitchen olmesartan (BENICAR) 20 MG tablet Take 1 tablet (20 mg total) by mouth daily.  90 tablet  1  . oxyCODONE (OXY IR/ROXICODONE) 5 MG immediate release tablet Take 1-3 tablets (5-15 mg total) by mouth every 4 (four) hours as needed.  60 tablet  0  . propranolol ER (INDERAL LA) 80 MG 24 hr capsule Take 80 mg by mouth as needed.       . ramelteon (ROZEREM) 8 MG tablet Take 8 mg by mouth at bedtime.        Marland Kitchen  sertraline (ZOLOFT) 100 MG tablet Take 1 tablet (100 mg total) by mouth every morning.  30 tablet  11  . tiZANidine (ZANAFLEX) 2 MG tablet Take 2 mg by mouth every 8 (eight) hours as needed.      . triamterene-hydrochlorothiazide (MAXZIDE-25) 37.5-25 MG per tablet TAKE ONE-HALF TABLET DAILY  45 tablet  0   No current facility-administered medications on file prior to visit.    Allergies  Allergen Reactions  . Amoxicillin-Pot Clavulanate     REACTION: Throat swelling.  . Azithromycin     REACTION: Throat Swelling  . Cefazolin     REACTION: Throat Swelling  . Clarithromycin     REACTION: Throat Swelling  . Moxifloxacin     REACTION: Throat Swelling  . Penicillins     REACTION: throat swelling  . Sulfonamide Derivatives     Review of Systems  Review of Systems  Constitutional: Negative for fever and malaise/fatigue.  HENT: Negative for congestion.   Eyes: Negative for discharge.  Respiratory: Negative  for shortness of breath.   Cardiovascular: Negative for chest pain, palpitations and leg swelling.  Gastrointestinal: Negative for nausea, abdominal pain and diarrhea.  Genitourinary: Negative for dysuria.  Musculoskeletal: Negative for falls.  Skin: Negative for rash.  Neurological: Negative for loss of consciousness and headaches.  Endo/Heme/Allergies: Negative for polydipsia.  Psychiatric/Behavioral: Negative for depression and suicidal ideas. The patient is not nervous/anxious and does not have insomnia.     Objective  BP 122/72  Pulse 98  Temp(Src) 97.8 F (36.6 C) (Oral)  Ht 5' 7.5" (1.715 m)  Wt 195 lb 0.6 oz (88.47 kg)  BMI 30.08 kg/m2  SpO2 98%  LMP 09/23/2011  Physical Exam  Physical Exam  Constitutional: She is oriented to person, place, and time and well-developed, well-nourished, and in no distress. No distress.  HENT:  Head: Normocephalic and atraumatic.  Eyes: Conjunctivae are normal.  Neck: Neck supple. No thyromegaly present.  Cardiovascular: Normal rate, regular rhythm and normal heart sounds.   No murmur heard. Pulmonary/Chest: Effort normal and breath sounds normal. She has no wheezes.  Abdominal: She exhibits no distension and no mass.  Musculoskeletal: She exhibits no edema.  Lymphadenopathy:    She has no cervical adenopathy.  Neurological: She is alert and oriented to person, place, and time.  Skin: Skin is warm and dry. No rash noted. She is not diaphoretic.  Psychiatric: Memory, affect and judgment normal.    Lab Results  Component Value Date   TSH 3.400 04/14/2012   Lab Results  Component Value Date   WBC 7.3 04/19/2013   HGB 14.3 04/19/2013   HCT 41.6 04/19/2013   MCV 85.8 04/19/2013   PLT 355 04/19/2013   Lab Results  Component Value Date   CREATININE 0.69 04/19/2013   BUN 11 04/19/2013   NA 138 04/19/2013   K 5.0 04/19/2013   CL 101 04/19/2013   CO2 29 04/19/2013   Lab Results  Component Value Date   ALT 23 04/19/2013   AST 20  04/19/2013   ALKPHOS 99 04/19/2013   BILITOT 0.5 04/19/2013   Lab Results  Component Value Date   CHOL 263* 04/14/2012   Lab Results  Component Value Date   HDL 64 04/14/2012   Lab Results  Component Value Date   LDLCALC 162* 04/14/2012   Lab Results  Component Value Date   TRIG 183* 04/14/2012   Lab Results  Component Value Date   CHOLHDL 4.1 04/14/2012     Assessment &  Plan  Acute cholecystitis with chronic cholecystitis Recovering well from recent cholecystectomy after some initial complications  Hypertension Well controlled, no changes, minimize sodium  GERD (gastroesophageal reflux disease) Doing well avoid offending foods

## 2013-05-14 ENCOUNTER — Other Ambulatory Visit: Payer: Self-pay | Admitting: Internal Medicine

## 2013-07-01 ENCOUNTER — Ambulatory Visit (INDEPENDENT_AMBULATORY_CARE_PROVIDER_SITE_OTHER): Payer: 59 | Admitting: Nurse Practitioner

## 2013-07-01 ENCOUNTER — Encounter: Payer: Self-pay | Admitting: Nurse Practitioner

## 2013-07-01 VITALS — BP 104/72 | HR 113 | Temp 98.4°F | Resp 16 | Wt 200.0 lb

## 2013-07-01 DIAGNOSIS — J189 Pneumonia, unspecified organism: Secondary | ICD-10-CM

## 2013-07-01 DIAGNOSIS — R0989 Other specified symptoms and signs involving the circulatory and respiratory systems: Secondary | ICD-10-CM

## 2013-07-01 MED ORDER — ALBUTEROL SULFATE (5 MG/ML) 0.5% IN NEBU
2.5000 mg | INHALATION_SOLUTION | Freq: Once | RESPIRATORY_TRACT | Status: AC
  Administered 2013-07-01: 2.5 mg via RESPIRATORY_TRACT

## 2013-07-01 MED ORDER — ALBUTEROL SULFATE (2.5 MG/3ML) 0.083% IN NEBU
2.5000 mg | INHALATION_SOLUTION | Freq: Once | RESPIRATORY_TRACT | Status: AC
  Administered 2013-07-01: 2.5 mg via RESPIRATORY_TRACT

## 2013-07-01 MED ORDER — METHYLPREDNISOLONE ACETATE 40 MG/ML IJ SUSP
40.0000 mg | Freq: Once | INTRAMUSCULAR | Status: AC
  Administered 2013-07-01: 40 mg via INTRAMUSCULAR

## 2013-07-01 MED ORDER — IPRATROPIUM BROMIDE 0.02 % IN SOLN
0.5000 mg | Freq: Once | RESPIRATORY_TRACT | Status: AC
  Administered 2013-07-01: 0.5 mg via RESPIRATORY_TRACT

## 2013-07-01 MED ORDER — IPRATROPIUM BROMIDE 0.02 % IN SOLN: 0.5000 mg | Freq: Once | RESPIRATORY_TRACT | Status: DC

## 2013-07-01 MED ORDER — DOXYCYCLINE HYCLATE 100 MG PO TABS: 100.0000 mg | ORAL_TABLET | Freq: Two times a day (BID) | ORAL | Status: DC

## 2013-07-01 MED ORDER — ALBUTEROL SULFATE (5 MG/ML) 0.5% IN NEBU: 5.0000 mg | INHALATION_SOLUTION | Freq: Once | RESPIRATORY_TRACT | Status: DC

## 2013-07-01 NOTE — Progress Notes (Signed)
Subjective:     Kathy Huerta is a 52 y.o. female who presents for evaluation of dyspnea, nasal congestion, productive cough and fatigue. Symptoms began 7 days ago. Symptoms have been gradually worsening since that time. Past history is significant for pneumonia and pleurisy 20 yrs ago.  The following portions of the patient's history were reviewed and updated as appropriate: allergies, current medications, past medical history, past surgical history and problem list.  Review of Systems Constitutional: positive for chills, fatigue, sweats and perimenopausal, negative for anorexia and weight loss Ears, nose, mouth, throat, and face: positive for nasal congestion and mild, negative for earaches and sore throat Respiratory: positive for cough, dyspnea on exertion, wheezing and rattle in chest w/deep inspiration Cardiovascular: negative for chest pain, chest pressure/discomfort and irregular heart beat Gastrointestinal: negative for abdominal pain and change in bowel habits Genitourinary:negative for dysuria Integument/breast: negative for rash Hematologic/lymphatic: negative for lymphadenopathy    Objective:    BP 104/72  Pulse 113  Temp(Src) 98.4 F (36.9 C) (Oral)  Resp 16  Wt 200 lb (90.719 kg)  BMI 30.84 kg/m2  SpO2 95%  LMP 09/23/2011 General appearance: alert, cooperative, appears stated age, flushed and sweaty-fanning self-said having "hot flash" Head: Normocephalic, without obvious abnormality, atraumatic Eyes: negative findings: lids and lashes normal, conjunctivae and sclerae normal, corneas clear and pupils equal, round, reactive to light and accomodation Ears: normal TM's and external ear canals both ears Nose: Nares normal. Septum midline. Mucosa normal. No drainage or sinus tenderness. Throat: lips, mucosa, and tongue normal; teeth and gums normal Lungs: rhonchi RML and RUL Heart: regular rate and rhythm, S1, S2 normal, no murmur, click, rub or gallop Abdomen: soft,  non-tender; bowel sounds normal; no masses,  no organomegaly Extremities: extremities normal, atraumatic, no cyanosis or edema Lymph nodes: Cervical, supraclavicular, and axillary nodes normal.    Assessment:    Pneumonia   More air movement after breathing treatment, rales not clearing with cough. No CXR as pt has had considerable radiation from CT scans in past.  Plan:  Start Doxy. 40 mg IM depo-med in ofc today. Breathing Tx in ofc today. F/u 2 weeks or sooner if feeling worse.  Worsening signs and symptoms discussed. Rest, fluids, acetaminophen, and humidification.

## 2013-07-01 NOTE — Patient Instructions (Addendum)
I think you have pneumonia. I don't want to do a chest xray because you have had a considerable amount of radiation exposure with Ct scans. I want you to follow up in 2 weeks or sooner if you are feeling worse. Rest, sips fluids. Feel better!  Pneumonia, Adult Pneumonia is an infection of the lungs.  CAUSES Pneumonia may be caused by bacteria or a virus. Usually, these infections are caused by breathing infectious particles into the lungs (respiratory tract). SYMPTOMS   Cough.  Fever.  Chest pain.  Increased rate of breathing.  Wheezing.  Mucus production. DIAGNOSIS  If you have the common symptoms of pneumonia, your caregiver will typically confirm the diagnosis with a chest X-ray. The X-ray will show an abnormality in the lung (pulmonary infiltrate) if you have pneumonia. Other tests of your blood, urine, or sputum may be done to find the specific cause of your pneumonia. Your caregiver may also do tests (blood gases or pulse oximetry) to see how well your lungs are working. TREATMENT  Some forms of pneumonia may be spread to other people when you cough or sneeze. You may be asked to wear a mask before and during your exam. Pneumonia that is caused by bacteria is treated with antibiotic medicine. Pneumonia that is caused by the influenza virus may be treated with an antiviral medicine. Most other viral infections must run their course. These infections will not respond to antibiotics.  PREVENTION A pneumococcal shot (vaccine) is available to prevent a common bacterial cause of pneumonia. This is usually suggested for:  People over 76 years old.  Patients on chemotherapy.  People with chronic lung problems, such as bronchitis or emphysema.  People with immune system problems. If you are over 65 or have a high risk condition, you may receive the pneumococcal vaccine if you have not received it before. In some countries, a routine influenza vaccine is also recommended. This vaccine  can help prevent some cases of pneumonia.You may be offered the influenza vaccine as part of your care. If you smoke, it is time to quit. You may receive instructions on how to stop smoking. Your caregiver can provide medicines and counseling to help you quit. HOME CARE INSTRUCTIONS   Cough suppressants may be used if you are losing too much rest. However, coughing protects you by clearing your lungs. You should avoid using cough suppressants if you can.  Your caregiver may have prescribed medicine if he or she thinks your pneumonia is caused by a bacteria or influenza. Finish your medicine even if you start to feel better.  Your caregiver may also prescribe an expectorant. This loosens the mucus to be coughed up.  Only take over-the-counter or prescription medicines for pain, discomfort, or fever as directed by your caregiver.  Do not smoke. Smoking is a common cause of bronchitis and can contribute to pneumonia. If you are a smoker and continue to smoke, your cough may last several weeks after your pneumonia has cleared.  A cold steam vaporizer or humidifier in your room or home may help loosen mucus.  Coughing is often worse at night. Sleeping in a semi-upright position in a recliner or using a couple pillows under your head will help with this.  Get rest as you feel it is needed. Your body will usually let you know when you need to rest. SEEK IMMEDIATE MEDICAL CARE IF:   Your illness becomes worse. This is especially true if you are elderly or weakened from any other disease.  You cannot control your cough with suppressants and are losing sleep.  You begin coughing up blood.  You develop pain which is getting worse or is uncontrolled with medicines.  You have a fever.  Any of the symptoms which initially brought you in for treatment are getting worse rather than better.  You develop shortness of breath or chest pain. MAKE SURE YOU:   Understand these instructions.  Will  watch your condition.  Will get help right away if you are not doing well or get worse. Document Released: 12/08/2005 Document Revised: 03/01/2012 Document Reviewed: 02/27/2011 [facility] Patient Information 2014 [place], [place].

## 2013-07-11 ENCOUNTER — Other Ambulatory Visit: Payer: Self-pay | Admitting: Family Medicine

## 2013-07-18 ENCOUNTER — Telehealth: Payer: Self-pay

## 2013-07-18 DIAGNOSIS — I10 Essential (primary) hypertension: Secondary | ICD-10-CM

## 2013-07-18 DIAGNOSIS — E785 Hyperlipidemia, unspecified: Secondary | ICD-10-CM

## 2013-07-18 LAB — HEPATIC FUNCTION PANEL
ALT: 22 U/L (ref 0–35)
Alkaline Phosphatase: 90 U/L (ref 39–117)
Indirect Bilirubin: 0.4 mg/dL (ref 0.0–0.9)
Total Protein: 7.4 g/dL (ref 6.0–8.3)

## 2013-07-18 LAB — LIPID PANEL
Cholesterol: 235 mg/dL — ABNORMAL HIGH (ref 0–200)
HDL: 71 mg/dL (ref >39)
LDL Cholesterol: 137 mg/dL — ABNORMAL HIGH (ref 0–99)
Triglycerides: 137 mg/dL (ref <150)

## 2013-07-18 LAB — CBC
HCT: 42.1 % (ref 36.0–46.0)
MCH: 29.7 pg (ref 26.0–34.0)
MCV: 88.1 fL (ref 78.0–100.0)
Platelets: 341 10*3/uL (ref 150–400)
RDW: 14.2 % (ref 11.5–15.5)
WBC: 8.9 10*3/uL (ref 4.0–10.5)

## 2013-07-18 LAB — RENAL FUNCTION PANEL
Albumin: 4.5 g/dL (ref 3.5–5.2)
CO2: 26 mEq/L (ref 19–32)
Chloride: 105 mEq/L (ref 96–112)
Creat: 0.69 mg/dL (ref 0.50–1.10)
Glucose, Bld: 74 mg/dL (ref 70–99)

## 2013-07-18 NOTE — Telephone Encounter (Signed)
Lab order placed.

## 2013-07-21 ENCOUNTER — Encounter: Payer: Self-pay | Admitting: Family Medicine

## 2013-07-21 ENCOUNTER — Ambulatory Visit (INDEPENDENT_AMBULATORY_CARE_PROVIDER_SITE_OTHER): Payer: 59 | Admitting: Family Medicine

## 2013-07-21 VITALS — BP 102/84 | HR 86 | Temp 98.1°F | Ht 67.5 in | Wt 197.1 lb

## 2013-07-21 DIAGNOSIS — I10 Essential (primary) hypertension: Secondary | ICD-10-CM

## 2013-07-21 DIAGNOSIS — L909 Atrophic disorder of skin, unspecified: Secondary | ICD-10-CM

## 2013-07-21 DIAGNOSIS — L918 Other hypertrophic disorders of the skin: Secondary | ICD-10-CM

## 2013-07-21 DIAGNOSIS — J189 Pneumonia, unspecified organism: Secondary | ICD-10-CM

## 2013-07-21 MED ORDER — ALBUTEROL SULFATE HFA 108 (90 BASE) MCG/ACT IN AERS: 2.0000 | INHALATION_SPRAY | Freq: Four times a day (QID) | RESPIRATORY_TRACT | Status: DC | PRN

## 2013-07-21 NOTE — Patient Instructions (Addendum)
Start MegaRed krill oil caps daily by [employer] daily Consider Red Yeast Rice  Labs prior to next visit lipid, renal, cbc, tsh, hepatic  Pneumonia, Adult Pneumonia is an infection of the lungs.  CAUSES Pneumonia may be caused by bacteria or a virus. Usually, these infections are caused by breathing infectious particles into the lungs (respiratory tract). SYMPTOMS   Cough.  Fever.  Chest pain.  Increased rate of breathing.  Wheezing.  Mucus production. DIAGNOSIS  If you have the common symptoms of pneumonia, your caregiver will typically confirm the diagnosis with a chest X-ray. The X-ray will show an abnormality in the lung (pulmonary infiltrate) if you have pneumonia. Other tests of your blood, urine, or sputum may be done to find the specific cause of your pneumonia. Your caregiver may also do tests (blood gases or pulse oximetry) to see how well your lungs are working. TREATMENT  Some forms of pneumonia may be spread to other people when you cough or sneeze. You may be asked to wear a mask before and during your exam. Pneumonia that is caused by bacteria is treated with antibiotic medicine. Pneumonia that is caused by the influenza virus may be treated with an antiviral medicine. Most other viral infections must run their course. These infections will not respond to antibiotics.  PREVENTION A pneumococcal shot (vaccine) is available to prevent a common bacterial cause of pneumonia. This is usually suggested for:  People over 58 years old.  Patients on chemotherapy.  People with chronic lung problems, such as bronchitis or emphysema.  People with immune system problems. If you are over 65 or have a high risk condition, you may receive the pneumococcal vaccine if you have not received it before. In some countries, a routine influenza vaccine is also recommended. This vaccine can help prevent some cases of pneumonia.You may be offered the influenza vaccine as part of  your care. If you smoke, it is time to quit. You may receive instructions on how to stop smoking. Your caregiver can provide medicines and counseling to help you quit. HOME CARE INSTRUCTIONS   Cough suppressants may be used if you are losing too much rest. However, coughing protects you by clearing your lungs. You should avoid using cough suppressants if you can.  Your caregiver may have prescribed medicine if he or she thinks your pneumonia is caused by a bacteria or influenza. Finish your medicine even if you start to feel better.  Your caregiver may also prescribe an expectorant. This loosens the mucus to be coughed up.  Only take over-the-counter or prescription medicines for pain, discomfort, or fever as directed by your caregiver.  Do not smoke. Smoking is a common cause of bronchitis and can contribute to pneumonia. If you are a smoker and continue to smoke, your cough may last several weeks after your pneumonia has cleared.  A cold steam vaporizer or humidifier in your room or home may help loosen mucus.  Coughing is often worse at night. Sleeping in a semi-upright position in a recliner or using a couple pillows under your head will help with this.  Get rest as you feel it is needed. Your body will usually let you know when you need to rest. SEEK IMMEDIATE MEDICAL CARE IF:   Your illness becomes worse. This is especially true if you are elderly or weakened from any other disease.  You cannot control your cough with suppressants and are losing sleep.  You begin coughing up blood.  You develop pain  which is getting worse or is uncontrolled with medicines.  You have a fever.  Any of the symptoms which initially brought you in for treatment are getting worse rather than better.  You develop shortness of breath or chest pain. MAKE SURE YOU:   Understand these instructions.  Will watch your condition.  Will get help right away if you are not doing well or get  worse. Document Released: 12/08/2005 Document Revised: 03/01/2012 Document Reviewed: 02/27/2011 [facility] Patient Information 2014 [place], [place].

## 2013-07-22 NOTE — Progress Notes (Signed)
Patient ID: Kathy Huerta, female   DOB: Aug 02, 1961, 52 y.o.   MRN: [id number] Kathy Huerta [id number] 17-Apr-1961 07/22/2013      Progress Note-Follow Up  Subjective  Chief Complaint  No chief complaint on file.   HPI  Patient is a 52 year old Caucasian female who is in today for followup. She had been struggling with her symptoms are greatly improved. She does have some mild cough and shortness of breath no fevers or malaise. No rhinorrhea her sertraline. No chest pain, palpitations, shortness of breath, GI or GU concerns are noted. Does have a couple of irritated skin tags on her right chest which catch on jewelry and clothing she like to have removed. No other concerns, reflux well ocntrolled  Past Medical History  Diagnosis Date  . HTN (hypertension)   . Depression   . Heartburn   . Basal cell carcinoma 11/17/11    Dr. Naoma Diener  . Headache(784.0) 01/29/2013  . DDD (degenerative disc disease), thoracolumbar     on disability  . DDD (degenerative disc disease), cervical     s/p fusion  . Pancreatitis 04/24/2011    Past Surgical History  Procedure Laterality Date  . Anterior fusion cervical spine  2008    C6-7  Dr Wynetta Emery  . Posterior fusion cervical spine  2011    C6-7  . Cholecystectomy N/A 02/27/2013    Procedure: LAPAROSCOPIC CHOLECYSTECTOMY WITH INTRAOPERATIVE CHOLANGIOGRAM;  Surgeon: Ardeth Sportsman, MD;  Location: [facility];  Service: General;  Laterality: N/A;    Family History  Problem Relation Age of Onset  . Hypertension Mother   . Hyperlipidemia Mother   . Other Father     DDD, colectomy,cardiac stent, afib  . Hypertension Brother   . Colon cancer Neg Hx   . Stomach cancer Neg Hx     History   Social History  . Marital Status: Married    Spouse Name: N/A    Number of Children: N/A  . Years of Education: N/A   Occupational History  . Not on file.   Social History Main Topics  . Smoking status: Never Smoker   . Smokeless tobacco: Never Used  . Alcohol Use:  3.0 oz/week    5 Cans of beer per week  . Drug Use: No  . Sexually Active: Not on file   Other Topics Concern  . Not on file   Social History Narrative   Reviewed history and no changes required.   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   Regular exercise-no   Smoking Status:  quit   Caffeine use/day:  none   Does Patient Exercise:  no          Current Outpatient Prescriptions on File Prior to Visit  Medication Sig Dispense Refill  . cyclobenzaprine (FLEXERIL) 10 MG tablet Take 10 mg by mouth daily.      . meloxicam (MOBIC) 7.5 MG tablet Take 7.5 mg by mouth daily.      Marland Kitchen olmesartan (BENICAR) 20 MG tablet Take 1 tablet (20 mg total) by mouth daily.  90 tablet  1  . ramelteon (ROZEREM) 8 MG tablet Take 8 mg by mouth at bedtime.        . sertraline (ZOLOFT) 100 MG tablet TAKE 1 TABLET (100 MG TOTAL) BY MOUTH EVERY MORNING.  90 tablet  0  . tiZANidine (ZANAFLEX) 2 MG tablet Take 2  mg by mouth every 8 (eight) hours as needed.      . triamterene-hydrochlorothiazide (MAXZIDE-25) 37.5-25 MG per tablet TAKE ONE-HALF TABLET DAILY  45 tablet  5  . propranolol ER (INDERAL LA) 80 MG 24 hr capsule Take 80 mg by mouth as needed.        No current facility-administered medications on file prior to visit.    Allergies  Allergen Reactions  . Amoxicillin-Pot Clavulanate     REACTION: Throat swelling.  . Azithromycin     REACTION: Throat Swelling  . Cefazolin     REACTION: Throat Swelling  . Clarithromycin     REACTION: Throat Swelling  . Moxifloxacin     REACTION: Throat Swelling  . Penicillins     REACTION: throat swelling  . Sulfonamide Derivatives     Review of Systems  Review of Systems  Constitutional: Negative for fever and malaise/fatigue.  HENT: Negative for congestion.   Eyes: Negative for discharge.  Respiratory: Positive for cough and shortness of breath.    Cardiovascular: Negative for chest pain, palpitations and leg swelling.  Gastrointestinal: Negative for nausea, abdominal pain and diarrhea.  Genitourinary: Negative for dysuria.  Musculoskeletal: Negative for falls.  Skin: Positive for rash.  Neurological: Negative for loss of consciousness and headaches.  Endo/Heme/Allergies: Negative for polydipsia.  Psychiatric/Behavioral: Negative for depression and suicidal ideas. The patient is not nervous/anxious and does not have insomnia.     Objective  BP 102/84  Pulse 109  Temp(Src) 98.1 F (36.7 C) (Oral)  Ht 5' 7.5" (1.715 m)  Wt 197 lb 1.9 oz (89.413 kg)  BMI 30.4 kg/m2  SpO2 95%  LMP 09/23/2011  Physical Exam  Physical Exam  Constitutional: She is oriented to person, place, and time and well-developed, well-nourished, and in no distress. No distress.  HENT:  Head: Normocephalic and atraumatic.  Eyes: Conjunctivae are normal.  Neck: Neck supple. No thyromegaly present.  Cardiovascular: Normal rate, regular rhythm and normal heart sounds.  Exam reveals no gallop.   No murmur heard. Pulmonary/Chest: Effort normal and breath sounds normal. She has no wheezes.  Abdominal: She exhibits no distension and no mass.  Musculoskeletal: She exhibits no edema.  Lymphadenopathy:    She has no cervical adenopathy.  Neurological: She is alert and oriented to person, place, and time.  Skin: Skin is warm and dry. No rash noted. She is not diaphoretic.  2 skin tags. Right axillae and right anterior chest wall  Psychiatric: Memory, affect and judgment normal.    Lab Results  Component Value Date   TSH 2.347 07/18/2013   Lab Results  Component Value Date   WBC 8.9 07/18/2013   HGB 14.2 07/18/2013   HCT 42.1 07/18/2013   MCV 88.1 07/18/2013   PLT 341 07/18/2013   Lab Results  Component Value Date   CREATININE 0.69 07/18/2013   BUN 11 07/18/2013   NA 141 07/18/2013   K 4.5 07/18/2013   CL 105 07/18/2013   CO2 26 07/18/2013   Lab Results   Component Value Date   ALT 22 07/18/2013   AST 19 07/18/2013   ALKPHOS 90 07/18/2013   BILITOT 0.5 07/18/2013   Lab Results  Component Value Date   CHOL 235* 07/18/2013   Lab Results  Component Value Date   HDL 71 07/18/2013   Lab Results  Component Value Date   LDLCALC 137* 07/18/2013   Lab Results  Component Value Date   TRIG 137 07/18/2013   Lab Results  Component  Value Date   CHOLHDL 3.3 07/18/2013     Assessment & Plan  Hypertension Well controled today no changes  Cutaneous skin tags Easily inflamed, both subjected to cryotherapy patient tolerated well  Pneumonia Improving no further treatment at this time, report worsening  Symptoms. Start a probiotic

## 2013-07-24 ENCOUNTER — Encounter: Payer: Self-pay | Admitting: Family Medicine

## 2013-07-24 DIAGNOSIS — L918 Other hypertrophic disorders of the skin: Secondary | ICD-10-CM

## 2013-07-24 DIAGNOSIS — J189 Pneumonia, unspecified organism: Secondary | ICD-10-CM

## 2013-07-24 HISTORY — DX: Pneumonia, unspecified organism: J18.9

## 2013-07-24 HISTORY — DX: Other hypertrophic disorders of the skin: L91.8

## 2013-07-24 NOTE — Assessment & Plan Note (Signed)
Well controled today no changes

## 2013-07-24 NOTE — Assessment & Plan Note (Signed)
Easily inflamed, both subjected to cryotherapy patient tolerated well

## 2013-07-24 NOTE — Assessment & Plan Note (Signed)
Improving no further treatment at this time, report worsening  Symptoms. Start a probiotic

## 2013-08-16 ENCOUNTER — Other Ambulatory Visit: Payer: Self-pay | Admitting: Family Medicine

## 2013-09-11 ENCOUNTER — Other Ambulatory Visit: Payer: Self-pay | Admitting: Family Medicine

## 2013-10-16 ENCOUNTER — Other Ambulatory Visit: Payer: Self-pay | Admitting: Family Medicine

## 2013-10-26 ENCOUNTER — Ambulatory Visit (INDEPENDENT_AMBULATORY_CARE_PROVIDER_SITE_OTHER): Payer: 59

## 2013-10-26 DIAGNOSIS — Z23 Encounter for immunization: Secondary | ICD-10-CM

## 2013-11-11 ENCOUNTER — Telehealth: Payer: Self-pay | Admitting: *Deleted

## 2013-11-11 DIAGNOSIS — I10 Essential (primary) hypertension: Secondary | ICD-10-CM

## 2013-11-11 DIAGNOSIS — E785 Hyperlipidemia, unspecified: Secondary | ICD-10-CM

## 2013-11-11 LAB — CBC
HCT: 40.7 % (ref 36.0–46.0)
Hemoglobin: 14.2 g/dL (ref 12.0–15.0)
MCH: 31.6 pg (ref 26.0–34.0)
MCHC: 34.9 g/dL (ref 30.0–36.0)
MCV: 90.4 fL (ref 78.0–100.0)
Platelets: 309 10*3/uL (ref 150–400)
RBC: 4.5 MIL/uL (ref 3.87–5.11)
RDW: 13.4 % (ref 11.5–15.5)
WBC: 6 10*3/uL (ref 4.0–10.5)

## 2013-11-11 LAB — BASIC METABOLIC PANEL
BUN: 12 mg/dL (ref 6–23)
CO2: 27 mEq/L (ref 19–32)
Calcium: 9.8 mg/dL (ref 8.4–10.5)
Chloride: 104 mEq/L (ref 96–112)
Creat: 0.71 mg/dL (ref 0.50–1.10)
Glucose, Bld: 101 mg/dL — ABNORMAL HIGH (ref 70–99)
Potassium: 5.1 mEq/L (ref 3.5–5.3)
Sodium: 143 mEq/L (ref 135–145)

## 2013-11-11 LAB — HEPATIC FUNCTION PANEL
Alkaline Phosphatase: 86 U/L (ref 39–117)
Bilirubin, Direct: 0.1 mg/dL (ref 0.0–0.3)
Indirect Bilirubin: 0.4 mg/dL (ref 0.0–0.9)
Total Bilirubin: 0.5 mg/dL (ref 0.3–1.2)

## 2013-11-11 LAB — LIPID PANEL
HDL: 61 mg/dL (ref >39)
LDL Cholesterol: 153 mg/dL — ABNORMAL HIGH (ref 0–99)
Total CHOL/HDL Ratio: 4.1 Ratio

## 2013-11-11 NOTE — Telephone Encounter (Signed)
Lab orders placed at request of [facility]

## 2013-11-15 ENCOUNTER — Ambulatory Visit (INDEPENDENT_AMBULATORY_CARE_PROVIDER_SITE_OTHER): Payer: 59 | Admitting: Family Medicine

## 2013-11-15 ENCOUNTER — Encounter: Payer: Self-pay | Admitting: Family Medicine

## 2013-11-15 VITALS — BP 102/82 | HR 96 | Temp 98.0°F | Ht 67.5 in | Wt 201.1 lb

## 2013-11-15 DIAGNOSIS — F32A Depression, unspecified: Secondary | ICD-10-CM

## 2013-11-15 DIAGNOSIS — Z23 Encounter for immunization: Secondary | ICD-10-CM

## 2013-11-15 DIAGNOSIS — I10 Essential (primary) hypertension: Secondary | ICD-10-CM

## 2013-11-15 DIAGNOSIS — E785 Hyperlipidemia, unspecified: Secondary | ICD-10-CM

## 2013-11-15 DIAGNOSIS — M25519 Pain in unspecified shoulder: Secondary | ICD-10-CM

## 2013-11-15 DIAGNOSIS — M25512 Pain in left shoulder: Secondary | ICD-10-CM

## 2013-11-15 DIAGNOSIS — F329 Major depressive disorder, single episode, unspecified: Secondary | ICD-10-CM

## 2013-11-15 DIAGNOSIS — K219 Gastro-esophageal reflux disease without esophagitis: Secondary | ICD-10-CM

## 2013-11-15 MED ORDER — SERTRALINE HCL 100 MG PO TABS: 100.0000 mg | ORAL_TABLET | Freq: Every day | ORAL | Status: DC

## 2013-11-15 MED ORDER — TRIAMTERENE-HCTZ 37.5-25 MG PO TABS: 0.5000 | ORAL_TABLET | Freq: Every day | ORAL | Status: DC

## 2013-11-15 MED ORDER — OLMESARTAN MEDOXOMIL 20 MG PO TABS: 10.0000 mg | ORAL_TABLET | Freq: Every day | ORAL | Status: DC

## 2013-11-15 NOTE — Progress Notes (Signed)
Pre visit review using our clinic review tool, if applicable. No additional management support is needed unless otherwise documented below in the visit note. 

## 2013-11-15 NOTE — Patient Instructions (Signed)

## 2013-11-20 ENCOUNTER — Encounter: Payer: Self-pay | Admitting: Family Medicine

## 2013-11-20 DIAGNOSIS — M25519 Pain in unspecified shoulder: Secondary | ICD-10-CM

## 2013-11-20 HISTORY — DX: Pain in unspecified shoulder: M25.519

## 2013-11-20 NOTE — Assessment & Plan Note (Signed)
Referred to sports med for further consideration may continue pain meds prn

## 2013-11-20 NOTE — Assessment & Plan Note (Signed)
Not taking her Red Yeast Rice regularly is going to start and encouraged avoid trans fats and take krill oil regularly.

## 2013-11-20 NOTE — Assessment & Plan Note (Signed)
C/o some dysphagia as well and is offered referral but declines. Avoid offending foods, minimize NSAIDs

## 2013-11-20 NOTE — Progress Notes (Signed)
Patient ID: Kathy Huerta, female   DOB: November 29, 1961, 52 y.o.   MRN: [id number] Kathy Huerta [id number] 05/31/1961 11/20/2013      Progress Note-Follow Up  Subjective  Chief Complaint  Chief Complaint  Patient presents with  . Follow-up    4 month  . Injections    prevnar    HPI  Patient is a 52 yo caucasian female in today for follow up. Is continuing to struggle with chronic pain. Has neck and back pain. Is also struggling with shoulder pain. No recent illness, no fevers, chills, cp, palp, sob, GI or GU c/o. Taking meds as prescribed.  Past Medical History  Diagnosis Date  . HTN (hypertension)   . Depression   . Heartburn   . Basal cell carcinoma 11/17/11    Dr. Naoma Diener  . Headache(784.0) 01/29/2013  . DDD (degenerative disc disease), thoracolumbar     on disability  . DDD (degenerative disc disease), cervical     s/p fusion  . Pancreatitis 04/24/2011  . Cutaneous skin tags 07/24/2013  . Pneumonia 07/24/2013  . Pain in joint, shoulder region 11/20/2013    Past Surgical History  Procedure Laterality Date  . Anterior fusion cervical spine  2008    C6-7  Dr Wynetta Emery  . Posterior fusion cervical spine  2011    C6-7  . Cholecystectomy N/A 02/27/2013    Procedure: LAPAROSCOPIC CHOLECYSTECTOMY WITH INTRAOPERATIVE CHOLANGIOGRAM;  Surgeon: Ardeth Sportsman, MD;  Location: [facility];  Service: General;  Laterality: N/A;    Family History  Problem Relation Age of Onset  . Hypertension Mother   . Hyperlipidemia Mother   . Other Father     DDD, colectomy,cardiac stent, afib  . Hypertension Brother   . Colon cancer Neg Hx   . Stomach cancer Neg Hx     History   Social History  . Marital Status: Married    Spouse Name: N/A    Number of Children: N/A  . Years of Education: N/A   Occupational History  . Not on file.   Social History Main Topics  . Smoking status: Never Smoker   . Smokeless tobacco: Never Used  . Alcohol Use: 3.0 oz/week    5 Cans of beer per week  . Drug Use:  No  . Sexual Activity: Not on file   Other Topics Concern  . Not on file   Social History Narrative   Reviewed history and no changes required.   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   Regular exercise-no   Smoking Status:  quit   Caffeine use/day:  none   Does Patient Exercise:  no          Current Outpatient Prescriptions on File Prior to Visit  Medication Sig Dispense Refill  . albuterol (PROVENTIL HFA;VENTOLIN HFA) 108 (90 BASE) MCG/ACT inhaler Inhale 2 puffs into the lungs every 6 (six) hours as needed for wheezing.  1 Inhaler  2  . cyclobenzaprine (FLEXERIL) 10 MG tablet Take 10 mg by mouth daily.      . meloxicam (MOBIC) 7.5 MG tablet Take 7.5 mg by mouth daily.      Marland Kitchen oxyCODONE-acetaminophen (PERCOCET/ROXICET) 5-325 MG per tablet Take 1 tablet by mouth every 4 (four) hours as needed for pain.      Marland Kitchen propranolol ER (INDERAL LA) 80 MG 24 hr capsule Take 80 mg by mouth as  needed.       . ramelteon (ROZEREM) 8 MG tablet Take 8 mg by mouth at bedtime.        Marland Kitchen tiZANidine (ZANAFLEX) 2 MG tablet Take 2 mg by mouth every 8 (eight) hours as needed.       No current facility-administered medications on file prior to visit.    Allergies  Allergen Reactions  . Amoxicillin-Pot Clavulanate     REACTION: Throat swelling.  . Azithromycin     REACTION: Throat Swelling  . Cefazolin     REACTION: Throat Swelling  . Moxifloxacin     REACTION: Throat Swelling  . Penicillins     REACTION: throat swelling  . Sulfonamide Derivatives     Review of Systems  Review of Systems  Constitutional: Negative for fever and malaise/fatigue.  HENT: Negative for congestion.   Eyes: Negative for discharge.  Respiratory: Negative for shortness of breath.   Cardiovascular: Negative for chest pain, palpitations and leg swelling.  Gastrointestinal: Negative for nausea, abdominal pain and  diarrhea.  Genitourinary: Negative for dysuria.  Musculoskeletal: Positive for back pain, joint pain, myalgias and neck pain. Negative for falls.  Skin: Negative for rash.  Neurological: Negative for loss of consciousness and headaches.  Endo/Heme/Allergies: Negative for polydipsia.  Psychiatric/Behavioral: Negative for depression and suicidal ideas. The patient is not nervous/anxious and does not have insomnia.     Objective  BP 102/82  Pulse 96  Temp(Src) 98 F (36.7 C) (Oral)  Ht 5' 7.5" (1.715 m)  Wt 201 lb 1.9 oz (91.227 kg)  BMI 31.02 kg/m2  SpO2 97%  LMP 09/23/2011  Physical Exam  Physical Exam  Constitutional: She is oriented to person, place, and time and well-developed, well-nourished, and in no distress. No distress.  HENT:  Head: Normocephalic and atraumatic.  Eyes: Conjunctivae are normal.  Neck: Neck supple. No thyromegaly present.  Cardiovascular: Normal rate, regular rhythm and normal heart sounds.   No murmur heard. Pulmonary/Chest: Effort normal and breath sounds normal. She has no wheezes.  Abdominal: She exhibits no distension and no mass.  Musculoskeletal: She exhibits no edema.  Lymphadenopathy:    She has no cervical adenopathy.  Neurological: She is alert and oriented to person, place, and time.  Skin: Skin is warm and dry. No rash noted. She is not diaphoretic.  Psychiatric: Memory, affect and judgment normal.    Lab Results  Component Value Date   TSH 2.347 07/18/2013   Lab Results  Component Value Date   WBC 6.0 11/11/2013   HGB 14.2 11/11/2013   HCT 40.7 11/11/2013   MCV 90.4 11/11/2013   PLT 309 11/11/2013   Lab Results  Component Value Date   CREATININE 0.71 11/11/2013   BUN 12 11/11/2013   NA 143 11/11/2013   K 5.1 11/11/2013   CL 104 11/11/2013   CO2 27 11/11/2013   Lab Results  Component Value Date   ALT 28 11/11/2013   AST 23 11/11/2013   ALKPHOS 86 11/11/2013   BILITOT 0.5 11/11/2013   Lab Results  Component Value  Date   CHOL 248* 11/11/2013   Lab Results  Component Value Date   HDL 61 11/11/2013   Lab Results  Component Value Date   LDLCALC 153* 11/11/2013   Lab Results  Component Value Date   TRIG 168* 11/11/2013   Lab Results  Component Value Date   CHOLHDL 4.1 11/11/2013     Assessment & Plan  Hypertension Well controlled, no changes  GERD (gastroesophageal  reflux disease) C/o some dysphagia as well and is offered referral but declines. Avoid offending foods, minimize NSAIDs  Other and unspecified hyperlipidemia Not taking her Red Yeast Rice regularly is going to start and encouraged avoid trans fats and take krill oil regularly.   Pain in joint, shoulder region Referred to sports med for further consideration may continue pain meds prn

## 2013-11-20 NOTE — Assessment & Plan Note (Signed)
Well controlled, no changes 

## 2013-12-05 ENCOUNTER — Ambulatory Visit: Payer: 59 | Admitting: Family Medicine

## 2013-12-09 ENCOUNTER — Ambulatory Visit (INDEPENDENT_AMBULATORY_CARE_PROVIDER_SITE_OTHER): Payer: 59 | Admitting: Family Medicine

## 2013-12-09 ENCOUNTER — Encounter: Payer: Self-pay | Admitting: Family Medicine

## 2013-12-09 ENCOUNTER — Other Ambulatory Visit (INDEPENDENT_AMBULATORY_CARE_PROVIDER_SITE_OTHER): Payer: 59

## 2013-12-09 VITALS — BP 114/82 | HR 97

## 2013-12-09 DIAGNOSIS — M25512 Pain in left shoulder: Secondary | ICD-10-CM

## 2013-12-09 DIAGNOSIS — M25519 Pain in unspecified shoulder: Secondary | ICD-10-CM

## 2013-12-09 DIAGNOSIS — M62838 Other muscle spasm: Secondary | ICD-10-CM | POA: Insufficient documentation

## 2013-12-09 MED ORDER — AMITRIPTYLINE HCL 25 MG PO TABS: 25.0000 mg | ORAL_TABLET | Freq: Every day | ORAL | Status: DC

## 2013-12-09 NOTE — Assessment & Plan Note (Signed)
I think patient's is likely having a spasm secondary to nerve root irritation. This is likely secondary to either the fusion or the amount of injury to the nerve prior to surgery. Patient will be started on amitriptyline as well as night. We will send this note to her pain management Dr. so we are in concert with each other. Patient given home exercise program and discuss other home remedies that could be beneficial. Patient will come back again in 3 weeks for further evaluation

## 2013-12-09 NOTE — Progress Notes (Signed)
I'm seeing this patient by the request  of: Kathy Huerta, Kathy Stanley, MD   CC: Left-sided neck/shoulder pain    HPI: Patient is a very pleasant 52 year old female coming in with left-sided neck/shoulder pain. Patient has had this pain for greater than 3 years. 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 Patient states at this time she continues to have this pain on the left side. Patient states it seems to fluctuate meaning that it gets bigger or smaller on different days and when he gets bigger it seems to be more painful. Patient has had a workup for this in the past including an MRI that did not show anything substantial she states. Patient states that this pain seems to be fairly localized is some mild radiation into her shoulder. Patient states that this does feel like I nerve pain that she had previously before the surgery but without the weakness. Patient states he can get better as long as she doesn't do too much activity but it does affect her regular activities of daily living it does wake her up at night. Patient has been living with this for some time and seeing a pain management. Nothing seems to be helping this type of pain. Patient rates the pain as 8/10   Past medical, surgical, family and social history reviewed. Medications reviewed all in the electronic medical record.   Review of Systems: No headache, visual changes, nausea, vomiting, diarrhea, constipation, dizziness, abdominal pain, skin rash, fevers, chills, night sweats, weight loss, swollen lymph nodes, body aches, joint swelling, muscle aches, chest pain, shortness of breath, mood changes.   Objective:    Blood pressure 114/82, pulse 97, last menstrual period 09/23/2011, SpO2 97.00%.   General: No apparent distress alert and oriented x3 mood and affect normal, dressed appropriately.  HEENT: Pupils equal, extraocular movements intact Respiratory: Patient's speak in full  sentences and does not appear short of breath Cardiovascular: No lower extremity edema, non tender, no erythema Skin: Warm dry intact with no signs of infection or rash on extremities or on axial skeleton. Abdomen: Soft nontender Neuro: Cranial nerves II through XII are intact, neurovascularly intact in all extremities with 2+ DTRs and 2+ pulses. Lymph: No lymphadenopathy of posterior or anterior cervical chain or axillae bilaterally.  Gait normal with good balance and coordination.  MSK: Non tender with full range of motion and good stability and symmetric strength and tone of shoulders, elbows, wrist, hip, knee and ankles bilaterally.  Patient's neck exam shows that she did have a fusion with incision well healed. Patient does have mild hump and tightness of the trapezius on the right side. There is a trigger point is very large and very very tender. This does not seem to be causing any radiation. Patient does have full range of motion of the shoulder and full-strength of the rotator cuff. Patient does have some limitation of her neck in flexion and extension secondary to effusion.  Musculoskeletal ultrasound was performed and interpreted by Antoine Primas, M today. Patient's left trapezius especially meds but does have what appears to be a spasm of the trapezius muscle overlying the supraspinatus. Even during this it is tender to the patient. Impression: No mass such as lipoma or cystic findings noted.  After verbal consent patient was prepped with alcohol swabs and with 26-gauge half-inch needle was injected with 2 cc of 0.5% Marcaine and 1 cc of 40 mg/dL into the trigger point. Patient had near complete  resolution of pain immediately.  Impression and Recommendations:     This case required medical decision making of moderate complexity.

## 2013-12-09 NOTE — Patient Instructions (Signed)
Very nice to meet you Try exercsies most days of the week.  Amitriptyline at night.

## 2013-12-09 NOTE — Progress Notes (Signed)
Pre-visit discussion using our clinic review tool. No additional management support is needed unless otherwise documented below in the visit note.  

## 2013-12-12 ENCOUNTER — Telehealth: Payer: Self-pay | Admitting: *Deleted

## 2013-12-12 NOTE — Telephone Encounter (Signed)
[facility] Triage Call Report Triage Record Num: [id number] Operator: Claudie Leach Patient Name: Kathy Huerta Call Date & Time: 12/09/2013 7:17:37PM Patient Phone: [phone] PCP: Terrilee Files Patient Gender: Female PCP Fax : [phone] Patient DOB: [birth year] Practice Name: [facility] Reason for Call: Does not have periods. Caller: Allie/Patient; PCP: Antoine Primas; CB#: [phone]; Call regarding headache after receiving Steroid injection on 12/09/13. States she started having a headache 30 minutes after leaving the office. States when she got home from the office she noticed she had printed information on patient Dory Horn. States she is concerned that that patient's information was sent to her pain Dr. Andrey Cota she called the office at 12:15 pm and left a message to call her back regarding this and states she did not get a call back. States her headache was a 5 and increased to a 9 and states she took a Percocet 5/325 and 1 Amitriptyline 25 mg at 6 pm and states her headache is a little better now. Ranks it now as a 3 on the pain scale. Is complaining of neck pain, but states her neck always hurts. Triaged per Headache guideline. To see provider within 24 hours due to headache associated with or made worse by coughing, straining at stool, exertion or sexual activity. Care advice given. Advised to be seen within 24 hours. Notified Dr. Beverely Low and she stated the patient's chart was pulled up during the visit and the correct patient info should have been sent to the pain Dr. and requests a note be sent to the office regarding this. Caller made aware. Note sent. Protocol(s) Used: Headache Recommended Outcome per Protocol: See Provider within 24 hours Reason for Outcome: Headache associated with or made worse by coughing, straining at stool, exertion or sexual activity Care Advice: ~ SYMPTOM / CONDITION MANAGEMENT ~ CAUTIONS ~ Call provider immediately for severe headache  that lasts more than 24 hours. ~ Avoid exertional activity while having a headache until evaluated. Call EMS 911 if having a sudden, severe disabling headache OR if the person spontaneously verbalized this headache is "the worst headache of my life." ~ Total water intake includes drinking water, water in beverages, and water contained in food. Fluids make up about 80% of the body's total hydration need. Individual fluid requirement to maintain hydration vary based on physical activity, environmental factors and illness. Limit fluids that contain sugar, caffeine, or alcohol. Urine will be very light yellow color when you drink enough fluids. ~ 12/09/2013 7:48:12PM Page 1 of 1 [id number]

## 2013-12-30 ENCOUNTER — Ambulatory Visit (INDEPENDENT_AMBULATORY_CARE_PROVIDER_SITE_OTHER): Payer: 59 | Admitting: Family Medicine

## 2013-12-30 ENCOUNTER — Encounter: Payer: Self-pay | Admitting: Family Medicine

## 2013-12-30 VITALS — BP 122/80 | HR 105

## 2013-12-30 DIAGNOSIS — M62838 Other muscle spasm: Secondary | ICD-10-CM

## 2013-12-30 MED ORDER — AMITRIPTYLINE HCL 25 MG PO TABS: 25.0000 mg | ORAL_TABLET | Freq: Every day | ORAL | Status: DC

## 2013-12-30 MED ORDER — DICLOFENAC SODIUM 1 % TD GEL: 2.0000 g | Freq: Two times a day (BID) | TRANSDERMAL | Status: DC

## 2013-12-30 NOTE — Assessment & Plan Note (Addendum)
Still likely secondary to muscle and nerve irritation.  Patient will start some more exercises and hopefully get him on the more religious basis. Patient will come back again in 3 weeks for further evaluation and treatment options. Patient has tried multiple topicals before but has not tried Voltaren. Warned of potential side effects. If patient continues to have pain we can consider a hydrodissection of the nerves going into the trapezius muscle.

## 2013-12-30 NOTE — Progress Notes (Signed)
    CC: Left-sided neck/shoulder pain  follow up   HPI: Patient is a very pleasant 53 year old female coming in with left-sided neck/shoulder pain. Patient has had this pain for greater than 3 years. 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 Patient didi have large muscle spasm a trigger point at last visit and was given a trigger point injection. Patient states that this did help for hours but unfortunately and then resolved. Patient states that she had significant pain the day after but then it started to improve somewhat. Patient states that she's approximately 15% better. Patient states though that she still has significant discomfort and now it is crossing midline somewhat. Patient has responded to anti-inflammatory somewhat. Patient denies any new symptoms such as radiation in the arms or any weakness. Patient states that the cold weather has made it somewhat worse.  Past medical, surgical, family and social history reviewed. Medications reviewed all in the electronic medical record.   Review of Systems: No headache, visual changes, nausea, vomiting, diarrhea, constipation, dizziness, abdominal pain, skin rash, fevers, chills, night sweats, weight loss, swollen lymph nodes, body aches, joint swelling, muscle aches, chest pain, shortness of breath, mood changes.   Objective:    Blood pressure 122/80, pulse 105, last menstrual period 09/23/2011, SpO2 96.00%.   General: No apparent distress alert and oriented x3 mood and affect normal, dressed appropriately.  HEENT: Pupils equal, extraocular movements intact Respiratory: Patient's speak in full sentences and does not appear short of breath Cardiovascular: No lower extremity edema, non tender, no erythema Skin: Warm dry intact with no signs of infection or rash on extremities or on axial skeleton. Abdomen: Soft nontender Neuro: Cranial nerves II through XII are intact, neurovascularly  intact in all extremities with 2+ DTRs and 2+ pulses. Lymph: No lymphadenopathy of posterior or anterior cervical chain or axillae bilaterally.  Gait normal with good balance and coordination.  MSK: Non tender with full range of motion and good stability and symmetric strength and tone of shoulders, elbows, wrist, hip, knee and ankles bilaterally.  Patient's neck exam shows that she did have a fusion with incision well healed. Patient does have mild hump and tightness of the trapezius on the left side and mild one on the right. Patient is severely tender to palpation even to light touch Patient does have full range of motion of the shoulder and full-strength of the rotator cuff. Patient does have some limitation of her neck in flexion and extension secondary to effusion.    Impression and Recommendations:     This case required medical decision making of moderate complexity.

## 2013-12-30 NOTE — Patient Instructions (Signed)
Good to see you Try the voltaren gel twice daily.  Try to do the standing on the wall start at about 1 minute daily.  Add 20 seconds every week.  Come back again in 3 weeks.

## 2013-12-30 NOTE — Progress Notes (Signed)
Pre-visit discussion using our clinic review tool. No additional management support is needed unless otherwise documented below in the visit note.  

## 2014-01-02 ENCOUNTER — Other Ambulatory Visit: Payer: Self-pay

## 2014-01-02 DIAGNOSIS — Z1231 Encounter for screening mammogram for malignant neoplasm of breast: Secondary | ICD-10-CM

## 2014-01-20 ENCOUNTER — Ambulatory Visit (INDEPENDENT_AMBULATORY_CARE_PROVIDER_SITE_OTHER): Payer: 59 | Admitting: Family Medicine

## 2014-01-20 ENCOUNTER — Other Ambulatory Visit: Payer: 59

## 2014-01-20 ENCOUNTER — Encounter: Payer: Self-pay | Admitting: Family Medicine

## 2014-01-20 VITALS — BP 118/76 | HR 110 | Temp 97.2°F | Resp 16 | Wt 198.2 lb

## 2014-01-20 DIAGNOSIS — M2170 Unequal limb length (acquired), unspecified site: Secondary | ICD-10-CM | POA: Insufficient documentation

## 2014-01-20 DIAGNOSIS — R5383 Other fatigue: Principal | ICD-10-CM

## 2014-01-20 DIAGNOSIS — K111 Hypertrophy of salivary gland: Secondary | ICD-10-CM

## 2014-01-20 DIAGNOSIS — R5381 Other malaise: Secondary | ICD-10-CM

## 2014-01-20 DIAGNOSIS — M999 Biomechanical lesion, unspecified: Secondary | ICD-10-CM | POA: Insufficient documentation

## 2014-01-20 DIAGNOSIS — M62838 Other muscle spasm: Secondary | ICD-10-CM

## 2014-01-20 DIAGNOSIS — Z0000 Encounter for general adult medical examination without abnormal findings: Secondary | ICD-10-CM

## 2014-01-20 LAB — RHEUMATOID FACTOR: Rhuematoid fact SerPl-aCnc: <10 IU/mL (ref <=14)

## 2014-01-20 NOTE — Progress Notes (Signed)
    CC: Left-sided neck/shoulder pain  follow up   HPI: Patient is a very pleasant 53 year old female coming in with left-sided neck/shoulder pain. Patient has had this pain for greater than 3 years. 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 Patient has a trigger point injection before and is doing [place] in general. Patient has been on amitriptyline at night. Patient states she has been doing very well overall. Patient states that she still has the same discomfort it seems to be only minimally better. Patient is having other back pains in the thoracic spine. We have discussed previously about osteopathic manipulation and patient would like to try this today.  Past medical, surgical, family and social history reviewed. Medications reviewed all in the electronic medical record.   Review of Systems: No headache, visual changes, nausea, vomiting, diarrhea, constipation, dizziness, abdominal pain, skin rash, fevers, chills, night sweats, weight loss, swollen lymph nodes, body aches, joint swelling, muscle aches, chest pain, shortness of breath, mood changes.   Objective:    Blood pressure 118/76, pulse 110, temperature 97.2 F (36.2 C), temperature source Oral, resp. rate 16, weight 198 lb 3.2 oz (89.903 kg), last menstrual period 09/23/2011, SpO2 90.00%.   General: No apparent distress alert and oriented x3 mood and affect normal, dressed appropriately.  HEENT: Pupils equal, extraocular movements intact patient though does have significant swelling of the parotid glands bilaterally. Respiratory: Patient's speak in full sentences and does not appear short of breath Cardiovascular: No lower extremity edema, non tender, no erythema Skin: Warm dry intact with no signs of infection or rash on extremities or on axial skeleton. Abdomen: Soft nontender Neuro: Cranial nerves II through XII are intact, neurovascularly intact in all extremities  with 2+ DTRs and 2+ pulses. Lymph: No lymphadenopathy of posterior or anterior cervical chain or axillae bilaterally.  Gait normal with good balance and coordination.  MSK: Non tender with full range of motion and good stability and symmetric strength and tone of shoulders, elbows, wrist, hip, knee and ankles bilaterally.  Patient's neck exam shows that she did have a fusion with incision well healed. Patient does have mild hump and tightness of the trapezius on the left side and mild one on the right. Patient is severely tender to palpation even to light touch Patient does have full range of motion of the shoulder and full-strength of the rotator cuff. Patient does have some limitation of her neck in flexion and extension secondary to effusion but has improved from previous visit.  Osteopathic findings Cervical: Deferred  Thoracic  T3 extended rotated and side bent left T7 and extended rotated and side bent left  Lumbar L2 flexed rotated inside that  Sacral Left on left  Patient does have a short leg discrepancy on the left side.    Impression and Recommendations:     This case required medical decision making of moderate complexity. Spent greater than 25 minutes with patient face-to-face and had greater than 50% of counseling including as described above in assessment and plan.

## 2014-01-20 NOTE — Progress Notes (Signed)
Pre-visit discussion using our clinic review tool. No additional management support is needed unless otherwise documented below in the visit note.  

## 2014-01-20 NOTE — Assessment & Plan Note (Signed)
Decision today to treat with OMT was based on Physical Exam  After verbal consent patient was treated with  ME, FPR techniques in  thoracic, lumbar and sacral areas  Patient tolerated the procedure well with improvement in symptoms  Patient given exercises, stretches and lifestyle modifications  See medications in patient instructions if given  Patient will follow up in 2 weeks 

## 2014-01-20 NOTE — Patient Instructions (Signed)
Good to see you Continue the pill at night.  Lets continue the posture exercises Heel lift left shoe.  Come back in 2 weeks for another manipulation.  Look up sogrens syndrome.

## 2014-01-20 NOTE — Addendum Note (Signed)
Addended by: Lyndal Pulley on: 01/20/2014 12:42 PM   Modules accepted: Orders

## 2014-01-20 NOTE — Assessment & Plan Note (Signed)
Will test for for Sjogren syndrome secondary to parotid gland enlargement bilaterally.

## 2014-01-20 NOTE — Assessment & Plan Note (Signed)
Patient is improving somewhat with Elavil. Patient did have some mild response with osteopathic manipulation. With patient having parotid gland swelling I will also get workup for Sjogren syndrome encases is more of a autoimmune dysfunction that is causing this to. I still think it's likely secondary to nerve irritation status post fusion.

## 2014-01-23 LAB — SJOGRENS SYNDROME-A EXTRACTABLE NUCLEAR ANTIBODY: SSA (RO) (ENA) ANTIBODY, IGG: NEGATIVE

## 2014-01-23 LAB — SJOGRENS SYNDROME-B EXTRACTABLE NUCLEAR ANTIBODY: SSB (La) (ENA) Antibody, IgG: <1

## 2014-01-23 LAB — ANA: ANA: NEGATIVE

## 2014-01-24 ENCOUNTER — Ambulatory Visit
Admission: RE | Admit: 2014-01-24 | Discharge: 2014-01-24 | Disposition: A | Discharge status: Home / Self Care | Payer: Self-pay | Source: Ambulatory Visit

## 2014-01-24 DIAGNOSIS — Z1231 Encounter for screening mammogram for malignant neoplasm of breast: Secondary | ICD-10-CM

## 2014-01-25 ENCOUNTER — Encounter: Payer: Self-pay | Admitting: Family Medicine

## 2014-01-25 ENCOUNTER — Other Ambulatory Visit: Payer: Self-pay | Admitting: Obstetrics and Gynecology

## 2014-01-25 DIAGNOSIS — R928 Other abnormal and inconclusive findings on diagnostic imaging of breast: Secondary | ICD-10-CM

## 2014-02-02 ENCOUNTER — Ambulatory Visit
Admission: RE | Admit: 2014-02-02 | Discharge: 2014-02-02 | Disposition: A | Discharge status: Home / Self Care | Payer: 59 | Source: Ambulatory Visit | Attending: Obstetrics and Gynecology | Admitting: Obstetrics and Gynecology

## 2014-02-02 ENCOUNTER — Ambulatory Visit
Admission: RE | Admit: 2014-02-02 | Discharge: 2014-02-02 | Disposition: A | Discharge status: Home / Self Care | Payer: Self-pay | Source: Ambulatory Visit | Attending: Obstetrics and Gynecology | Admitting: Obstetrics and Gynecology

## 2014-02-02 DIAGNOSIS — R928 Other abnormal and inconclusive findings on diagnostic imaging of breast: Secondary | ICD-10-CM

## 2014-02-03 ENCOUNTER — Other Ambulatory Visit (INDEPENDENT_AMBULATORY_CARE_PROVIDER_SITE_OTHER): Payer: 59

## 2014-02-03 ENCOUNTER — Ambulatory Visit (INDEPENDENT_AMBULATORY_CARE_PROVIDER_SITE_OTHER): Payer: 59 | Admitting: Family Medicine

## 2014-02-03 ENCOUNTER — Encounter: Payer: Self-pay | Admitting: Family Medicine

## 2014-02-03 VITALS — BP 132/74 | HR 103 | Temp 97.3°F | Resp 16 | Wt 198.0 lb

## 2014-02-03 DIAGNOSIS — M353 Polymyalgia rheumatica: Secondary | ICD-10-CM

## 2014-02-03 LAB — CBC WITH DIFFERENTIAL/PLATELET
BASOS PCT: 0.2 % (ref 0.0–3.0)
Basophils Absolute: 0 10*3/uL (ref 0.0–0.1)
EOS ABS: 0.1 10*3/uL (ref 0.0–0.7)
Eosinophils Relative: 0.9 % (ref 0.0–5.0)
HCT: 43.4 % (ref 36.0–46.0)
Hemoglobin: 14.3 g/dL (ref 12.0–15.0)
Lymphocytes Relative: 20.8 % (ref 12.0–46.0)
Lymphs Abs: 2 10*3/uL (ref 0.7–4.0)
MCHC: 32.9 g/dL (ref 30.0–36.0)
MCV: 92.7 fl (ref 78.0–100.0)
MONO ABS: 0.5 10*3/uL (ref 0.1–1.0)
Monocytes Relative: 5.1 % (ref 3.0–12.0)
Neutro Abs: 7.2 10*3/uL (ref 1.4–7.7)
Neutrophils Relative %: 73 % (ref 43.0–77.0)
Platelets: 387 10*3/uL (ref 150.0–400.0)
RBC: 4.69 Mil/uL (ref 3.87–5.11)
RDW: 13.4 % (ref 11.5–14.6)
WBC: 9.9 10*3/uL (ref 4.5–10.5)

## 2014-02-03 LAB — COMPREHENSIVE METABOLIC PANEL
ALK PHOS: 86 U/L (ref 39–117)
ALT: 26 U/L (ref 0–35)
AST: 18 U/L (ref 0–37)
Albumin: 4.1 g/dL (ref 3.5–5.2)
BILIRUBIN TOTAL: 0.5 mg/dL (ref 0.3–1.2)
BUN: 19 mg/dL (ref 6–23)
CO2: 25 mEq/L (ref 19–32)
Calcium: 9.6 mg/dL (ref 8.4–10.5)
Chloride: 103 mEq/L (ref 96–112)
Creatinine, Ser: 0.7 mg/dL (ref 0.4–1.2)
GFR: 91.22 mL/min (ref >60.00)
GLUCOSE: 118 mg/dL — AB (ref 70–99)
POTASSIUM: 3.3 meq/L — AB (ref 3.5–5.1)
SODIUM: 139 meq/L (ref 135–145)
Total Protein: 7.8 g/dL (ref 6.0–8.3)

## 2014-02-03 LAB — T3, FREE: T3, Free: 2.6 pg/mL (ref 2.3–4.2)

## 2014-02-03 LAB — T4, FREE: Free T4: 0.85 ng/dL (ref 0.60–1.60)

## 2014-02-03 LAB — TSH: TSH: 2.79 u[IU]/mL (ref 0.35–5.50)

## 2014-02-03 MED ORDER — PREDNISONE 10 MG PO TABS: ORAL_TABLET | ORAL | Status: DC

## 2014-02-03 NOTE — Patient Instructions (Signed)
We will get labs again today.  Prednisone as prescribed.  Come back again in 2 weeks and we will discuss.

## 2014-02-03 NOTE — Assessment & Plan Note (Addendum)
Patient has had significant discomfort with multiple muscle as well as joint complaints over the years. Patient does have signs and symptoms that could correspond with a home unit disease. So far patient's workup has been negative but differential also includes Ig4 disease. I did discuss with patient at great length we decided that we will do further workup with labral as well as treatment with prednisone. Patient will try a two-week taper and then come back for further evaluation. Pending lab results we will decide if we are able to treat conservatively or if we need to become more aggressive I would like referral to rheumatology. Patient's father has seen Dr. Caralee Ates previously and this would be aware refer to. Differential includes Mikulicz's disease ([id number] dacryoadenitis and sialadenitis) ?Sclerosing sialadenitis (Kttner's tumor, IgG4-related submandibular gland disease) ?Inflammatory orbital pseudotumor (IgG4-related orbital inflammation or orbital inflammatory pseudotumor) ?Chronic sclerosing dacryoadenitis (lacrimal gland enlargement, IgG4-related dacryoadenitis

## 2014-02-03 NOTE — Progress Notes (Signed)
Pre-visit discussion using our clinic review tool. No additional management support is needed unless otherwise documented below in the visit note.  

## 2014-02-03 NOTE — Progress Notes (Signed)
    CC: Left-sided neck/shoulder pain follow up and swollen glands.    HPI: Patient is a very pleasant 53 year old female coming in with left-sided neck/shoulder pain. Patient has had this pain for greater than 3 years. 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 Patient continues with her exercises with very minimal improvement. Patient did respond somewhat to osteopathic manipulation but is here further for evaluation of bilateral parotid enlargement. Patient has been told before that she does have hypertrophy of the muscles. In addition this patient has recently been diagnosed with a lymph node enlargement of the right breast. There was a concern patient was having sjogrens syndrome and labs were drawn. These were all fairly unremarkable. Unfortunately patient continues with the same multiple complaints including hair loss, dry skin, dry mouth denies, and multiple polymyalgias.  Past medical, surgical, family and social history reviewed. Medications reviewed all in the electronic medical record.   Review of Systems: No headache, visual changes, nausea, vomiting, diarrhea, constipation, dizziness, abdominal pain, skin rash, fevers, chills, night sweats, weight loss, swollen lymph nodes, body aches, joint swelling, muscle aches, chest pain, shortness of breath, mood changes.   Objective:    Blood pressure 132/74, pulse 103, temperature 97.3 F (36.3 C), temperature source Oral, resp. rate 16, weight 198 lb (89.812 kg), last menstrual period 09/23/2011, SpO2 98.00%.   General: No apparent distress alert and oriented x3 mood and affect normal, dressed appropriately.  HEENT: Pupils equal, extraocular movements intact patient though does have significant swelling of the parotid glands bilaterally. Respiratory: Patient's speak in full sentences and does not appear short of breath Cardiovascular: No lower extremity edema, non tender, no  erythema Skin: Warm dry intact with no signs of infection or rash on extremities or on axial skeleton. Abdomen: Soft nontender Neuro: Cranial nerves II through XII are intact, neurovascularly intact in all extremities with 2+ DTRs and 2+ pulses. Lymph: Positive area radicular lymph nodes are large bilaterally right greater than left. Patient also has significant enlargement of the parotid gland bilaterally. Gait normal with good balance and coordination.  MSK: Non tender with full range of motion and good stability and symmetric strength and tone of shoulders, elbows, wrist, hip, knee and ankles bilaterally.  Patient's neck exam shows that she did have a fusion with incision well healed. Patient does have mild hump and tightness of the trapezius on the left side and mild one on the right. Patient is severely tender to palpation even to light touch Patient does have full range of motion of the shoulder and full-strength of the rotator cuff. Patient does have some limitation of her neck in flexion and extension secondary to effusion but has improved from previous visit.  Limited the skin skeletal ultrasound over the parotid gland show that this is actually enlargement of gland material either lymph node versus parotid gland this is not secondary to muscle.    Impression and Recommendations:     This case required medical decision making of moderate complexity. Spent greater than 25 minutes with patient face-to-face and had greater than 50% of counseling including as described above in assessment and plan.

## 2014-02-04 LAB — CANCER ANTIGEN 19-9: CA 19-9: 13.7 U/mL (ref <35.0)

## 2014-02-06 LAB — PATHOLOGIST SMEAR REVIEW

## 2014-02-06 LAB — LAB10411

## 2014-02-07 LAB — H PYLORI, IGM, IGG, IGA AB
H Pylori IgG: <0.9 U/mL (ref 0.0–0.8)
H. pylori, IgA Abs: <9 units (ref 0.0–8.9)
H. pylori, IgM Abs: <9 units (ref 0.0–8.9)

## 2014-02-07 LAB — IGG 1, 2, 3, AND 4
IGG SUBCLASS 2: 302 mg/dL (ref 241–700)
IGG SUBCLASS 4: 10.1 mg/dL (ref 4.0–86.0)
IgG (Immunoglobin G), Serum: 1040 mg/dL (ref 690–1700)
IgG Subclass 1: 608 mg/dL (ref 382–929)
IgG Subclass 3: 21 mg/dL — ABNORMAL LOW (ref 22–178)

## 2014-02-08 ENCOUNTER — Encounter: Payer: Self-pay | Admitting: Family Medicine

## 2014-02-09 ENCOUNTER — Telehealth: Payer: Self-pay | Admitting: Family Medicine

## 2014-02-09 MED ORDER — LEVOTHYROXINE SODIUM 50 MCG PO TABS: 50.0000 ug | ORAL_TABLET | Freq: Every day | ORAL | Status: DC

## 2014-02-09 NOTE — Telephone Encounter (Signed)
Called patient to discuss lab results.  Will start synthroid.  Start  mvi

## 2014-02-17 ENCOUNTER — Encounter: Payer: Self-pay | Admitting: Family Medicine

## 2014-02-17 ENCOUNTER — Ambulatory Visit (INDEPENDENT_AMBULATORY_CARE_PROVIDER_SITE_OTHER): Payer: 59 | Admitting: Family Medicine

## 2014-02-17 VITALS — BP 126/70 | HR 74 | Temp 98.3°F | Resp 16 | Wt 201.0 lb

## 2014-02-17 DIAGNOSIS — M999 Biomechanical lesion, unspecified: Secondary | ICD-10-CM

## 2014-02-17 DIAGNOSIS — M353 Polymyalgia rheumatica: Secondary | ICD-10-CM

## 2014-02-17 DIAGNOSIS — K111 Hypertrophy of salivary gland: Secondary | ICD-10-CM

## 2014-02-17 MED ORDER — NORTRIPTYLINE HCL 25 MG PO CAPS: 25.0000 mg | ORAL_CAPSULE | Freq: Every day | ORAL | Status: DC

## 2014-02-17 NOTE — Progress Notes (Signed)
Pre visit review using our clinic review tool, if applicable. No additional management support is needed unless otherwise documented below in the visit note. 

## 2014-02-17 NOTE — Progress Notes (Signed)
CC: Left-sided neck/shoulder pain follow up and swollen glands.    HPI: Patient is a very pleasant 53 year old female coming in with left-sided neck/shoulder pain. Patient has had this pain for greater than 3 years. 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 Patient continues with her exercises with very minimal improvement. Patient did respond somewhat to osteopathic manipulation.  We have also done a significant workup for her polymyalgia. Patient was found to have a subacute thyroid problems and has started Synthroid recently. Patient did do a trial of prednisone which is does cause her to feel very anxious. Has not helped her with any of her symptoms. Patient continues to complain of severe dry mouth and eyes.   Past medical, surgical, family and social history reviewed. Medications reviewed all in the electronic medical record.   Review of Systems: No headache, visual changes, nausea, vomiting, diarrhea, constipation, dizziness, abdominal pain, skin rash, fevers, chills, night sweats, weight loss, swollen lymph nodes, body aches, joint swelling, muscle aches, chest pain, shortness of breath, mood changes.   Objective:    Blood pressure 126/70, pulse 74, temperature 98.3 F (36.8 C), temperature source Oral, resp. rate 16, weight 201 lb (91.173 kg), last menstrual period 09/23/2011, SpO2 97.00%.   General: No apparent distress alert and oriented x3 mood and affect normal, dressed appropriately.  HEENT: Pupils equal, extraocular movements intact patient though does have significant swelling of the parotid glands bilaterally. Respiratory: Patient's speak in full sentences and does not appear short of breath Cardiovascular: No lower extremity edema, non tender, no erythema Skin: Warm dry intact with no signs of infection or rash on extremities or on axial skeleton. Abdomen: Soft nontender Neuro: Cranial nerves II through XII are  intact, neurovascularly intact in all extremities with 2+ DTRs and 2+ pulses. Lymph: Positive area radicular lymph nodes are large bilaterally right greater than left. Patient also has significant enlargement of the parotid gland bilaterally. Gait normal with good balance and coordination.  MSK: Non tender with full range of motion and good stability and symmetric strength and tone of shoulders, elbows, wrist, hip, knee and ankles bilaterally.  Patient's neck exam shows that she did have a fusion with incision well healed. Patient does have mild hump and tightness of the trapezius on the left side and mild one on the right. Patient is severely tender to palpation even to light touch Patient does have full range of motion of the shoulder and full-strength of the rotator cuff. Patient does have some limitation of her neck in flexion and extension secondary to effusion but has improved from previous visit.  OMT finding  Thoracic T3 extended rotated and side bent  left T7 extended rotated and side bent right   Impression and Recommendations:     This case required medical decision making of moderate complexity. Spent greater than 25 minutes with patient face-to-face and had greater than 50% of counseling including as described above in assessment and plan.

## 2014-02-17 NOTE — Assessment & Plan Note (Signed)
Decision today to treat with OMT was based on Physical Exam  After verbal consent patient was treated with HVLA techniques in thoracic areas  Patient tolerated the procedure well with improvement in symptoms  Patient given exercises, stretches and lifestyle modifications  See medications in patient instructions if given  Patient will follow up in 3 weeks      

## 2014-02-17 NOTE — Assessment & Plan Note (Signed)
Patient sporadically enlargements as well as her other physical findings including polymyalgias, dry mouth and tries, and intermittent chronic infections I am still concerned for another autoimmune disease. We have done a fairly lengthy workup without any significant findings. I do think referral to rheumatology would be beneficial. Patient would like to avoid having further workup and we'll send over all information including labs.

## 2014-02-17 NOTE — Patient Instructions (Signed)
Good to see you as always Stop the prednisone Continue the synthroid Rheumatology will be calling you Stop the amitriptyline and start the nortriptyline.  email me in 10 days and tell me how you are doing.  Come back again in 3 weeks for more manipulation.

## 2014-02-28 ENCOUNTER — Encounter: Payer: Self-pay | Admitting: Family Medicine

## 2014-02-28 ENCOUNTER — Ambulatory Visit (INDEPENDENT_AMBULATORY_CARE_PROVIDER_SITE_OTHER): Payer: 59 | Admitting: Family Medicine

## 2014-02-28 VITALS — BP 122/82 | HR 93 | Temp 98.3°F | Ht 67.5 in | Wt 200.0 lb

## 2014-02-28 DIAGNOSIS — K5909 Other constipation: Secondary | ICD-10-CM

## 2014-02-28 DIAGNOSIS — I10 Essential (primary) hypertension: Secondary | ICD-10-CM

## 2014-02-28 DIAGNOSIS — E785 Hyperlipidemia, unspecified: Secondary | ICD-10-CM

## 2014-02-28 DIAGNOSIS — K5900 Constipation, unspecified: Secondary | ICD-10-CM

## 2014-02-28 NOTE — Progress Notes (Signed)
Pre visit review using our clinic review tool, if applicable. No additional management support is needed unless otherwise documented below in the visit note. 

## 2014-02-28 NOTE — Patient Instructions (Signed)
Sjgren's Syndrome Sjgren's syndrome is a disease in which the body's natural defense system (immune system) turns against the body's own cells and attacks the body's glands that produce tears and saliva. Sjgren's syndrome is sometimes linked to rheumatic disorders, such as rheumatoid arthritis and lupus. It is 10 times more common in women than in men. Most people with Sjgren's syndrome are from 52 to 54 years of age. CAUSES  The cause is unknown. It runs in families. It is possible that a trigger, such as a viral infection, can set it off. SYMPTOMS  The main symptoms are:  Dry mouth.  Chalky feeling, mouth feels like it is full of cotton.  Difficulty swallowing, speaking, or tasting.  Prone to cavities and mouth infections.  Dry eyes.  Burning, itching, feels like sand in the eyes.  Blurry vision.  Light sensitive. Other symptoms may include:  Skin, nose, and vaginal dryness.  Joint pain, stiffness, and muscle pain. Other organs that may be affected include:  Kidneys.  Blood vessels.  Lungs.  Liver.  Pancreas.  Brain. DIAGNOSIS  Diagnosis is first based on symptoms, medical history, and physical exam. Tests may also be done, including:  Tear production test (Schirmer's test).  A thorough eye exam using a magnifying device (slit-lamp exam).  Test to see the extent of eye damage using dye staining.  Mouth exam to look for signs of salivary gland swelling and mouth dryness.  Removal of a minor salivary gland from inside the lower lip to be studied under a microscope (lip biopsy).  Blood tests. Routine and special blood studies will be checked. Antibodies that attack normal tissue (autoantibodies) may be present, such as antinuclear antibodies (ANAs), rheumatoid factors, and Sjgren's antibodies (anti-SSA and anti-SSB).  Chest X-ray.  Urine tests (urinalysis). TREATMENT  There is no known cure for this syndrome. There is no specific treatment to restore  gland secretion, either. Treatment varies and is generally based upon the problems present.  Moisture replacement therapies may ease the symptoms of dryness.  Nonsteroidal anti-inflammatory drugs (NSAIDs), such as ibuprofen, may be used to treat musculoskeletal symptoms.  Corticosteroids, such as prednisone, may be given for people with severe complications.  Immunosuppressive drugs may be prescribed to control overactivity of the immune system. In severe cases, this overactivity can lead to organ damage.  A surgical procedure called punctal occlusion may be considered. This is done to close the tear ducts, helping to keep more natural tears on the eye's surface. A small percentage of people with Sjgren's syndrome develop lymphoma. If you are worried that you might develop lymphoma, talk to your caregiver to learn more about the disease and the symptoms to watch. HOME CARE INSTRUCTIONS   Eye care:  Use eyedrops as specified by your caregiver.  Try to blink 5 to 6 times per minute.  Protect your eyes from drafts and breezes.  Maintain properly humidified air.  Avoid smoke.  Mouth care:  Sugar-free gum and hard candy can help in some people.  Take frequent sips of water or sugar-free drinks.  Use lip balm, saliva substitutes, and prescription medicines as directed. SEEK MEDICAL CARE IF:   You have an oral temperature above 102 F (38.9 C).  You have night sweats.  You develop constant fatigue.  You have unexplained weight loss.  You develop itchy skin.  You have reddened patches on the skin. FOR MORE INFORMATION  Sjgren's Syndrome Foundation: www.sjogrens.[facility]: www.niams.SouthExposed.es Document Released:  11/28/2002 Document Revised: 03/01/2012 Document Reviewed: 04/15/2010 [facility] Patient Information 2014 [place]. Hypothyroidism The thyroid is a large gland located in the lower front of your  neck. The thyroid gland helps control metabolism. Metabolism is how your body handles food. It controls metabolism with the hormone thyroxine. When this gland is underactive (hypothyroid), it produces too little hormone.  CAUSES These include:   Absence or destruction of thyroid tissue.  Goiter due to iodine deficiency.  Goiter due to medications.  Congenital defects (since birth).  Problems with the pituitary. This causes a lack of TSH (thyroid stimulating hormone). This hormone tells the thyroid to turn out more hormone. SYMPTOMS  Lethargy (feeling as though you have no energy)  Cold intolerance  Weight gain (in spite of normal food intake)  Dry skin  Coarse hair  Menstrual irregularity (if severe, may lead to infertility)  Slowing of thought processes Cardiac problems are also caused by insufficient amounts of thyroid hormone. Hypothyroidism in the newborn is cretinism, and is an extreme form. It is important that this form be treated adequately and immediately or it will lead rapidly to retarded physical and mental development. DIAGNOSIS  To prove hypothyroidism, your caregiver may do blood tests and ultrasound tests. Sometimes the signs are hidden. It may be necessary for your caregiver to watch this illness with blood tests either before or after diagnosis and treatment. TREATMENT  Low levels of thyroid hormone are increased by using synthetic thyroid hormone. This is a safe, effective treatment. It usually takes about four weeks to gain the full effects of the medication. After you have the full effect of the medication, it will generally take another four weeks for problems to leave. Your caregiver may start you on low doses. If you have had heart problems the dose may be gradually increased. It is generally not an emergency to get rapidly to normal. HOME CARE INSTRUCTIONS   Take your medications as your caregiver suggests. Let your caregiver know of any medications you are  taking or start taking. Your caregiver will help you with dosage schedules.  As your condition improves, your dosage needs may increase. It will be necessary to have continuing blood tests as suggested by your caregiver.  Report all suspected medication side effects to your caregiver. SEEK MEDICAL CARE IF: Seek medical care if you develop:  Sweating.  Tremulousness (tremors).  Anxiety.  Rapid weight loss.  Heat intolerance.  Emotional swings.  Diarrhea.  Weakness. SEEK IMMEDIATE MEDICAL CARE IF:  You develop chest pain, an irregular heart beat (palpitations), or a rapid heart beat. MAKE SURE YOU:   Understand these instructions.  Will watch your condition.  Will get help right away if you are not doing well or get worse. Document Released: 12/08/2005 Document Revised: 03/01/2012 Document Reviewed: 07/28/2008 [facility] Patient Information 2014 [place].

## 2014-03-01 ENCOUNTER — Encounter: Payer: Self-pay | Admitting: Family Medicine

## 2014-03-05 NOTE — Assessment & Plan Note (Signed)
Encouraged heart healthy diet, increase exercise, avoid trans fats, consider a krill oil cap daily 

## 2014-03-05 NOTE — Assessment & Plan Note (Signed)
Well controlled, no changes to meds. Encouraged heart healthy diet such as the DASH diet and exercise as tolerated.  °

## 2014-03-05 NOTE — Progress Notes (Signed)
Patient ID: Kathy Huerta, female   DOB: 16-Aug-1961, 53 y.o.   MRN: [id number] Kathy Huerta [id number] 07/24/1961 03/05/2014      Progress Note-Follow Up  Subjective  Chief Complaint  Chief Complaint  Patient presents with  . Follow-up    3 month    HPI  Patient is a 53 year old female in today for routine medical care. No recent illness. Is frustrated with change in bowels alternates from constipation and diarrhea. No bloody or tarry stool. No abdominal pain or fever. Struggling with Dysphagia but no choking. Denies CP/palp/SOB/HA/congestion/fevers/GI or GU c/o. Taking meds as prescribed  Past Medical History  Diagnosis Date  . HTN (hypertension)   . Depression   . Heartburn   . Basal cell carcinoma 11/17/11    Dr. Sammuel Hines  . Headache(784.0) 01/29/2013  . DDD (degenerative disc disease), thoracolumbar     on disability  . DDD (degenerative disc disease), cervical     s/p fusion  . Pancreatitis 04/24/2011  . Cutaneous skin tags 07/24/2013  . Pneumonia 07/24/2013  . Pain in joint, shoulder region 11/20/2013    Past Surgical History  Procedure Laterality Date  . Anterior fusion cervical spine  2008    C6-7  Dr Saintclair Halsted  . Posterior fusion cervical spine  2011    C6-7  . Cholecystectomy N/A 02/27/2013    Procedure: LAPAROSCOPIC CHOLECYSTECTOMY WITH INTRAOPERATIVE CHOLANGIOGRAM;  Surgeon: Adin Hector, MD;  Location: [facility];  Service: General;  Laterality: N/A;    Family History  Problem Relation Age of Onset  . Hypertension Mother   . Hyperlipidemia Mother   . Other Father     DDD, colectomy,cardiac stent, afib  . Hypertension Brother   . Colon cancer Neg Hx   . Stomach cancer Neg Hx     History   Social History  . Marital Status: Married    Spouse Name: N/A    Number of Children: N/A  . Years of Education: N/A   Occupational History  . Not on file.   Social History Main Topics  . Smoking status: Never Smoker   . Smokeless tobacco: Never Used  . Alcohol Use: 3.0  oz/week    5 Cans of beer per week  . Drug Use: No  . Sexual Activity: Not on file   Other Topics Concern  . Not on file   Social History Narrative   Reviewed history and no changes required.   Occupation: Disabled,  has worked in the past for a [employer] (medical))   Married   Alcohol use-yes- every weekend 4-5 beers.   Regular exercise-no   Smoking Status:  quit   Caffeine use/day:  none   Does Patient Exercise:  no          Current Outpatient Prescriptions on File Prior to Visit  Medication Sig Dispense Refill  . albuterol (PROVENTIL HFA;VENTOLIN HFA) 108 (90 BASE) MCG/ACT inhaler Inhale 2 puffs into the lungs every 6 (six) hours as needed for wheezing.  1 Inhaler  2  . cyclobenzaprine (FLEXERIL) 10 MG tablet Take 10 mg by mouth daily.      . diclofenac sodium (VOLTAREN) 1 % GEL Apply 2 g topically 2 (two) times daily. To affected joint.  100 g  11  . levothyroxine (SYNTHROID, LEVOTHROID) 50 MCG tablet Take 1 tablet (50 mcg total) by mouth daily.  30 tablet  3  . meloxicam (MOBIC) 7.5 MG tablet Take 7.5 mg by mouth daily.      Marland Kitchen  nortriptyline (PAMELOR) 25 MG capsule Take 1 capsule (25 mg total) by mouth at bedtime.  30 capsule  3  . olmesartan (BENICAR) 20 MG tablet Take 0.5 tablets (10 mg total) by mouth daily.  45 tablet  3  . oxyCODONE-acetaminophen (PERCOCET/ROXICET) 5-325 MG per tablet Take 1 tablet by mouth every 4 (four) hours as needed for pain.      Marland Kitchen propranolol ER (INDERAL LA) 80 MG 24 hr capsule Take 80 mg by mouth as needed.       . ramelteon (ROZEREM) 8 MG tablet Take 8 mg by mouth at bedtime.        . sertraline (ZOLOFT) 100 MG tablet Take 1 tablet (100 mg total) by mouth daily.  90 tablet  2  . tiZANidine (ZANAFLEX) 2 MG tablet Take 2 mg by mouth every 8 (eight) hours as needed.      . triamterene-hydrochlorothiazide (MAXZIDE-25) 37.5-25 MG per tablet Take 0.5 tablets by mouth daily.  45 tablet  4   No current facility-administered medications on  file prior to visit.    Allergies  Allergen Reactions  . Amoxicillin-Pot Clavulanate     REACTION: Throat swelling.  . Azithromycin     REACTION: Throat Swelling  . Cefazolin     REACTION: Throat Swelling  . Moxifloxacin     REACTION: Throat Swelling  . Penicillins     REACTION: throat swelling  . Sulfonamide Derivatives     Review of Systems  Review of Systems  Constitutional: Positive for malaise/fatigue. Negative for fever.  HENT: Negative for congestion.   Eyes: Negative for discharge.  Respiratory: Negative for shortness of breath.   Cardiovascular: Negative for chest pain, palpitations and leg swelling.  Gastrointestinal: Positive for abdominal pain and diarrhea. Negative for heartburn, nausea, constipation and blood in stool.  Genitourinary: Negative for dysuria.  Musculoskeletal: Positive for back pain and myalgias. Negative for falls.  Skin: Negative for rash.  Neurological: Negative for loss of consciousness and headaches.  Endo/Heme/Allergies: Negative for polydipsia.  Psychiatric/Behavioral: Negative for depression and suicidal ideas. The patient is not nervous/anxious and does not have insomnia.     Objective  BP 122/82  Pulse 93  Temp(Src) 98.3 F (36.8 C) (Oral)  Ht 5' 7.5" (1.715 m)  Wt 200 lb (90.719 kg)  BMI 30.84 kg/m2  SpO2 97%  LMP 09/23/2011  Physical Exam  Physical Exam  Constitutional: She is oriented to person, place, and time and well-developed, well-nourished, and in no distress. No distress.  HENT:  Head: Normocephalic and atraumatic.  Eyes: Conjunctivae are normal.  Neck: Neck supple. No thyromegaly present.  Cardiovascular: Normal rate, regular rhythm and normal heart sounds.   No murmur heard. Pulmonary/Chest: Effort normal and breath sounds normal. She has no wheezes.  Abdominal: She exhibits no distension and no mass.  Musculoskeletal: She exhibits no edema.  Lymphadenopathy:    She has no cervical adenopathy.   Neurological: She is alert and oriented to person, place, and time.  Skin: Skin is warm and dry. No rash noted. She is not diaphoretic.  Psychiatric: Memory, affect and judgment normal.    Lab Results  Component Value Date   TSH 2.79 02/03/2014   Lab Results  Component Value Date   WBC 9.9 02/03/2014   HGB 14.3 02/03/2014   HCT 43.4 02/03/2014   MCV 92.7 02/03/2014   PLT 387.0 02/03/2014   Lab Results  Component Value Date   CREATININE 0.7 02/03/2014   BUN 19 02/03/2014   NA  139 02/03/2014   K 3.3* 02/03/2014   CL 103 02/03/2014   CO2 25 02/03/2014   Lab Results  Component Value Date   ALT 26 02/03/2014   AST 18 02/03/2014   ALKPHOS 86 02/03/2014   BILITOT 0.5 02/03/2014   Lab Results  Component Value Date   CHOL 248* 11/11/2013   Lab Results  Component Value Date   HDL 61 11/11/2013   Lab Results  Component Value Date   LDLCALC 153* 11/11/2013   Lab Results  Component Value Date   TRIG 168* 11/11/2013   Lab Results  Component Value Date   CHOLHDL 4.1 11/11/2013     Assessment & Plan  Hypertension Well controlled, no changes to meds. Encouraged heart healthy diet such as the DASH diet and exercise as tolerated.   Other and unspecified hyperlipidemia Encouraged heart healthy diet, increase exercise, avoid trans fats, consider a krill oil cap daily  Constipation, chronic Encouraged increased hydration and fiber in diet. Daily probiotics. If bowels not moving can use MOM 2 tbls po in 4 oz of warm prune juice by mouth every 2-3 days. If no results then repeat in 4 hours with  Dulcolax suppository pr, may repeat again in 4 more hours as needed. Seek care if symptoms worsen. Consider daily Miralax and/or Dulcolax if symptoms persist. Notes some dysphagia as well and intermittent diarrhea. May need consultation with gastroenterology

## 2014-03-05 NOTE — Assessment & Plan Note (Signed)
Encouraged increased hydration and fiber in diet. Daily probiotics. If bowels not moving can use MOM 2 tbls po in 4 oz of warm prune juice by mouth every 2-3 days. If no results then repeat in 4 hours with  Dulcolax suppository pr, may repeat again in 4 more hours as needed. Seek care if symptoms worsen. Consider daily Miralax and/or Dulcolax if symptoms persist. Notes some dysphagia as well and intermittent diarrhea. May need consultation with gastroenterology

## 2014-03-07 ENCOUNTER — Encounter: Payer: Self-pay | Admitting: Family Medicine

## 2014-03-10 ENCOUNTER — Ambulatory Visit: Payer: 59 | Admitting: Family Medicine

## 2014-03-17 ENCOUNTER — Ambulatory Visit: Payer: 59 | Admitting: Family Medicine

## 2014-03-20 ENCOUNTER — Other Ambulatory Visit (HOSPITAL_COMMUNITY)
Admission: RE | Admit: 2014-03-20 | Discharge: 2014-03-20 | Disposition: A | Discharge status: Home / Self Care | Payer: 59 | Source: Ambulatory Visit | Attending: Obstetrics and Gynecology | Admitting: Obstetrics and Gynecology

## 2014-03-20 ENCOUNTER — Other Ambulatory Visit: Payer: Self-pay | Admitting: Obstetrics and Gynecology

## 2014-03-20 DIAGNOSIS — Z01419 Encounter for gynecological examination (general) (routine) without abnormal findings: Secondary | ICD-10-CM | POA: Insufficient documentation

## 2014-05-10 ENCOUNTER — Other Ambulatory Visit: Payer: Self-pay | Admitting: Family Medicine

## 2014-05-22 ENCOUNTER — Other Ambulatory Visit: Payer: Self-pay | Admitting: Family Medicine

## 2014-06-08 ENCOUNTER — Other Ambulatory Visit: Payer: Self-pay | Admitting: Family Medicine

## 2014-06-13 ENCOUNTER — Other Ambulatory Visit: Payer: Self-pay | Admitting: Family Medicine

## 2014-06-15 ENCOUNTER — Other Ambulatory Visit: Payer: Self-pay | Admitting: Family Medicine

## 2014-06-15 NOTE — Telephone Encounter (Signed)
Refill done.  

## 2014-06-16 ENCOUNTER — Telehealth: Payer: Self-pay | Admitting: Family Medicine

## 2014-06-16 ENCOUNTER — Encounter: Payer: Self-pay | Admitting: Family Medicine

## 2014-06-16 DIAGNOSIS — I10 Essential (primary) hypertension: Secondary | ICD-10-CM

## 2014-06-16 DIAGNOSIS — E785 Hyperlipidemia, unspecified: Secondary | ICD-10-CM

## 2014-06-16 NOTE — Telephone Encounter (Signed)
Patient is requesting lab orders to be entered for next week. Patient wants cholesterol and thyroid labs. Per patient, ok to leave a detailed message.

## 2014-06-16 NOTE — Telephone Encounter (Signed)
OK to order renal, cbc, tsh, hepatic, lipid for hi chol and HTN

## 2014-06-16 NOTE — Telephone Encounter (Signed)
Please call and inform pt that lab order is placed

## 2014-06-16 NOTE — Telephone Encounter (Signed)
Informed patient of this.  °

## 2014-06-27 ENCOUNTER — Ambulatory Visit: Payer: 59 | Admitting: Family Medicine

## 2014-07-19 ENCOUNTER — Encounter: Payer: Self-pay | Admitting: Family Medicine

## 2014-08-02 LAB — RENAL FUNCTION PANEL
Albumin: 4.6 g/dL (ref 3.5–5.2)
BUN: 10 mg/dL (ref 6–23)
CHLORIDE: 102 meq/L (ref 96–112)
CO2: 27 mEq/L (ref 19–32)
Calcium: 9.8 mg/dL (ref 8.4–10.5)
Creat: 0.68 mg/dL (ref 0.50–1.10)
Glucose, Bld: 98 mg/dL (ref 70–99)
PHOSPHORUS: 3.4 mg/dL (ref 2.3–4.6)
POTASSIUM: 3.9 meq/L (ref 3.5–5.3)
SODIUM: 140 meq/L (ref 135–145)

## 2014-08-02 LAB — CBC
HCT: 43 % (ref 36.0–46.0)
HEMOGLOBIN: 14.8 g/dL (ref 12.0–15.0)
MCH: 30.1 pg (ref 26.0–34.0)
MCHC: 34.4 g/dL (ref 30.0–36.0)
MCV: 87.6 fL (ref 78.0–100.0)
Platelets: 324 10*3/uL (ref 150–400)
RBC: 4.91 MIL/uL (ref 3.87–5.11)
RDW: 13.6 % (ref 11.5–15.5)
WBC: 7.1 10*3/uL (ref 4.0–10.5)

## 2014-08-02 LAB — HEPATIC FUNCTION PANEL
ALBUMIN: 4.6 g/dL (ref 3.5–5.2)
ALK PHOS: 76 U/L (ref 39–117)
ALT: 27 U/L (ref 0–35)
AST: 24 U/L (ref 0–37)
BILIRUBIN INDIRECT: 0.4 mg/dL (ref 0.2–1.2)
BILIRUBIN TOTAL: 0.5 mg/dL (ref 0.2–1.2)
Bilirubin, Direct: 0.1 mg/dL (ref 0.0–0.3)
Total Protein: 7.8 g/dL (ref 6.0–8.3)

## 2014-08-02 LAB — LIPID PANEL
CHOL/HDL RATIO: 3.8 ratio
Cholesterol: 259 mg/dL — ABNORMAL HIGH (ref 0–200)
HDL: 68 mg/dL (ref >39)
LDL CALC: 140 mg/dL — AB (ref 0–99)
Triglycerides: 254 mg/dL — ABNORMAL HIGH (ref <150)
VLDL: 51 mg/dL — AB (ref 0–40)

## 2014-08-02 LAB — TSH: TSH: 2.833 u[IU]/mL (ref 0.350–4.500)

## 2014-08-03 ENCOUNTER — Encounter: Payer: Self-pay | Admitting: Internal Medicine

## 2014-08-07 ENCOUNTER — Encounter: Payer: Self-pay | Admitting: Family Medicine

## 2014-08-07 ENCOUNTER — Ambulatory Visit (INDEPENDENT_AMBULATORY_CARE_PROVIDER_SITE_OTHER): Payer: 59 | Admitting: Family Medicine

## 2014-08-07 VITALS — BP 112/88 | HR 92 | Temp 98.3°F | Ht 67.5 in | Wt 199.1 lb

## 2014-08-07 DIAGNOSIS — I10 Essential (primary) hypertension: Secondary | ICD-10-CM

## 2014-08-07 DIAGNOSIS — F3289 Other specified depressive episodes: Secondary | ICD-10-CM

## 2014-08-07 DIAGNOSIS — K219 Gastro-esophageal reflux disease without esophagitis: Secondary | ICD-10-CM

## 2014-08-07 DIAGNOSIS — E785 Hyperlipidemia, unspecified: Secondary | ICD-10-CM

## 2014-08-07 DIAGNOSIS — R000 Tachycardia, unspecified: Secondary | ICD-10-CM

## 2014-08-07 DIAGNOSIS — F32A Depression, unspecified: Secondary | ICD-10-CM

## 2014-08-07 DIAGNOSIS — K5900 Constipation, unspecified: Secondary | ICD-10-CM

## 2014-08-07 DIAGNOSIS — K5909 Other constipation: Secondary | ICD-10-CM

## 2014-08-07 DIAGNOSIS — F329 Major depressive disorder, single episode, unspecified: Secondary | ICD-10-CM

## 2014-08-07 MED ORDER — OLMESARTAN MEDOXOMIL 20 MG PO TABS: 10.0000 mg | ORAL_TABLET | Freq: Every day | ORAL | Status: DC

## 2014-08-07 MED ORDER — ATORVASTATIN CALCIUM 10 MG PO TABS: 10.0000 mg | ORAL_TABLET | Freq: Every day | ORAL | Status: DC

## 2014-08-07 MED ORDER — SERTRALINE HCL 100 MG PO TABS: 100.0000 mg | ORAL_TABLET | Freq: Every day | ORAL | Status: DC

## 2014-08-07 MED ORDER — TRIAMTERENE-HCTZ 37.5-25 MG PO TABS: 0.5000 | ORAL_TABLET | Freq: Every day | ORAL | Status: DC

## 2014-08-07 NOTE — Progress Notes (Signed)
Pre visit review using our clinic review tool, if applicable. No additional management support is needed unless otherwise documented below in the visit note. 

## 2014-08-07 NOTE — Patient Instructions (Addendum)
Consider CoQ 10 enzyme daily  Cholesterol Cholesterol is a white, waxy, fat-like substance needed by your body in small amounts. The liver makes all the cholesterol you need. Cholesterol is carried from the liver by the blood through the blood vessels. Deposits of cholesterol (plaque) may build up on blood vessel walls. These make the arteries narrower and stiffer. Cholesterol plaques increase the risk for heart attack and stroke.  You cannot feel your cholesterol level even if it is very high. The only way to know it is high is with a blood test. Once you know your cholesterol levels, you should keep a record of the test results. Work with your health care provider to keep your levels in the desired range.  WHAT DO THE RESULTS MEAN?  Total cholesterol is a rough measure of all the cholesterol in your blood.   LDL is the so-called bad cholesterol. This is the type that deposits cholesterol in the walls of the arteries. You want this level to be low.   HDL is the good cholesterol because it cleans the arteries and carries the LDL away. You want this level to be high.  Triglycerides are fat that the body can either burn for energy or store. High levels are closely linked to heart disease.  WHAT ARE THE DESIRED LEVELS OF CHOLESTEROL?  Total cholesterol below 200.   LDL below 100 for people at risk, below 70 for those at very high risk.   HDL above 50 is good, above 60 is best.   Triglycerides below 150.  HOW CAN I LOWER MY CHOLESTEROL?  Diet. Follow your diet programs as directed by your health care provider.   Choose fish or white meat chicken and [place], roasted or baked. Limit fatty cuts of red meat, fried foods, and processed meats, such as sausage and lunch meats.   Eat lots of fresh fruits and vegetables.  Choose whole grains, beans, pasta, potatoes, and cereals.   Use only small amounts of olive, corn, or canola oils.   Avoid butter, mayonnaise, shortening, or palm  kernel oils.  Avoid foods with trans fats.   Drink skim or nonfat milk and eat low-fat or nonfat yogurt and cheeses. Avoid whole milk, cream, ice cream, egg yolks, and full-fat cheeses.   Healthy desserts include angel food cake, ginger snaps, animal crackers, hard candy, popsicles, and low-fat or nonfat frozen yogurt. Avoid pastries, cakes, pies, and cookies.   Exercise. Follow your exercise programs as directed by your health care provider.   A regular program helps decrease LDL and raise HDL.   A regular program helps with weight control.   Do things that increase your activity level like gardening, walking, or taking the stairs. Ask your health care provider about how you can be more active in your daily life.   Medicine. Take medicine only as directed by your health care provider.   Medicine may be prescribed by your health care provider to help lower cholesterol and decrease the risk for heart disease.   If you have several risk factors, you may need medicine even if your levels are normal. Document Released: 09/02/2001 Document Revised: 04/24/2014 Document Reviewed: 09/21/2013 [facility] Patient Information 2015 [place], [place]. This information is not intended to replace advice given to you by your health care provider. Make sure you discuss any questions you have with your health care provider.

## 2014-08-07 NOTE — Assessment & Plan Note (Signed)
Well controlled, no changes to meds. Encouraged heart healthy diet such as the DASH diet and exercise as tolerated.  °

## 2014-08-08 ENCOUNTER — Telehealth: Payer: Self-pay | Admitting: Family Medicine

## 2014-08-08 NOTE — Telephone Encounter (Signed)
Relevant patient education assigned to patient using Emmi. ° °

## 2014-08-09 ENCOUNTER — Other Ambulatory Visit: Payer: Self-pay | Admitting: Obstetrics and Gynecology

## 2014-08-09 DIAGNOSIS — N63 Unspecified lump in unspecified breast: Secondary | ICD-10-CM

## 2014-08-12 ENCOUNTER — Encounter: Payer: Self-pay | Admitting: Family Medicine

## 2014-08-12 DIAGNOSIS — F32A Depression, unspecified: Secondary | ICD-10-CM | POA: Insufficient documentation

## 2014-08-12 DIAGNOSIS — R000 Tachycardia, unspecified: Secondary | ICD-10-CM

## 2014-08-12 DIAGNOSIS — F329 Major depressive disorder, single episode, unspecified: Secondary | ICD-10-CM | POA: Insufficient documentation

## 2014-08-12 HISTORY — DX: Tachycardia, unspecified: R00.0

## 2014-08-12 NOTE — Progress Notes (Signed)
Patient ID: Kathy Huerta, female   DOB: August 23, 1961, 53 y.o.   MRN: [id number] Kathy Huerta [id number] 28-Oct-1961 08/12/2014      Progress Note-Follow Up  Subjective  Chief Complaint  Chief Complaint  Patient presents with  . Follow-up    HPI  Patient is a 53 year old female in today for routine medical care. No recent illness. Has been under a great deal of stress but no suicidal ideation. Denies CP/palp/SOB/HA/congestion/fevers/GI or GU c/o. Taking meds as prescribed  Past Medical History  Diagnosis Date  . HTN (hypertension)   . Depression   . Heartburn   . Basal cell carcinoma 11/17/11    Dr. Sammuel Hines  . Headache(784.0) 01/29/2013  . DDD (degenerative disc disease), thoracolumbar     on disability  . DDD (degenerative disc disease), cervical     s/p fusion  . Pancreatitis 04/24/2011  . Cutaneous skin tags 07/24/2013  . Pneumonia 07/24/2013  . Pain in joint, shoulder region 11/20/2013  . Tachycardia 08/12/2014    Past Surgical History  Procedure Laterality Date  . Anterior fusion cervical spine  2008    C6-7  Dr Saintclair Halsted  . Posterior fusion cervical spine  2011    C6-7  . Cholecystectomy N/A 02/27/2013    Procedure: LAPAROSCOPIC CHOLECYSTECTOMY WITH INTRAOPERATIVE CHOLANGIOGRAM;  Surgeon: Adin Hector, MD;  Location: [facility];  Service: General;  Laterality: N/A;    Family History  Problem Relation Age of Onset  . Hypertension Mother   . Hyperlipidemia Mother   . Other Father     DDD, colectomy,cardiac stent, afib  . Hypertension Brother   . Colon cancer Neg Hx   . Stomach cancer Neg Hx     History   Social History  . Marital Status: Married    Spouse Name: N/A    Number of Children: N/A  . Years of Education: N/A   Occupational History  . Not on file.   Social History Main Topics  . Smoking status: Never Smoker   . Smokeless tobacco: Never Used  . Alcohol Use: 3.0 oz/week    5 Cans of beer per week  . Drug Use: No  . Sexual Activity: Not on file   Other  Topics Concern  . Not on file   Social History Narrative   Reviewed history and no changes required.   Occupation: Disabled,  has worked in the past for a [employer] (medical))   Married   Alcohol use-yes- every weekend 4-5 beers.   Regular exercise-no   Smoking Status:  quit   Caffeine use/day:  none   Does Patient Exercise:  no          Current Outpatient Prescriptions on File Prior to Visit  Medication Sig Dispense Refill  . albuterol (PROVENTIL HFA;VENTOLIN HFA) 108 (90 BASE) MCG/ACT inhaler Inhale 2 puffs into the lungs every 6 (six) hours as needed for wheezing.  1 Inhaler  2  . cyclobenzaprine (FLEXERIL) 10 MG tablet Take 10 mg by mouth daily.      . diclofenac sodium (VOLTAREN) 1 % GEL Apply 2 g topically 2 (two) times daily. To affected joint.  100 g  11  . meloxicam (MOBIC) 7.5 MG tablet Take 7.5 mg by mouth daily.      Marland Kitchen oxyCODONE-acetaminophen (PERCOCET/ROXICET) 5-325 MG per tablet Take 1 tablet by mouth every 4 (four) hours as needed for pain.      Marland Kitchen propranolol ER (INDERAL LA) 80 MG 24 hr capsule  Take 80 mg by mouth as needed.       . ramelteon (ROZEREM) 8 MG tablet Take 8 mg by mouth at bedtime.        Marland Kitchen tiZANidine (ZANAFLEX) 2 MG tablet Take 2 mg by mouth every 8 (eight) hours as needed.      . nortriptyline (PAMELOR) 25 MG capsule Take 1 capsule (25 mg total) by mouth at bedtime.  30 capsule  3   No current facility-administered medications on file prior to visit.    Allergies  Allergen Reactions  . Amoxicillin-Pot Clavulanate     REACTION: Throat swelling.  . Azithromycin     REACTION: Throat Swelling  . Cefazolin     REACTION: Throat Swelling  . Moxifloxacin     REACTION: Throat Swelling  . Penicillins     REACTION: throat swelling  . Sulfonamide Derivatives     Review of Systems  Review of Systems  Constitutional: Negative for fever and malaise/fatigue.  HENT: Negative for congestion.   Eyes: Negative for discharge.  Respiratory:  Negative for shortness of breath.   Cardiovascular: Negative for chest pain, palpitations and leg swelling.  Gastrointestinal: Negative for nausea, abdominal pain and diarrhea.  Genitourinary: Negative for dysuria.  Musculoskeletal: Negative for falls.  Skin: Negative for rash.  Neurological: Negative for loss of consciousness and headaches.  Endo/Heme/Allergies: Negative for polydipsia.  Psychiatric/Behavioral: Negative for depression and suicidal ideas. The patient is not nervous/anxious and does not have insomnia.     Objective  BP 112/88  Pulse 92  Temp(Src) 98.3 F (36.8 C) (Oral)  Ht 5' 7.5" (1.715 m)  Wt 199 lb 1.9 oz (90.32 kg)  BMI 30.71 kg/m2  SpO2 97%  LMP 09/23/2011  Physical Exam  Physical Exam  Constitutional: She is oriented to person, place, and time and well-developed, well-nourished, and in no distress. No distress.  HENT:  Head: Normocephalic and atraumatic.  Eyes: Conjunctivae are normal.  Neck: Neck supple. No thyromegaly present.  Cardiovascular: Normal rate, regular rhythm and normal heart sounds.   No murmur heard. Pulmonary/Chest: Effort normal and breath sounds normal. She has no wheezes.  Abdominal: She exhibits no distension and no mass.  Musculoskeletal: She exhibits no edema.  Lymphadenopathy:    She has no cervical adenopathy.  Neurological: She is alert and oriented to person, place, and time.  Skin: Skin is warm and dry. No rash noted. She is not diaphoretic.  Psychiatric: Memory, affect and judgment normal.    Lab Results  Component Value Date   TSH 2.833 08/02/2014   Lab Results  Component Value Date   WBC 7.1 08/02/2014   HGB 14.8 08/02/2014   HCT 43.0 08/02/2014   MCV 87.6 08/02/2014   PLT 324 08/02/2014   Lab Results  Component Value Date   CREATININE 0.68 08/02/2014   BUN 10 08/02/2014   NA 140 08/02/2014   K 3.9 08/02/2014   CL 102 08/02/2014   CO2 27 08/02/2014   Lab Results  Component Value Date   ALT 27 08/02/2014   AST  24 08/02/2014   ALKPHOS 76 08/02/2014   BILITOT 0.5 08/02/2014   Lab Results  Component Value Date   CHOL 259* 08/02/2014   Lab Results  Component Value Date   HDL 68 08/02/2014   Lab Results  Component Value Date   LDLCALC 140* 08/02/2014   Lab Results  Component Value Date   TRIG 254* 08/02/2014   Lab Results  Component Value Date   CHOLHDL  3.8 08/02/2014     Assessment & Plan  Hypertension Well controlled, no changes to meds. Encouraged heart healthy diet such as the DASH diet and exercise as tolerated.   Tachycardia Improved on recheck. Minimize caffeine and report if becomes symptomatic  GERD (gastroesophageal reflux disease) Avoid offending foods, start probiotics. Do not eat large meals in late evening and consider raising head of bed.   Constipation, chronic Encouraged increased hydration and fiber in diet. Daily probiotics.   Other and unspecified hyperlipidemia Tolerating statin, encouraged heart healthy diet, avoid trans fats, minimize simple carbs and saturated fats. Increase exercise as tolerated  Depression Encouraged counseling, regular exercise and Sertraline 100 mg daily

## 2014-08-12 NOTE — Assessment & Plan Note (Signed)
Avoid offending foods, start probiotics. Do not eat large meals in late evening and consider raising head of bed.  

## 2014-08-12 NOTE — Assessment & Plan Note (Signed)
Improved on recheck. Minimize caffeine and report if becomes symptomatic

## 2014-08-12 NOTE — Assessment & Plan Note (Signed)
Encouraged counseling, regular exercise and Sertraline 100 mg daily

## 2014-08-12 NOTE — Assessment & Plan Note (Addendum)
Encouraged increased hydration and fiber in diet. Daily probiotics.  

## 2014-08-12 NOTE — Assessment & Plan Note (Signed)
Tolerating statin, encouraged heart healthy diet, avoid trans fats, minimize simple carbs and saturated fats. Increase exercise as tolerated 

## 2014-08-22 DIAGNOSIS — E785 Hyperlipidemia, unspecified: Secondary | ICD-10-CM

## 2014-08-22 HISTORY — DX: Hyperlipidemia, unspecified: E78.5

## 2014-08-23 ENCOUNTER — Ambulatory Visit
Admission: RE | Admit: 2014-08-23 | Discharge: 2014-08-23 | Disposition: A | Discharge status: Home / Self Care | Payer: 59 | Source: Ambulatory Visit | Attending: Obstetrics and Gynecology | Admitting: Obstetrics and Gynecology

## 2014-08-23 DIAGNOSIS — N63 Unspecified lump in unspecified breast: Secondary | ICD-10-CM

## 2014-08-24 ENCOUNTER — Other Ambulatory Visit: Payer: Self-pay | Admitting: Family Medicine

## 2014-09-07 ENCOUNTER — Telehealth: Payer: Self-pay

## 2014-09-07 MED ORDER — PROPRANOLOL HCL ER 80 MG PO CP24: 80.0000 mg | ORAL_CAPSULE | ORAL | Status: DC | PRN

## 2014-09-07 NOTE — Telephone Encounter (Signed)
RX sent

## 2014-09-07 NOTE — Telephone Encounter (Signed)
Kathy Huerta [phone] Kathy Huerta [facility]  Kathy Huerta called and wanted Dr Charlett Blake to know she had tried some propranolol ER (INDERAL LA) 80 MG 24 hr capsule that she had from another doctor and it work quickly. She has been taking for the last 3 days and she would like for [place] to call in a prescription for her.

## 2014-09-07 NOTE — Telephone Encounter (Signed)
Please advise 

## 2014-09-07 NOTE — Telephone Encounter (Signed)
OK to send in Propranolol XR 80 mg po daily disp #30 with 3 rf

## 2014-10-02 ENCOUNTER — Ambulatory Visit (INDEPENDENT_AMBULATORY_CARE_PROVIDER_SITE_OTHER): Payer: 59

## 2014-10-02 DIAGNOSIS — Z23 Encounter for immunization: Secondary | ICD-10-CM

## 2014-10-06 ENCOUNTER — Ambulatory Visit (INDEPENDENT_AMBULATORY_CARE_PROVIDER_SITE_OTHER): Payer: 59 | Admitting: Internal Medicine

## 2014-10-06 ENCOUNTER — Encounter: Payer: Self-pay | Admitting: Internal Medicine

## 2014-10-06 VITALS — BP 126/84 | HR 76 | Ht 66.5 in | Wt 200.5 lb

## 2014-10-06 DIAGNOSIS — R1310 Dysphagia, unspecified: Secondary | ICD-10-CM

## 2014-10-06 DIAGNOSIS — K219 Gastro-esophageal reflux disease without esophagitis: Secondary | ICD-10-CM

## 2014-10-06 DIAGNOSIS — K635 Polyp of colon: Secondary | ICD-10-CM

## 2014-10-06 DIAGNOSIS — M3500 Sicca syndrome, unspecified: Secondary | ICD-10-CM

## 2014-10-06 DIAGNOSIS — R682 Dry mouth, unspecified: Secondary | ICD-10-CM

## 2014-10-06 MED ORDER — PANTOPRAZOLE SODIUM 40 MG PO TBEC: 40.0000 mg | DELAYED_RELEASE_TABLET | Freq: Every day | ORAL | Status: DC

## 2014-10-06 NOTE — Patient Instructions (Signed)
You have been scheduled for a Barium Esophogram at [facility] (1st floor of the hospital) on 10/12/2014 at 9:30am. Please arrive 15 minutes prior to your appointment for registration. Make certain not to have anything to eat or drink 6 hours prior to your test. If you need to reschedule for any reason, please contact radiology at [phone] to do so. __________________________________________________________________ A barium swallow is an examination that concentrates on views of the esophagus. This tends to be a double contrast exam (barium and two liquids which, when combined, create a gas to distend the wall of the oesophagus) or single contrast (non-ionic iodine based). The study is usually tailored to your symptoms so a good history is essential. Attention is paid during the study to the form, structure and configuration of the esophagus, looking for functional disorders (such as aspiration, dysphagia, achalasia, motility and reflux) EXAMINATION You may be asked to change into a gown, depending on the type of swallow being performed. A radiologist and radiographer will perform the procedure. The radiologist will advise you of the type of contrast selected for your procedure and direct you during the exam. You will be asked to stand, sit or lie in several different positions and to hold a small amount of fluid in your mouth before being asked to swallow while the imaging is performed .In some instances you may be asked to swallow barium coated marshmallows to assess the motility of a solid food bolus. The exam can be recorded as a digital or video fluoroscopy procedure. POST PROCEDURE It will take 1-2 days for the barium to pass through your system. To facilitate this, it is important, unless otherwise directed, to increase your fluids for the next 24-48hrs and to resume your normal diet.  This test typically takes about 30 minutes to perform.    You have been scheduled for an  endoscopy. Please follow written instructions given to you at your visit today. If you use inhalers (even only as needed), please bring them with you on the day of your procedure. Your physician has requested that you go to www.startemmi.com and enter the access code given to you at your visit today. This web site gives a general overview about your procedure. However, you should still follow specific instructions given to you by our office regarding your preparation for the procedure.  We have sent in your prescriptions to your pharmacy Use Zantac 150mg  OTC for breakthrough GERD __________________________________________________________________________________

## 2014-10-06 NOTE — Progress Notes (Signed)
Patient ID: Kathy Huerta, female   DOB: 26-Jun-1961, 53 y.o.   MRN: [id number] HPI: Kathy Huerta is a 53 year old female with a past medical history of Sjogren's, hypertension, degenerative disc disease, hyperlipidemia, anxiety, who is known to me from screening colonoscopy in 2013 who is seen at the request of Dr. Amil Amen to evaluate GERD symptoms. She is here alone today. She had screening colonoscopy performed on 05/21/2012 which revealed 6 diminutive polyps removed from the transverse, descending colon and rectum. Pathology was consistent with hyperplastic polyps. For this reason repeat screening was recommended in 10 years. She reports her biggest issue is with Sjogren's syndrome and associated extreme dry mouth. She is managed currently with pilocarpine which she is using to help aid in swallowing. She also reports significant heartburn over the last 10 years. She attributes worsening heartburn to weight gain. It has been a daily problem for her for quite some time and she was using Zantac 150 mg 3 times daily. She is recently increased pilocarpine and noticed improvement in heartburn but not resolution. Dry foods such as peanut butter and bread are very difficult to swallow but she attributes this more to decreased saliva then to esophageal dysphagia. Her heartburn at times can be intense and wake her from sleep. She is not currently taking antacid therapy. She has never taken PPI that she can recall. No weight loss. No nausea or vomiting. No regular NSAID use though she does use meloxicam 3 or 4 times per month for osteoarthritis and disc disease symptoms. She has never had an upper endoscopy. She denies chest pain and dyspnea. No hepatobiliary complaints. Bowel movements have been regular without blood in her stool or melena.  Past Medical History  Diagnosis Date  . HTN (hypertension) 2002  . Depression   . Heartburn   . Basal cell carcinoma 11/17/11    Dr. Sammuel Hines  . Headache(784.0) 01/29/2013  . DDD  (degenerative disc disease), thoracolumbar     on disability  . DDD (degenerative disc disease), cervical     s/p fusion  . Pancreatitis 04/24/2011  . Cutaneous skin tags 07/24/2013  . Pneumonia 07/24/2013  . Pain in joint, shoulder region 11/20/2013  . Tachycardia 08/12/2014  . Obesity   . Anxiety   . Colon polyp   . Hyperlipemia 08/2014    Past Surgical History  Procedure Laterality Date  . Anterior fusion cervical spine  2008    C6-7  Dr Saintclair Halsted  . Posterior fusion cervical spine  2011    C6-7  . Cholecystectomy N/A 02/27/2013    Procedure: LAPAROSCOPIC CHOLECYSTECTOMY WITH INTRAOPERATIVE CHOLANGIOGRAM;  Surgeon: Adin Hector, MD;  Location: [facility];  Service: General;  Laterality: N/A;    Outpatient Prescriptions Prior to Visit  Medication Sig Dispense Refill  . atorvastatin (LIPITOR) 10 MG tablet Take 1 tablet (10 mg total) by mouth daily.  30 tablet  3  . cyclobenzaprine (FLEXERIL) 10 MG tablet Take 10 mg by mouth daily.      . diclofenac sodium (VOLTAREN) 1 % GEL Apply 2 g topically 2 (two) times daily. To affected joint.  100 g  11  . meloxicam (MOBIC) 7.5 MG tablet Take 7.5 mg by mouth as needed.       Marland Kitchen olmesartan (BENICAR) 20 MG tablet Take 0.5 tablets (10 mg total) by mouth daily.  45 tablet  3  . oxyCODONE-acetaminophen (PERCOCET/ROXICET) 5-325 MG per tablet Take 1 tablet by mouth every 4 (four) hours as needed for pain.      Marland Kitchen  propranolol (INNOPRAN XL) 80 MG 24 hr capsule Take 80 mg by mouth at bedtime.      . propranolol ER (INDERAL LA) 80 MG 24 hr capsule Take 1 capsule (80 mg total) by mouth as needed.  30 capsule  3  . sertraline (ZOLOFT) 100 MG tablet TAKE 1 TABLET (100 MG TOTAL) BY MOUTH DAILY.  90 tablet  1  . tiZANidine (ZANAFLEX) 2 MG tablet Take 2 mg by mouth every 8 (eight) hours as needed.      . triamterene-hydrochlorothiazide (MAXZIDE-25) 37.5-25 MG per tablet Take 0.5 tablets by mouth daily.  45 tablet  3  . albuterol (PROVENTIL HFA;VENTOLIN HFA) 108 (90 BASE)  MCG/ACT inhaler Inhale 2 puffs into the lungs every 6 (six) hours as needed for wheezing.  1 Inhaler  2  . nortriptyline (PAMELOR) 25 MG capsule Take 1 capsule (25 mg total) by mouth at bedtime.  30 capsule  3  . ramelteon (ROZEREM) 8 MG tablet Take 8 mg by mouth at bedtime.         No facility-administered medications prior to visit.    Allergies  Allergen Reactions  . Amoxicillin-Pot Clavulanate     REACTION: Throat swelling.  . Azithromycin     REACTION: Throat Swelling  . Cefazolin     REACTION: Throat Swelling  . Moxifloxacin     REACTION: Throat Swelling  . Penicillins     REACTION: throat swelling  . Sulfonamide Derivatives     Family History  Problem Relation Age of Onset  . Hypertension Mother   . Hyperlipidemia Mother   . Other Father     DDD, colectomy,cardiac stent, afib  . Hypertension Brother   . Colon cancer Neg Hx   . Stomach cancer Neg Hx   . Colon polyps Father   . Diabetes Maternal Grandmother   . Kidney disease Neg Hx     History  Substance Use Topics  . Smoking status: Never Smoker   . Smokeless tobacco: Never Used  . Alcohol Use: 3.0 oz/week    5 Cans of beer per week    ROS: As per history of present illness, otherwise negative  BP 126/84  Pulse 76  Ht 5' 6.5" (1.689 m)  Wt 200 lb 8 oz (90.946 kg)  BMI 31.88 kg/m2  LMP 09/23/2011 Constitutional: Well-developed and well-nourished. No distress. HEENT: Normocephalic and atraumatic. Oropharynx is clear and dry. No oropharyngeal exudate. Conjunctivae are normal.  No scleral icterus. Bilateral parotid gland hypertrophy Neck: Neck supple. Trachea midline. Cardiovascular: Normal rate, regular rhythm and intact distal pulses. No M/R/G Pulmonary/chest: Effort normal and breath sounds normal. No wheezing, rales or rhonchi. Abdominal: Soft, mild tenderness at the epigastrium without rebound or guarding, nondistended. Bowel sounds active throughout. There are no masses palpable. No  hepatosplenomegaly. Extremities: no clubbing, cyanosis, or edema Neurological: Alert and oriented to person place and time. Skin: Skin is warm and dry. No rashes noted. Psychiatric: Normal mood and affect. Behavior is normal.  RELEVANT LABS AND IMAGING: CBC    Component Value Date/Time   WBC 7.1 08/02/2014 1105   RBC 4.91 08/02/2014 1105   HGB 14.8 08/02/2014 1105   HCT 43.0 08/02/2014 1105   PLT 324 08/02/2014 1105   MCV 87.6 08/02/2014 1105   MCH 30.1 08/02/2014 1105   MCHC 34.4 08/02/2014 1105   RDW 13.6 08/02/2014 1105   LYMPHSABS 2.0 02/03/2014 1442   MONOABS 0.5 02/03/2014 1442   EOSABS 0.1 02/03/2014 1442   BASOSABS 0.0 02/03/2014 1442  CMP     Component Value Date/Time   NA 140 08/02/2014 1105   K 3.9 08/02/2014 1105   CL 102 08/02/2014 1105   CO2 27 08/02/2014 1105   GLUCOSE 98 08/02/2014 1105   BUN 10 08/02/2014 1105   CREATININE 0.68 08/02/2014 1105   CREATININE 0.7 02/03/2014 1442   CALCIUM 9.8 08/02/2014 1105   PROT 7.8 08/02/2014 1105   ALBUMIN 4.6 08/02/2014 1105   ALBUMIN 4.6 08/02/2014 1105   AST 24 08/02/2014 1105   ALT 27 08/02/2014 1105   ALKPHOS 76 08/02/2014 1105   BILITOT 0.5 08/02/2014 1105   GFRNONAA >90 02/28/2013 0413   GFRNONAA >60 04/24/2011 0902   GFRAA >90 02/28/2013 0413   GFRAA >60 04/24/2011 0902    ASSESSMENT/PLAN: 53 year old female with a past medical history of Sjogren's, hypertension, degenerative disc disease, hyperlipidemia, anxiety, who is known to me from screening colonoscopy in 2013 who is seen at the request of Dr. Amil Amen to evaluate GERD symptoms.  1. GERD/question of dysphagia in the setting of Sjogren's -- she clearly has GERD symptomatology and this is long-standing. Esophagitis and mucosal edema could be contributing to some of her dysphagia symptoms, but dry mouth related to Sjogren's syndrome is also contributing. She has benefited from increased dose of pilocarpine. I have recommended initiation of PPI therapy for better reflux control.  Pantoprazole 40 mg, 30 minutes before breakfast each day. I also would like to perform a barium swallow with tablet to evaluate motility and overall esophageal function. Following the barium swallow, an upper endoscopy to evaluate for esophagitis and exclude Barrett's esophagus. She will continue current dose of pilocarpine to aid in swallowing and saliva production. Standard reflux precautions and weight reduction would likely help reflux disease.  2. History of hyperplastic colon polyps/CRC screening -- up to date and given no history of adenoma, would repeat colonoscopy in 2023

## 2014-10-12 ENCOUNTER — Ambulatory Visit (HOSPITAL_COMMUNITY)
Admission: RE | Admit: 2014-10-12 | Discharge: 2014-10-12 | Disposition: A | Discharge status: Home / Self Care | Payer: 59 | Source: Ambulatory Visit | Attending: Internal Medicine | Admitting: Internal Medicine

## 2014-10-12 DIAGNOSIS — K449 Diaphragmatic hernia without obstruction or gangrene: Secondary | ICD-10-CM | POA: Insufficient documentation

## 2014-10-12 DIAGNOSIS — R1310 Dysphagia, unspecified: Secondary | ICD-10-CM

## 2014-10-12 DIAGNOSIS — R12 Heartburn: Secondary | ICD-10-CM | POA: Diagnosis present

## 2014-10-12 DIAGNOSIS — K219 Gastro-esophageal reflux disease without esophagitis: Secondary | ICD-10-CM | POA: Diagnosis not present

## 2014-10-13 ENCOUNTER — Encounter: Payer: Self-pay | Admitting: Internal Medicine

## 2014-11-06 ENCOUNTER — Other Ambulatory Visit: Payer: Self-pay | Admitting: Family Medicine

## 2014-11-13 ENCOUNTER — Telehealth: Payer: Self-pay | Admitting: Family Medicine

## 2014-11-13 DIAGNOSIS — E785 Hyperlipidemia, unspecified: Secondary | ICD-10-CM

## 2014-11-13 DIAGNOSIS — I10 Essential (primary) hypertension: Secondary | ICD-10-CM

## 2014-11-13 NOTE — Telephone Encounter (Signed)
Please order lipid for hyperlipidemia, cbc, liver, renal, tsh for HTN thanks

## 2014-11-13 NOTE — Telephone Encounter (Signed)
Caller name: Riata, Ikeda Relation to pt: self  Call back number: [phone]   Reason for call:  Pt scheduled lab appointment for 11/15/14 states Dr. Charlett Blake would like pt to have blood work done before follow up. Please advise

## 2014-11-13 NOTE — Telephone Encounter (Signed)
Please advise pt that labs have been ordered.

## 2014-11-14 ENCOUNTER — Ambulatory Visit (AMBULATORY_SURGERY_CENTER): Payer: 59 | Admitting: Internal Medicine

## 2014-11-14 ENCOUNTER — Encounter: Payer: Self-pay | Admitting: Internal Medicine

## 2014-11-14 VITALS — BP 139/67 | HR 72 | Temp 97.1°F | Resp 12 | Ht 66.5 in | Wt 200.0 lb

## 2014-11-14 DIAGNOSIS — K219 Gastro-esophageal reflux disease without esophagitis: Secondary | ICD-10-CM

## 2014-11-14 DIAGNOSIS — R1310 Dysphagia, unspecified: Secondary | ICD-10-CM

## 2014-11-14 MED ORDER — SODIUM CHLORIDE 0.9 % IV SOLN: 500.0000 mL | INTRAVENOUS | Status: DC

## 2014-11-14 NOTE — Telephone Encounter (Signed)
Pt advised.

## 2014-11-14 NOTE — Progress Notes (Signed)
Patient awakening,vss,report to rn 

## 2014-11-14 NOTE — Op Note (Signed)
[place]  [address]. [place], [id number]   ENDOSCOPY PROCEDURE REPORT  PATIENT: Kathy, Huerta  MR#: [id number] BIRTHDATE: [birth year] , 88  yrs. old GENDER: female ENDOSCOPIST: Jerene Bears, MD PROCEDURE DATE:  11/14/2014 PROCEDURE:  EGD, diagnostic ASA CLASS:     Class III INDICATIONS:  dysphagia and heartburn. MEDICATIONS: Monitored anesthesia care and Propofol 250 mg IV TOPICAL ANESTHETIC: none  DESCRIPTION OF PROCEDURE: After the risks benefits and alternatives of the procedure were thoroughly explained, informed consent was obtained.  The LB GIF-H180 Loaner [id number] endoscope was introduced through the mouth and advanced to the second portion of the duodenum , Without limitations.  The instrument was slowly withdrawn as the mucosa was fully examined.    ESOPHAGUS: The mucosa of the esophagus appeared normal.  Hiatal hernia, 3 cm.  Beginning 37 cm from the esophagus and extending to diaphragmatic hiatus at 40 cm.  Probable nonobstructing Schatzki's ring  STOMACH: The mucosa of the stomach appeared normal.  DUODENUM: The duodenal mucosa showed no abnormalities in the bulb and 2nd part of the duodenum.  Retroflexed views revealed a hiatal hernia.     The scope was then withdrawn from the patient and the procedure completed.  COMPLICATIONS: There were no immediate complications.  ENDOSCOPIC IMPRESSION: 1.   The mucosa of the esophagus appeared normal 2.   Hiatal hernia, 3 cm.  Beginning 37 cm from the esophagus and extending to diaphragmatic hiatus at 40 cm 3.   The mucosa of the stomach appeared normal 4.   The duodenal mucosa showed no abnormalities in the bulb and 2nd part of the duodenum  RECOMMENDATIONS: 1.  Continue taking your PPI (antiacid medicine) once daily.  It is best to be taken 20-30 minutes prior to breakfast meal. 2.  Office follow-up as needed  eSigned:  Jerene Bears, MD 11/14/2014 4:10 PM     CC:The Patient, Kathy Alma,  MD, and Kathy Aurora MD

## 2014-11-14 NOTE — Patient Instructions (Signed)

## 2014-11-14 NOTE — Progress Notes (Signed)
No problems noted in the recovery room. maw 

## 2014-11-15 ENCOUNTER — Other Ambulatory Visit (INDEPENDENT_AMBULATORY_CARE_PROVIDER_SITE_OTHER): Payer: 59

## 2014-11-15 ENCOUNTER — Telehealth: Payer: Self-pay | Admitting: *Deleted

## 2014-11-15 DIAGNOSIS — E785 Hyperlipidemia, unspecified: Secondary | ICD-10-CM

## 2014-11-15 DIAGNOSIS — I10 Essential (primary) hypertension: Secondary | ICD-10-CM

## 2014-11-15 LAB — HEPATIC FUNCTION PANEL
ALK PHOS: 75 U/L (ref 39–117)
ALT: 26 U/L (ref 0–35)
AST: 23 U/L (ref 0–37)
Albumin: 3.9 g/dL (ref 3.5–5.2)
Bilirubin, Direct: 0 mg/dL (ref 0.0–0.3)
TOTAL PROTEIN: 7.2 g/dL (ref 6.0–8.3)
Total Bilirubin: 0.4 mg/dL (ref 0.2–1.2)

## 2014-11-15 LAB — RENAL FUNCTION PANEL
ALBUMIN: 3.9 g/dL (ref 3.5–5.2)
BUN: 9 mg/dL (ref 6–23)
CHLORIDE: 105 meq/L (ref 96–112)
CO2: 26 meq/L (ref 19–32)
Calcium: 9 mg/dL (ref 8.4–10.5)
Creatinine, Ser: 0.7 mg/dL (ref 0.4–1.2)
GFR: 95.6 mL/min (ref >60.00)
Glucose, Bld: 84 mg/dL (ref 70–99)
PHOSPHORUS: 3.2 mg/dL (ref 2.3–4.6)
Potassium: 4.3 mEq/L (ref 3.5–5.1)
Sodium: 140 mEq/L (ref 135–145)

## 2014-11-15 LAB — LIPID PANEL
Cholesterol: 175 mg/dL (ref 0–200)
HDL: 54.2 mg/dL (ref >39.00)
LDL Cholesterol: 93 mg/dL (ref 0–99)
NonHDL: 120.8
TRIGLYCERIDES: 138 mg/dL (ref 0.0–149.0)
Total CHOL/HDL Ratio: 3
VLDL: 27.6 mg/dL (ref 0.0–40.0)

## 2014-11-15 LAB — TSH: TSH: 3.38 u[IU]/mL (ref 0.35–4.50)

## 2014-11-15 LAB — CBC
HCT: 38.7 % (ref 36.0–46.0)
Hemoglobin: 13 g/dL (ref 12.0–15.0)
MCHC: 33.7 g/dL (ref 30.0–36.0)
MCV: 90.8 fl (ref 78.0–100.0)
Platelets: 308 10*3/uL (ref 150.0–400.0)
RBC: 4.26 Mil/uL (ref 3.87–5.11)
RDW: 13.5 % (ref 11.5–15.5)
WBC: 7.5 10*3/uL (ref 4.0–10.5)

## 2014-11-15 NOTE — Telephone Encounter (Signed)
  Follow up Call-  Call back number 11/14/2014 05/21/2012  Post procedure Call Back phone  # [phone]  Permission to leave phone message Yes Yes     Patient questions:  Do you have a fever, pain , or abdominal swelling? No. Pain Score  0 *  Have you tolerated food without any problems? Yes.    Have you been able to return to your normal activities? Yes.    Do you have any questions about your discharge instructions: Diet   No. Medications  No. Follow up visit  No.  Do you have questions or concerns about your Care? No.  Actions: * If pain score is 4 or above: No action needed, pain <4.

## 2014-11-21 ENCOUNTER — Ambulatory Visit (INDEPENDENT_AMBULATORY_CARE_PROVIDER_SITE_OTHER): Payer: 59 | Admitting: Family Medicine

## 2014-11-21 ENCOUNTER — Encounter: Payer: Self-pay | Admitting: Family Medicine

## 2014-11-21 VITALS — BP 121/87 | HR 77 | Temp 97.6°F | Ht 66.5 in | Wt 201.4 lb

## 2014-11-21 DIAGNOSIS — R51 Headache: Secondary | ICD-10-CM

## 2014-11-21 DIAGNOSIS — K219 Gastro-esophageal reflux disease without esophagitis: Secondary | ICD-10-CM

## 2014-11-21 DIAGNOSIS — I10 Essential (primary) hypertension: Secondary | ICD-10-CM

## 2014-11-21 DIAGNOSIS — R519 Headache, unspecified: Secondary | ICD-10-CM

## 2014-11-21 DIAGNOSIS — E785 Hyperlipidemia, unspecified: Secondary | ICD-10-CM

## 2014-11-21 DIAGNOSIS — K5909 Other constipation: Secondary | ICD-10-CM

## 2014-11-21 DIAGNOSIS — K5900 Constipation, unspecified: Secondary | ICD-10-CM

## 2014-11-21 MED ORDER — OLMESARTAN MEDOXOMIL 20 MG PO TABS: 10.0000 mg | ORAL_TABLET | Freq: Every day | ORAL | Status: DC

## 2014-11-21 NOTE — Progress Notes (Signed)
Pre visit review using our clinic review tool, if applicable. No additional management support is needed unless otherwise documented below in the visit note. 

## 2014-11-21 NOTE — Patient Instructions (Addendum)
Encouraged increased rest and hydration, add probiotics, zinc such as Coldeze or Xicam. Treat fevers as needed. Vitamin C, Mucinex twice daily. Elderberry liquid on line at [email or web address], [place].  Can use the Maxzide as needed for edema, selling or BP staying above 145/90 with rest   Upper Respiratory Infection, Adult An upper respiratory infection (URI) is also sometimes known as the common cold. The upper respiratory tract includes the nose, sinuses, throat, trachea, and bronchi. Bronchi are the airways leading to the lungs. Most people improve within 1 week, but symptoms can last up to 2 weeks. A residual cough may last even longer.  CAUSES Many different viruses can infect the tissues lining the upper respiratory tract. The tissues become irritated and inflamed and often become very moist. Mucus production is also common. A cold is contagious. You can easily spread the virus to others by oral contact. This includes kissing, sharing a glass, coughing, or sneezing. Touching your mouth or nose and then touching a surface, which is then touched by another person, can also spread the virus. SYMPTOMS  Symptoms typically develop 1 to 3 days after you come in contact with a cold virus. Symptoms vary from person to person. They may include:  Runny nose.  Sneezing.  Nasal congestion.  Sinus irritation.  Sore throat.  Loss of voice (laryngitis).  Cough.  Fatigue.  Muscle aches.  Loss of appetite.  Headache.  Low-grade fever. DIAGNOSIS  You might diagnose your own cold based on familiar symptoms, since most people get a cold 2 to 3 times a year. Your caregiver can confirm this based on your exam. Most importantly, your caregiver can check that your symptoms are not due to another disease such as strep throat, sinusitis, pneumonia, asthma, or epiglottitis. Blood tests, throat tests, and X-rays are not necessary to diagnose a common cold, but they may sometimes be helpful in  excluding other more serious diseases. Your caregiver will decide if any further tests are required. RISKS AND COMPLICATIONS  You may be at risk for a more severe case of the common cold if you smoke cigarettes, have chronic heart disease (such as heart failure) or lung disease (such as asthma), or if you have a weakened immune system. The very young and very old are also at risk for more serious infections. Bacterial sinusitis, middle ear infections, and bacterial pneumonia can complicate the common cold. The common cold can worsen asthma and chronic obstructive pulmonary disease (COPD). Sometimes, these complications can require emergency medical care and may be life-threatening. PREVENTION  The best way to protect against getting a cold is to practice good hygiene. Avoid oral or hand contact with people with cold symptoms. Wash your hands often if contact occurs. There is no clear evidence that vitamin C, vitamin E, echinacea, or exercise reduces the chance of developing a cold. However, it is always recommended to get plenty of rest and practice good nutrition. TREATMENT  Treatment is directed at relieving symptoms. There is no cure. Antibiotics are not effective, because the infection is caused by a virus, not by bacteria. Treatment may include:  Increased fluid intake. Sports drinks offer valuable electrolytes, sugars, and fluids.  Breathing heated mist or steam (vaporizer or shower).  Eating chicken soup or other clear broths, and maintaining good nutrition.  Getting plenty of rest.  Using gargles or lozenges for comfort.  Controlling fevers with ibuprofen or acetaminophen as directed by your caregiver.  Increasing usage of your inhaler if you have asthma. Zinc gel  and zinc lozenges, taken in the first 24 hours of the common cold, can shorten the duration and lessen the severity of symptoms. Pain medicines may help with fever, muscle aches, and throat pain. A variety of non-prescription  medicines are available to treat congestion and runny nose. Your caregiver can make recommendations and may suggest nasal or lung inhalers for other symptoms.  HOME CARE INSTRUCTIONS   Only take over-the-counter or prescription medicines for pain, discomfort, or fever as directed by your caregiver.  Use a warm mist humidifier or inhale steam from a shower to increase air moisture. This may keep secretions moist and make it easier to breathe.  Drink enough water and fluids to keep your urine clear or pale yellow.  Rest as needed.  Return to work when your temperature has returned to normal or as your caregiver advises. You may need to stay home longer to avoid infecting others. You can also use a face mask and careful hand washing to prevent spread of the virus. SEEK MEDICAL CARE IF:   After the first few days, you feel you are getting worse rather than better.  You need your caregiver's advice about medicines to control symptoms.  You develop chills, worsening shortness of breath, or brown or red sputum. These may be signs of pneumonia.  You develop yellow or brown nasal discharge or pain in the face, especially when you bend forward. These may be signs of sinusitis.  You develop a fever, swollen neck glands, pain with swallowing, or white areas in the back of your throat. These may be signs of strep throat. SEEK IMMEDIATE MEDICAL CARE IF:   You have a fever.  You develop severe or persistent headache, ear pain, sinus pain, or chest pain.  You develop wheezing, a prolonged cough, cough up blood, or have a change in your usual mucus (if you have chronic lung disease).  You develop sore muscles or a stiff neck. Document Released: 06/03/2001 Document Revised: 03/01/2012 Document Reviewed: 03/15/2014 [facility] Patient Information 2015 [place], [place]. This information is not intended to replace advice given to you by your health care provider. Make sure you discuss any questions you have  with your health care provider.

## 2014-11-24 ENCOUNTER — Other Ambulatory Visit: Payer: Self-pay | Admitting: Family Medicine

## 2014-11-26 NOTE — Assessment & Plan Note (Signed)
Well controlled, no changes to meds. Encouraged heart healthy diet such as the DASH diet and exercise as tolerated.  °

## 2014-11-26 NOTE — Progress Notes (Signed)
Kathy Huerta  [id number] [birth year] 11/26/2014      Progress Note-Follow Up  Subjective  Chief Complaint  Chief Complaint  Patient presents with  . Follow-up    3 month    HPI  Patient is a 53 y.o. female in today for routine medical care. Patient is feeling better today but has been struggling with respiratory illness. Has had malaise, myalgias, fatigue, congestion, sore throat, PND, ear pressure, clear runny nose, is improving now. Denies CP/palp/SOB/HA/fevers/GI or GU c/o. Taking meds as prescribed  Past Medical History  Diagnosis Date  . HTN (hypertension) 2002  . Depression   . Heartburn   . Basal cell carcinoma 11/17/11    Dr. Sammuel Hines  . Headache(784.0) 01/29/2013  . DDD (degenerative disc disease), thoracolumbar     on disability  . DDD (degenerative disc disease), cervical     s/p fusion  . Pancreatitis 04/24/2011  . Cutaneous skin tags 07/24/2013  . Pneumonia 07/24/2013  . Pain in joint, shoulder region 11/20/2013  . Tachycardia 08/12/2014  . Obesity   . Anxiety   . Colon polyp   . Hyperlipemia 08/2014    Past Surgical History  Procedure Laterality Date  . Anterior fusion cervical spine  2008    C6-7  Dr Saintclair Halsted  . Posterior fusion cervical spine  2011    C6-7  . Cholecystectomy N/A 02/27/2013    Procedure: LAPAROSCOPIC CHOLECYSTECTOMY WITH INTRAOPERATIVE CHOLANGIOGRAM;  Surgeon: Adin Hector, MD;  Location: [facility];  Service: General;  Laterality: N/A;    Family History  Problem Relation Age of Onset  . Hypertension Mother   . Hyperlipidemia Mother   . Other Father     DDD, colectomy,cardiac stent, afib  . Hypertension Brother   . Colon cancer Neg Hx   . Stomach cancer Neg Hx   . Colon polyps Father   . Diabetes Maternal Grandmother   . Kidney disease Neg Hx     History   Social History  . Marital Status: Married    Spouse Name: N/A    Number of Children: 0  . Years of Education: N/A   Occupational History  . Disabled    Social History Main  Topics  . Smoking status: Never Smoker   . Smokeless tobacco: Never Used  . Alcohol Use: 3.0 oz/week    5 Cans of beer per week  . Drug Use: No  . Sexual Activity: Not on file   Other Topics Concern  . Not on file   Social History Narrative   Reviewed history and no changes required.   Occupation: Disabled,  has worked in the past for a [employer] (medical))   Married   Alcohol use-yes- every weekend 4-5 beers.   Regular exercise-no   Smoking Status:  quit   Caffeine use/day:  none   Does Patient Exercise:  no          Current Outpatient Prescriptions on File Prior to Visit  Medication Sig Dispense Refill  . atorvastatin (LIPITOR) 10 MG tablet TAKE 1 TABLET (10 MG TOTAL) BY MOUTH DAILY. 30 tablet 2  . cyclobenzaprine (FLEXERIL) 10 MG tablet Take 10 mg by mouth daily.    . diclofenac sodium (VOLTAREN) 1 % GEL Apply 2 g topically 2 (two) times daily. To affected joint. 100 g 11  . meloxicam (MOBIC) 7.5 MG tablet Take 7.5 mg by mouth as needed.     Marland Kitchen oxyCODONE-acetaminophen (PERCOCET/ROXICET) 5-325 MG per tablet Take 1 tablet by  mouth every 4 (four) hours as needed for pain.    . pantoprazole (PROTONIX) 40 MG tablet Take 1 tablet (40 mg total) by mouth daily. 30 tablet 3  . pilocarpine (SALAGEN) 5 MG tablet Take 5 mg by mouth 3 (three) times daily. Pt takes 2 tablets, 5 mg, by mouth, three times a day    . propranolol (INNOPRAN XL) 80 MG 24 hr capsule Take 80 mg by mouth at bedtime.    Marland Kitchen tiZANidine (ZANAFLEX) 2 MG tablet Take 2 mg by mouth every 8 (eight) hours as needed.     No current facility-administered medications on file prior to visit.    Allergies  Allergen Reactions  . Amoxicillin-Pot Clavulanate     REACTION: Throat swelling.  . Azithromycin     REACTION: Throat Swelling  . Cefazolin     REACTION: Throat Swelling  . Moxifloxacin     REACTION: Throat Swelling  . Penicillins     REACTION: throat swelling  . Sulfonamide Derivatives     Review of  Systems  Review of Systems  Constitutional: Positive for malaise/fatigue. Negative for fever.  HENT: Negative for congestion.   Eyes: Negative for discharge.  Respiratory: Negative for shortness of breath.   Cardiovascular: Negative for chest pain, palpitations and leg swelling.  Gastrointestinal: Negative for nausea, abdominal pain and diarrhea.  Genitourinary: Negative for dysuria.  Musculoskeletal: Positive for back pain. Negative for falls.  Skin: Negative for rash.  Neurological: Negative for loss of consciousness and headaches.  Endo/Heme/Allergies: Negative for polydipsia.  Psychiatric/Behavioral: Negative for depression and suicidal ideas. The patient is not nervous/anxious and does not have insomnia.     Objective  BP 121/87 mmHg  Pulse 77  Temp(Src) 97.6 F (36.4 C) (Oral)  Ht 5' 6.5" (1.689 m)  Wt 201 lb 6.4 oz (91.354 kg)  BMI 32.02 kg/m2  SpO2 97%  LMP 09/23/2011  Physical Exam  Physical Exam  Constitutional: She is oriented to person, place, and time and well-developed, well-nourished, and in no distress. No distress.  HENT:  Head: Normocephalic and atraumatic.  Eyes: Conjunctivae are normal.  Neck: Neck supple. No thyromegaly present.  Cardiovascular: Normal rate, regular rhythm and normal heart sounds.   No murmur heard. Pulmonary/Chest: Effort normal and breath sounds normal. She has no wheezes.  Abdominal: She exhibits no distension and no mass.  Musculoskeletal: She exhibits no edema.  Lymphadenopathy:    She has no cervical adenopathy.  Neurological: She is alert and oriented to person, place, and time.  Skin: Skin is warm and dry. No rash noted. She is not diaphoretic.  Psychiatric: Memory, affect and judgment normal.    Lab Results  Component Value Date   TSH 3.38 11/15/2014   Lab Results  Component Value Date   WBC 7.5 11/15/2014   HGB 13.0 11/15/2014   HCT 38.7 11/15/2014   MCV 90.8 11/15/2014   PLT 308.0 11/15/2014   Lab Results    Component Value Date   CREATININE 0.7 11/15/2014   BUN 9 11/15/2014   NA 140 11/15/2014   K 4.3 11/15/2014   CL 105 11/15/2014   CO2 26 11/15/2014   Lab Results  Component Value Date   ALT 26 11/15/2014   AST 23 11/15/2014   ALKPHOS 75 11/15/2014   BILITOT 0.4 11/15/2014   Lab Results  Component Value Date   CHOL 175 11/15/2014   Lab Results  Component Value Date   HDL 54.20 11/15/2014   Lab Results  Component Value  Date   LDLCALC 93 11/15/2014   Lab Results  Component Value Date   TRIG 138.0 11/15/2014   Lab Results  Component Value Date   CHOLHDL 3 11/15/2014     Assessment & Plan  Hypertension Well controlled, no changes to meds. Encouraged heart healthy diet such as the DASH diet and exercise as tolerated.   Constipation, chronic Encouraged increased hydration and fiber in diet. Daily probiotics. If bowels not moving can use MOM 2 tbls po in 4 oz of warm prune juice by mouth every 2-3 days. If no results then repeat in 4 hours with  Dulcolax suppository pr, may repeat again in 4 more hours as needed. Seek care if symptoms worsen. Consider daily Miralax and/or Dulcolax if symptoms persist.   GERD (gastroesophageal reflux disease) Avoid offending foods, start probiotics. Do not eat large meals in late evening and consider raising head of bed.   Headache Encouraged increased hydration, 64 ounces of clear fluids daily. Minimize alcohol and caffeine. Eat small frequent meals with lean proteins and complex carbs. Avoid high and low blood sugars. Get adequate sleep, 7-8 hours a night. Needs exercise daily preferably in the morning.  Hyperlipidemia Tolerating statin, encouraged heart healthy diet, avoid trans fats, minimize simple carbs and saturated fats. Increase exercise as tolerated

## 2014-11-26 NOTE — Assessment & Plan Note (Signed)
Encouraged increased hydration, 64 ounces of clear fluids daily. Minimize alcohol and caffeine. Eat small frequent meals with lean proteins and complex carbs. Avoid high and low blood sugars. Get adequate sleep, 7-8 hours a night. Needs exercise daily preferably in the morning.  

## 2014-11-26 NOTE — Assessment & Plan Note (Signed)
Encouraged increased hydration and fiber in diet. Daily probiotics. If bowels not moving can use MOM 2 tbls po in 4 oz of warm prune juice by mouth every 2-3 days. If no results then repeat in 4 hours with  Dulcolax suppository pr, may repeat again in 4 more hours as needed. Seek care if symptoms worsen. Consider daily Miralax and/or Dulcolax if symptoms persist.  

## 2014-11-26 NOTE — Assessment & Plan Note (Signed)
Avoid offending foods, start probiotics. Do not eat large meals in late evening and consider raising head of bed.  

## 2014-11-26 NOTE — Assessment & Plan Note (Signed)
Tolerating statin, encouraged heart healthy diet, avoid trans fats, minimize simple carbs and saturated fats. Increase exercise as tolerated 

## 2014-12-29 ENCOUNTER — Other Ambulatory Visit: Payer: Self-pay | Admitting: Obstetrics and Gynecology

## 2014-12-29 DIAGNOSIS — N6310 Unspecified lump in the right breast, unspecified quadrant: Secondary | ICD-10-CM

## 2015-01-08 ENCOUNTER — Other Ambulatory Visit: Payer: Self-pay | Admitting: Family Medicine

## 2015-01-26 ENCOUNTER — Ambulatory Visit
Admission: RE | Admit: 2015-01-26 | Discharge: 2015-01-26 | Disposition: A | Discharge status: Home / Self Care | Payer: 59 | Source: Ambulatory Visit | Attending: Obstetrics and Gynecology | Admitting: Obstetrics and Gynecology

## 2015-01-26 ENCOUNTER — Other Ambulatory Visit: Payer: Self-pay | Admitting: Obstetrics and Gynecology

## 2015-01-26 DIAGNOSIS — N6310 Unspecified lump in the right breast, unspecified quadrant: Secondary | ICD-10-CM

## 2015-02-06 ENCOUNTER — Other Ambulatory Visit: Payer: Self-pay | Admitting: Internal Medicine

## 2015-02-06 ENCOUNTER — Other Ambulatory Visit: Payer: Self-pay | Admitting: Family Medicine

## 2015-03-26 ENCOUNTER — Other Ambulatory Visit (HOSPITAL_COMMUNITY)
Admission: RE | Admit: 2015-03-26 | Discharge: 2015-03-26 | Disposition: A | Discharge status: Home / Self Care | Payer: 59 | Source: Ambulatory Visit | Attending: Obstetrics and Gynecology | Admitting: Obstetrics and Gynecology

## 2015-03-26 ENCOUNTER — Other Ambulatory Visit: Payer: Self-pay | Admitting: Obstetrics and Gynecology

## 2015-03-26 DIAGNOSIS — Z01419 Encounter for gynecological examination (general) (routine) without abnormal findings: Secondary | ICD-10-CM | POA: Diagnosis present

## 2015-03-27 LAB — CYTOLOGY - PAP

## 2015-03-30 ENCOUNTER — Other Ambulatory Visit (INDEPENDENT_AMBULATORY_CARE_PROVIDER_SITE_OTHER): Payer: 59

## 2015-03-30 DIAGNOSIS — E785 Hyperlipidemia, unspecified: Secondary | ICD-10-CM | POA: Diagnosis not present

## 2015-03-30 DIAGNOSIS — I10 Essential (primary) hypertension: Secondary | ICD-10-CM

## 2015-03-30 LAB — HEPATIC FUNCTION PANEL
ALBUMIN: 4.3 g/dL (ref 3.5–5.2)
ALK PHOS: 88 U/L (ref 39–117)
ALT: 23 U/L (ref 0–35)
AST: 25 U/L (ref 0–37)
BILIRUBIN DIRECT: 0.1 mg/dL (ref 0.0–0.3)
Total Bilirubin: 0.5 mg/dL (ref 0.2–1.2)
Total Protein: 7.6 g/dL (ref 6.0–8.3)

## 2015-03-30 LAB — RENAL FUNCTION PANEL
Albumin: 4.3 g/dL (ref 3.5–5.2)
BUN: 10 mg/dL (ref 6–23)
CO2: 25 mEq/L (ref 19–32)
CREATININE: 0.56 mg/dL (ref 0.40–1.20)
Calcium: 9.4 mg/dL (ref 8.4–10.5)
Chloride: 104 mEq/L (ref 96–112)
GFR: 119.45 mL/min (ref >60.00)
GLUCOSE: 96 mg/dL (ref 70–99)
Phosphorus: 2.8 mg/dL (ref 2.3–4.6)
Potassium: 3.9 mEq/L (ref 3.5–5.1)
SODIUM: 138 meq/L (ref 135–145)

## 2015-03-30 LAB — LIPID PANEL
Cholesterol: 182 mg/dL (ref 0–200)
HDL: 53.3 mg/dL (ref >39.00)
LDL Cholesterol: 89 mg/dL (ref 0–99)
NONHDL: 128.7
Total CHOL/HDL Ratio: 3
Triglycerides: 197 mg/dL — ABNORMAL HIGH (ref 0.0–149.0)
VLDL: 39.4 mg/dL (ref 0.0–40.0)

## 2015-03-30 LAB — CBC
HCT: 40.1 % (ref 36.0–46.0)
Hemoglobin: 13.8 g/dL (ref 12.0–15.0)
MCHC: 34.4 g/dL (ref 30.0–36.0)
MCV: 88.9 fl (ref 78.0–100.0)
Platelets: 339 10*3/uL (ref 150.0–400.0)
RBC: 4.51 Mil/uL (ref 3.87–5.11)
RDW: 13.3 % (ref 11.5–15.5)
WBC: 6.2 10*3/uL (ref 4.0–10.5)

## 2015-03-30 LAB — TSH: TSH: 3.14 u[IU]/mL (ref 0.35–4.50)

## 2015-04-06 ENCOUNTER — Ambulatory Visit: Payer: 59 | Admitting: Family Medicine

## 2015-04-09 ENCOUNTER — Other Ambulatory Visit: Payer: Self-pay | Admitting: Family Medicine

## 2015-04-09 ENCOUNTER — Other Ambulatory Visit: Payer: Self-pay | Admitting: Obstetrics and Gynecology

## 2015-04-26 ENCOUNTER — Ambulatory Visit (INDEPENDENT_AMBULATORY_CARE_PROVIDER_SITE_OTHER): Payer: 59 | Admitting: Family Medicine

## 2015-04-26 VITALS — BP 120/82 | HR 103 | Temp 97.6°F | Ht 67.0 in | Wt 198.4 lb

## 2015-04-26 DIAGNOSIS — E785 Hyperlipidemia, unspecified: Secondary | ICD-10-CM

## 2015-04-26 DIAGNOSIS — K219 Gastro-esophageal reflux disease without esophagitis: Secondary | ICD-10-CM

## 2015-04-26 DIAGNOSIS — I10 Essential (primary) hypertension: Secondary | ICD-10-CM

## 2015-04-26 DIAGNOSIS — M5135 Other intervertebral disc degeneration, thoracolumbar region: Secondary | ICD-10-CM | POA: Diagnosis not present

## 2015-04-26 DIAGNOSIS — R000 Tachycardia, unspecified: Secondary | ICD-10-CM

## 2015-04-26 MED ORDER — SERTRALINE HCL 100 MG PO TABS: 150.0000 mg | ORAL_TABLET | Freq: Every day | ORAL | Status: DC

## 2015-04-26 MED ORDER — PROPRANOLOL HCL ER BEADS 80 MG PO CP24: 80.0000 mg | ORAL_CAPSULE | Freq: Every day | ORAL | Status: DC

## 2015-04-26 MED ORDER — SERTRALINE HCL 100 MG PO TABS: ORAL_TABLET | ORAL | Status: DC

## 2015-04-26 NOTE — Patient Instructions (Addendum)
Probiotic daily such as [place] can buy at [email or web address] Vitamin D 3 2000 IU daily ICool daily Black Cohosh   Cholesterol Cholesterol is a white, waxy, fat-like substance needed by your body in small amounts. The liver makes all the cholesterol you need. Cholesterol is carried from the liver by the blood through the blood vessels. Deposits of cholesterol (plaque) may build up on blood vessel walls. These make the arteries narrower and stiffer. Cholesterol plaques increase the risk for heart attack and stroke.  You cannot feel your cholesterol level even if it is very high. The only way to know it is high is with a blood test. Once you know your cholesterol levels, you should keep a record of the test results. Work with your health care provider to keep your levels in the desired range.  WHAT DO THE RESULTS MEAN?  Total cholesterol is a rough measure of all the cholesterol in your blood.   LDL is the so-called bad cholesterol. This is the type that deposits cholesterol in the walls of the arteries. You want this level to be low.   HDL is the good cholesterol because it cleans the arteries and carries the LDL away. You want this level to be high.  Triglycerides are fat that the body can either burn for energy or store. High levels are closely linked to heart disease.  WHAT ARE THE DESIRED LEVELS OF CHOLESTEROL?  Total cholesterol below 200.   LDL below 100 for people at risk, below 70 for those at very high risk.   HDL above 50 is good, above 60 is best.   Triglycerides below 150.  HOW CAN I LOWER MY CHOLESTEROL?  Diet. Follow your diet programs as directed by your health care provider.   Choose fish or white meat chicken and [place], roasted or baked. Limit fatty cuts of red meat, fried foods, and processed meats, such as sausage and lunch meats.   Eat lots of fresh fruits and vegetables.  Choose whole grains, beans, pasta, potatoes, and cereals.   Use  only small amounts of olive, corn, or canola oils.   Avoid butter, mayonnaise, shortening, or palm kernel oils.  Avoid foods with trans fats.   Drink skim or nonfat milk and eat low-fat or nonfat yogurt and cheeses. Avoid whole milk, cream, ice cream, egg yolks, and full-fat cheeses.   Healthy desserts include angel food cake, ginger snaps, animal crackers, hard candy, popsicles, and low-fat or nonfat frozen yogurt. Avoid pastries, cakes, pies, and cookies.   Exercise. Follow your exercise programs as directed by your health care provider.   A regular program helps decrease LDL and raise HDL.   A regular program helps with weight control.   Do things that increase your activity level like gardening, walking, or taking the stairs. Ask your health care provider about how you can be more active in your daily life.   Medicine. Take medicine only as directed by your health care provider.   Medicine may be prescribed by your health care provider to help lower cholesterol and decrease the risk for heart disease.   If you have several risk factors, you may need medicine even if your levels are normal. Document Released: 09/02/2001 Document Revised: 04/24/2014 Document Reviewed: 09/21/2013 [facility] Patient Information 2015 [place], [place]. This information is not intended to replace advice given to you by your health care provider. Make sure you discuss any questions you have with your health care provider.

## 2015-04-26 NOTE — Progress Notes (Signed)
Pre visit review using our clinic review tool, if applicable. No additional management support is needed unless otherwise documented below in the visit note. 

## 2015-05-12 ENCOUNTER — Encounter: Payer: Self-pay | Admitting: Family Medicine

## 2015-05-12 NOTE — Assessment & Plan Note (Signed)
Minimize caffeine and take Propranolol

## 2015-05-12 NOTE — Assessment & Plan Note (Signed)
Encouraged moist heat and gentle stretching as tolerated. May try NSAIDs and prescription meds as directed and report if symptoms worsen or seek immediate care 

## 2015-05-12 NOTE — Progress Notes (Signed)
Kathy Huerta  [id number] 03/23/1961 05/12/2015      Progress Note-Follow Up  Subjective  Chief Complaint  Chief Complaint  Patient presents with  . Follow-up    4 month    HPI  Patient is a 54 y.o. female in today for routine medical care. Patient in today for follow-up. Recent episode of nausea, vomiting and diarrhea, but only last 24 hours and has resolved. No residual symptoms. She also had some postmenopausal bleeding but saw her gynecologist and had a complete workup and no concerning lesions were found. She otherwise feels well. No recent illness. Denies CP/palp/SOB/HA/congestion/fevers/GI or GU c/o. Taking meds as prescribed  Past Medical History  Diagnosis Date  . HTN (hypertension) 2002  . Depression   . Heartburn   . Basal cell carcinoma 11/17/11    Dr. Sammuel Hines  . Headache(784.0) 01/29/2013  . DDD (degenerative disc disease), thoracolumbar     on disability  . DDD (degenerative disc disease), cervical     s/p fusion  . Pancreatitis 04/24/2011  . Cutaneous skin tags 07/24/2013  . Pneumonia 07/24/2013  . Pain in joint, shoulder region 11/20/2013  . Tachycardia 08/12/2014  . Obesity   . Anxiety   . Colon polyp   . Hyperlipemia 08/2014    Past Surgical History  Procedure Laterality Date  . Anterior fusion cervical spine  2008    C6-7  Dr Saintclair Halsted  . Posterior fusion cervical spine  2011    C6-7  . Cholecystectomy N/A 02/27/2013    Procedure: LAPAROSCOPIC CHOLECYSTECTOMY WITH INTRAOPERATIVE CHOLANGIOGRAM;  Surgeon: Adin Hector, MD;  Location: [facility];  Service: General;  Laterality: N/A;    Family History  Problem Relation Age of Onset  . Hypertension Mother   . Hyperlipidemia Mother   . Other Father     DDD, colectomy,cardiac stent, afib  . Hypertension Brother   . Colon cancer Neg Hx   . Stomach cancer Neg Hx   . Colon polyps Father   . Diabetes Maternal Grandmother   . Kidney disease Neg Hx     History   Social History  . Marital Status: Married   Spouse Name: N/A  . Number of Children: 0  . Years of Education: N/A   Occupational History  . Disabled    Social History Main Topics  . Smoking status: Never Smoker   . Smokeless tobacco: Never Used  . Alcohol Use: 3.0 oz/week    5 Cans of beer per week  . Drug Use: No  . Sexual Activity: Not on file   Other Topics Concern  . Not on file   Social History Narrative   Reviewed history and no changes required.   Occupation: Disabled,  has worked in the past for a [employer] (medical))   Married   Alcohol use-yes- every weekend 4-5 beers.   Regular exercise-no   Smoking Status:  quit   Caffeine use/day:  none   Does Patient Exercise:  no          Current Outpatient Prescriptions on File Prior to Visit  Medication Sig Dispense Refill  . atorvastatin (LIPITOR) 10 MG tablet TAKE 1 TABLET (10 MG TOTAL) BY MOUTH DAILY. 30 tablet 6  . cyclobenzaprine (FLEXERIL) 10 MG tablet Take 10 mg by mouth daily.    . meloxicam (MOBIC) 7.5 MG tablet Take 7.5 mg by mouth as needed.     Marland Kitchen olmesartan (BENICAR) 20 MG tablet Take 0.5 tablets (10 mg total) by mouth  daily. 45 tablet 3  . oxyCODONE-acetaminophen (PERCOCET/ROXICET) 5-325 MG per tablet Take 1 tablet by mouth every 4 (four) hours as needed for pain.    . pantoprazole (PROTONIX) 40 MG tablet TAKE 1 TABLET (40 MG TOTAL) BY MOUTH DAILY. 30 tablet 4  . pilocarpine (SALAGEN) 5 MG tablet Take 5 mg by mouth 3 (three) times daily. Pt takes 2 tablets, 5 mg, by mouth, three times a day    . tiZANidine (ZANAFLEX) 2 MG tablet Take 2 mg by mouth every 8 (eight) hours as needed.     No current facility-administered medications on file prior to visit.    Allergies  Allergen Reactions  . Amoxicillin-Pot Clavulanate     REACTION: Throat swelling.  . Azithromycin     REACTION: Throat Swelling  . Cefazolin     REACTION: Throat Swelling  . Moxifloxacin     REACTION: Throat Swelling  . Penicillins     REACTION: throat swelling  .  Sulfonamide Derivatives     Review of Systems  Review of Systems  Constitutional: Negative for fever and malaise/fatigue.  HENT: Negative for congestion.   Eyes: Negative for discharge.  Respiratory: Negative for shortness of breath.   Cardiovascular: Negative for chest pain, palpitations and leg swelling.  Gastrointestinal: Positive for nausea, vomiting and diarrhea. Negative for abdominal pain.  Genitourinary: Negative for dysuria.  Musculoskeletal: Negative for falls.  Skin: Negative for rash.  Neurological: Negative for loss of consciousness and headaches.  Endo/Heme/Allergies: Negative for polydipsia.  Psychiatric/Behavioral: Negative for depression and suicidal ideas. The patient is not nervous/anxious and does not have insomnia.     Objective  BP 120/82 mmHg  Pulse 103  Temp(Src) 97.6 F (36.4 C) (Oral)  Ht 5\' 7"  (1.702 m)  Wt 198 lb 6 oz (89.982 kg)  BMI 31.06 kg/m2  SpO2 96%  LMP 09/23/2011  Physical Exam  Physical Exam  Constitutional: She is oriented to person, place, and time and well-developed, well-nourished, and in no distress. No distress.  HENT:  Head: Normocephalic and atraumatic.  Eyes: Conjunctivae are normal.  Neck: Neck supple. No thyromegaly present.  Cardiovascular: Normal rate, regular rhythm and normal heart sounds.   No murmur heard. Pulmonary/Chest: Effort normal and breath sounds normal. She has no wheezes.  Abdominal: She exhibits no distension and no mass.  Musculoskeletal: She exhibits no edema.  Lymphadenopathy:    She has no cervical adenopathy.  Neurological: She is alert and oriented to person, place, and time.  Skin: Skin is warm and dry. No rash noted. She is not diaphoretic.  Psychiatric: Memory, affect and judgment normal.    Lab Results  Component Value Date   TSH 3.14 03/30/2015   Lab Results  Component Value Date   WBC 6.2 03/30/2015   HGB 13.8 03/30/2015   HCT 40.1 03/30/2015   MCV 88.9 03/30/2015   PLT 339.0  03/30/2015   Lab Results  Component Value Date   CREATININE 0.56 03/30/2015   BUN 10 03/30/2015   NA 138 03/30/2015   K 3.9 03/30/2015   CL 104 03/30/2015   CO2 25 03/30/2015   Lab Results  Component Value Date   ALT 23 03/30/2015   AST 25 03/30/2015   ALKPHOS 88 03/30/2015   BILITOT 0.5 03/30/2015   Lab Results  Component Value Date   CHOL 182 03/30/2015   Lab Results  Component Value Date   HDL 53.30 03/30/2015   Lab Results  Component Value Date   LDLCALC 89 03/30/2015  Lab Results  Component Value Date   TRIG 197.0* 03/30/2015   Lab Results  Component Value Date   CHOLHDL 3 03/30/2015     Assessment & Plan  Hypertension Well controlled, no changes to meds. Encouraged heart healthy diet such as the DASH diet and exercise as tolerated.    GERD (gastroesophageal reflux disease) Avoid offending foods, start probiotics. Do not eat large meals in late evening and consider raising head of bed.    Hyperlipidemia Tolerating statin, encouraged heart healthy diet, avoid trans fats, minimize simple carbs and saturated fats. Increase exercise as tolerated   DDD (degenerative disc disease), thoracolumbar Encouraged moist heat and gentle stretching as tolerated. May try NSAIDs and prescription meds as directed and report if symptoms worsen or seek immediate care   Tachycardia Minimize caffeine and take Propranolol

## 2015-05-12 NOTE — Assessment & Plan Note (Signed)
Well controlled, no changes to meds. Encouraged heart healthy diet such as the DASH diet and exercise as tolerated.  °

## 2015-05-12 NOTE — Assessment & Plan Note (Signed)
Tolerating statin, encouraged heart healthy diet, avoid trans fats, minimize simple carbs and saturated fats. Increase exercise as tolerated 

## 2015-05-12 NOTE — Assessment & Plan Note (Signed)
Avoid offending foods, start probiotics. Do not eat large meals in late evening and consider raising head of bed.  

## 2015-06-08 ENCOUNTER — Other Ambulatory Visit: Payer: Self-pay | Admitting: Family Medicine

## 2015-07-09 ENCOUNTER — Other Ambulatory Visit: Payer: Self-pay | Admitting: Internal Medicine

## 2015-08-03 ENCOUNTER — Telehealth: Payer: Self-pay | Admitting: Family Medicine

## 2015-08-03 ENCOUNTER — Ambulatory Visit: Payer: 59 | Admitting: Family Medicine

## 2015-08-03 NOTE — Telephone Encounter (Signed)
Received records from [facility] forwarded 3 pages to Dr. Hulan Saas 08/03/15 fbg.

## 2015-08-07 ENCOUNTER — Encounter: Payer: Self-pay | Admitting: Family Medicine

## 2015-08-07 ENCOUNTER — Ambulatory Visit (INDEPENDENT_AMBULATORY_CARE_PROVIDER_SITE_OTHER): Payer: 59 | Admitting: Family Medicine

## 2015-08-07 VITALS — BP 122/80 | HR 88 | Temp 98.4°F | Ht 67.0 in | Wt 200.0 lb

## 2015-08-07 DIAGNOSIS — K219 Gastro-esophageal reflux disease without esophagitis: Secondary | ICD-10-CM | POA: Diagnosis not present

## 2015-08-07 DIAGNOSIS — M549 Dorsalgia, unspecified: Secondary | ICD-10-CM

## 2015-08-07 DIAGNOSIS — F329 Major depressive disorder, single episode, unspecified: Secondary | ICD-10-CM | POA: Diagnosis not present

## 2015-08-07 DIAGNOSIS — I10 Essential (primary) hypertension: Secondary | ICD-10-CM

## 2015-08-07 DIAGNOSIS — K5900 Constipation, unspecified: Secondary | ICD-10-CM | POA: Diagnosis not present

## 2015-08-07 DIAGNOSIS — R000 Tachycardia, unspecified: Secondary | ICD-10-CM

## 2015-08-07 DIAGNOSIS — F32A Depression, unspecified: Secondary | ICD-10-CM

## 2015-08-07 DIAGNOSIS — E785 Hyperlipidemia, unspecified: Secondary | ICD-10-CM

## 2015-08-07 DIAGNOSIS — K5909 Other constipation: Secondary | ICD-10-CM

## 2015-08-07 MED ORDER — PROPRANOLOL HCL ER BEADS 80 MG PO CP24: 80.0000 mg | ORAL_CAPSULE | Freq: Every day | ORAL | Status: DC

## 2015-08-07 NOTE — Assessment & Plan Note (Signed)
Well controlled, no changes to meds. Encouraged heart healthy diet such as the DASH diet and exercise as tolerated.  °

## 2015-08-07 NOTE — Assessment & Plan Note (Signed)
Improvement is noted with increased Zoloft continue same

## 2015-08-07 NOTE — Patient Instructions (Signed)
Add Benefiber   Cholesterol Cholesterol is a white, waxy, fat-like substance needed by your body in small amounts. The liver makes all the cholesterol you need. Cholesterol is carried from the liver by the blood through the blood vessels. Deposits of cholesterol (plaque) may build up on blood vessel walls. These make the arteries narrower and stiffer. Cholesterol plaques increase the risk for heart attack and stroke.  You cannot feel your cholesterol level even if it is very high. The only way to know it is high is with a blood test. Once you know your cholesterol levels, you should keep a record of the test results. Work with your health care provider to keep your levels in the desired range.  WHAT DO THE RESULTS MEAN?  Total cholesterol is a rough measure of all the cholesterol in your blood.   LDL is the so-called bad cholesterol. This is the type that deposits cholesterol in the walls of the arteries. You want this level to be low.   HDL is the good cholesterol because it cleans the arteries and carries the LDL away. You want this level to be high.  Triglycerides are fat that the body can either burn for energy or store. High levels are closely linked to heart disease.  WHAT ARE THE DESIRED LEVELS OF CHOLESTEROL?  Total cholesterol below 200.   LDL below 100 for people at risk, below 70 for those at very high risk.   HDL above 50 is good, above 60 is best.   Triglycerides below 150.  HOW CAN I LOWER MY CHOLESTEROL?  Diet. Follow your diet programs as directed by your health care provider.   Choose fish or white meat chicken and [place], roasted or baked. Limit fatty cuts of red meat, fried foods, and processed meats, such as sausage and lunch meats.   Eat lots of fresh fruits and vegetables.  Choose whole grains, beans, pasta, potatoes, and cereals.   Use only small amounts of olive, corn, or canola oils.   Avoid butter, mayonnaise, shortening, or palm kernel  oils.  Avoid foods with trans fats.   Drink skim or nonfat milk and eat low-fat or nonfat yogurt and cheeses. Avoid whole milk, cream, ice cream, egg yolks, and full-fat cheeses.   Healthy desserts include angel food cake, ginger snaps, animal crackers, hard candy, popsicles, and low-fat or nonfat frozen yogurt. Avoid pastries, cakes, pies, and cookies.   Exercise. Follow your exercise programs as directed by your health care provider.   A regular program helps decrease LDL and raise HDL.   A regular program helps with weight control.   Do things that increase your activity level like gardening, walking, or taking the stairs. Ask your health care provider about how you can be more active in your daily life.   Medicine. Take medicine only as directed by your health care provider.   Medicine may be prescribed by your health care provider to help lower cholesterol and decrease the risk for heart disease.   If you have several risk factors, you may need medicine even if your levels are normal. Document Released: 09/02/2001 Document Revised: 04/24/2014 Document Reviewed: 09/21/2013 [facility] Patient Information 2015 [place], [place]. This information is not intended to replace advice given to you by your health care provider. Make sure you discuss any questions you have with your health care provider.

## 2015-08-07 NOTE — Assessment & Plan Note (Signed)
Avoid offending foods, continue probiotics, the NOW product has been the most helpful. Do not eat large meals in late evening and consider raising head of bed.

## 2015-08-07 NOTE — Progress Notes (Signed)
Pre visit review using our clinic review tool, if applicable. No additional management support is needed unless otherwise documented below in the visit note. 

## 2015-08-07 NOTE — Assessment & Plan Note (Signed)
With roughly 3 episodes of loose, watery stool weekly, add a fiber supplement such as Benefiber

## 2015-08-07 NOTE — Progress Notes (Signed)
Patient ID: Kathy Huerta, female   DOB: Aug 26, 1961, 54 y.o.   MRN: [id number]   Subjective:    Patient ID: Kathy Huerta, female    DOB: [birth year], 54 y.o.   MRN: [id number]  Chief Complaint  Patient presents with  . Follow-up    HPI Patient is in today for follow up on numerous concerns. No recent acute illness. Continues to have intermittent trouble with back pain, no recent injury or radicular symptoms. No acute concern. Trying to maintain a heart healthy diet. Denies CP/palp/SOB/HA/congestion/fevers/GI or GU c/o. Taking meds as prescribed  Past Medical History  Diagnosis Date  . HTN (hypertension) 2002  . Depression   . Heartburn   . Basal cell carcinoma 11/17/11    Dr. Sammuel Hines  . Headache(784.0) 01/29/2013  . DDD (degenerative disc disease), thoracolumbar     on disability  . DDD (degenerative disc disease), cervical     s/p fusion  . Pancreatitis 04/24/2011  . Cutaneous skin tags 07/24/2013  . Pneumonia 07/24/2013  . Pain in joint, shoulder region 11/20/2013  . Tachycardia 08/12/2014  . Obesity   . Anxiety   . Colon polyp   . Hyperlipemia 08/2014    Past Surgical History  Procedure Laterality Date  . Anterior fusion cervical spine  2008    C6-7  Dr Saintclair Halsted  . Posterior fusion cervical spine  2011    C6-7  . Cholecystectomy N/A 02/27/2013    Procedure: LAPAROSCOPIC CHOLECYSTECTOMY WITH INTRAOPERATIVE CHOLANGIOGRAM;  Surgeon: Adin Hector, MD;  Location: [facility];  Service: General;  Laterality: N/A;    Family History  Problem Relation Age of Onset  . Hypertension Mother   . Hyperlipidemia Mother   . Other Father     DDD, colectomy,cardiac stent, afib  . Hypertension Brother   . Colon cancer Neg Hx   . Stomach cancer Neg Hx   . Colon polyps Father   . Diabetes Maternal Grandmother   . Kidney disease Neg Hx     Social History   Social History  . Marital Status: Married    Spouse Name: N/A  . Number of Children: 0  . Years of Education: N/A   Occupational History   . Disabled    Social History Main Topics  . Smoking status: Never Smoker   . Smokeless tobacco: Never Used  . Alcohol Use: 3.0 oz/week    5 Cans of beer per week  . Drug Use: No  . Sexual Activity: Not on file   Other Topics Concern  . Not on file   Social History Narrative   Reviewed history and no changes required.   Occupation: Disabled,  has worked in the past for a [employer] (medical))   Married   Alcohol use-yes- every weekend 4-5 beers.   Regular exercise-no   Smoking Status:  quit   Caffeine use/day:  none   Does Patient Exercise:  no          Outpatient Prescriptions Prior to Visit  Medication Sig Dispense Refill  . atorvastatin (LIPITOR) 10 MG tablet TAKE 1 TABLET (10 MG TOTAL) BY MOUTH DAILY. 30 tablet 6  . cyclobenzaprine (FLEXERIL) 10 MG tablet Take 10 mg by mouth daily.    . meloxicam (MOBIC) 7.5 MG tablet Take 7.5 mg by mouth as needed.     Marland Kitchen olmesartan (BENICAR) 20 MG tablet Take 0.5 tablets (10 mg total) by mouth daily. 45 tablet 3  . oxyCODONE-acetaminophen (PERCOCET/ROXICET) 5-325 MG per tablet Take  1 tablet by mouth every 4 (four) hours as needed for pain.    . pantoprazole (PROTONIX) 40 MG tablet TAKE 1 TABLET (40 MG TOTAL) BY MOUTH DAILY. 30 tablet 3  . pilocarpine (SALAGEN) 5 MG tablet Take 5 mg by mouth 3 (three) times daily. Pt takes 2 tablets, 5 mg, by mouth, three times a day    . sertraline (ZOLOFT) 100 MG tablet Take 1.5 tablets (150 mg total) by mouth daily. TAKE 1 TABLET (100 MG TOTAL) BY MOUTH DAILY. 135 tablet 3  . tiZANidine (ZANAFLEX) 2 MG tablet Take 2 mg by mouth every 8 (eight) hours as needed.    . propranolol (INNOPRAN XL) 80 MG 24 hr capsule Take 1 capsule (80 mg total) by mouth at bedtime. 90 capsule 3  . propranolol ER (INDERAL LA) 80 MG 24 hr capsule TAKE 1 CAPSULE (80 MG TOTAL) BY MOUTH AS NEEDED. 30 capsule 0   No facility-administered medications prior to visit.    Allergies  Allergen Reactions  .  Amoxicillin-Pot Clavulanate     REACTION: Throat swelling.  . Azithromycin     REACTION: Throat Swelling  . Cefazolin     REACTION: Throat Swelling  . Moxifloxacin     REACTION: Throat Swelling  . Penicillins     REACTION: throat swelling  . Sulfonamide Derivatives     Review of Systems  Constitutional: Negative for fever and malaise/fatigue.  HENT: Negative for congestion.   Eyes: Negative for discharge.  Respiratory: Negative for shortness of breath.   Cardiovascular: Negative for chest pain, palpitations and leg swelling.  Gastrointestinal: Positive for diarrhea. Negative for nausea and abdominal pain.  Genitourinary: Negative for dysuria.  Musculoskeletal: Positive for back pain. Negative for falls.  Skin: Negative for rash.  Neurological: Negative for loss of consciousness and headaches.  Endo/Heme/Allergies: Negative for environmental allergies.  Psychiatric/Behavioral: Negative for depression. The patient is not nervous/anxious.        Objective:    Physical Exam  Constitutional: She is oriented to person, place, and time. She appears well-developed and well-nourished. No distress.  HENT:  Head: Normocephalic and atraumatic.  Nose: Nose normal.  Eyes: Right eye exhibits no discharge. Left eye exhibits no discharge.  Neck: Normal range of motion. Neck supple.  Cardiovascular: Normal rate and regular rhythm.   No murmur heard. Pulmonary/Chest: Effort normal and breath sounds normal.  Abdominal: Soft. Bowel sounds are normal. There is no tenderness.  Musculoskeletal: She exhibits no edema.  Neurological: She is alert and oriented to person, place, and time.  Skin: Skin is warm and dry.  Psychiatric: She has a normal mood and affect.  Nursing note and vitals reviewed.   BP 126/95 mmHg  Pulse 88  Temp(Src) 98.4 F (36.9 C) (Oral)  Ht 5\' 7"  (1.702 m)  Wt 200 lb (90.719 kg)  BMI 31.32 kg/m2  SpO2 98%  LMP 09/23/2011 Wt Readings from Last 3 Encounters:    08/07/15 200 lb (90.719 kg)  04/26/15 198 lb 6 oz (89.982 kg)  11/21/14 201 lb 6.4 oz (91.354 kg)     Lab Results  Component Value Date   WBC 6.2 03/30/2015   HGB 13.8 03/30/2015   HCT 40.1 03/30/2015   PLT 339.0 03/30/2015   GLUCOSE 96 03/30/2015   CHOL 182 03/30/2015   TRIG 197.0* 03/30/2015   HDL 53.30 03/30/2015   LDLCALC 89 03/30/2015   ALT 23 03/30/2015   AST 25 03/30/2015   NA 138 03/30/2015   K 3.9 03/30/2015  CL 104 03/30/2015   CREATININE 0.56 03/30/2015   BUN 10 03/30/2015   CO2 25 03/30/2015   TSH 3.14 03/30/2015    Lab Results  Component Value Date   TSH 3.14 03/30/2015   Lab Results  Component Value Date   WBC 6.2 03/30/2015   HGB 13.8 03/30/2015   HCT 40.1 03/30/2015   MCV 88.9 03/30/2015   PLT 339.0 03/30/2015   Lab Results  Component Value Date   NA 138 03/30/2015   K 3.9 03/30/2015   CO2 25 03/30/2015   GLUCOSE 96 03/30/2015   BUN 10 03/30/2015   CREATININE 0.56 03/30/2015   BILITOT 0.5 03/30/2015   ALKPHOS 88 03/30/2015   AST 25 03/30/2015   ALT 23 03/30/2015   PROT 7.6 03/30/2015   ALBUMIN 4.3 03/30/2015   CALCIUM 9.4 03/30/2015   GFR 119.45 03/30/2015   Lab Results  Component Value Date   CHOL 182 03/30/2015   Lab Results  Component Value Date   HDL 53.30 03/30/2015   Lab Results  Component Value Date   LDLCALC 89 03/30/2015   Lab Results  Component Value Date   TRIG 197.0* 03/30/2015   Lab Results  Component Value Date   CHOLHDL 3 03/30/2015   No results found for: HGBA1C     Assessment & Plan:  Hypertension Well controlled, no changes to meds. Encouraged heart healthy diet such as the DASH diet and exercise as tolerated.   GERD (gastroesophageal reflux disease) Avoid offending foods, continue probiotics, the NOW product has been the most helpful. Do not eat large meals in late evening and consider raising head of bed.   Depression Improvement is noted with increased Zoloft continue same  Constipation,  chronic With roughly 3 episodes of loose, watery stool weekly, add a fiber supplement such as Benefiber  Back pain Encouraged moist heat and gentle stretching as tolerated. May try NSAIDs and prescription meds as directed and report if symptoms worsen or seek immediate care. Allowed refill on Tizanidine  Hyperlipidemia Encouraged heart healthy diet, increase exercise, avoid trans fats, consider a krill oil cap daily  Tachycardia RRR today   I have discontinued Ms. Delpriore propranolol ER. I am also having her maintain her tiZANidine, meloxicam, cyclobenzaprine, oxyCODONE-acetaminophen, pilocarpine, olmesartan, atorvastatin, sertraline, pantoprazole, and propranolol.  Meds ordered this encounter  Medications  . propranolol (INNOPRAN XL) 80 MG 24 hr capsule    Sig: Take 1 capsule (80 mg total) by mouth at bedtime.    Dispense:  90 capsule    Refill:  [place], LPN

## 2015-08-19 NOTE — Assessment & Plan Note (Signed)
Encouraged moist heat and gentle stretching as tolerated. May try NSAIDs and prescription meds as directed and report if symptoms worsen or seek immediate care. Allowed refill on Tizanidine

## 2015-08-19 NOTE — Assessment & Plan Note (Signed)
Encouraged heart healthy diet, increase exercise, avoid trans fats, consider a krill oil cap daily 

## 2015-08-19 NOTE — Assessment & Plan Note (Signed)
RRR today 

## 2015-08-20 ENCOUNTER — Other Ambulatory Visit (INDEPENDENT_AMBULATORY_CARE_PROVIDER_SITE_OTHER): Payer: 59

## 2015-08-20 ENCOUNTER — Ambulatory Visit (INDEPENDENT_AMBULATORY_CARE_PROVIDER_SITE_OTHER): Payer: 59 | Admitting: Family Medicine

## 2015-08-20 ENCOUNTER — Encounter: Payer: Self-pay | Admitting: Family Medicine

## 2015-08-20 VITALS — BP 112/84 | HR 78 | Ht 67.0 in | Wt 201.0 lb

## 2015-08-20 DIAGNOSIS — M25522 Pain in left elbow: Secondary | ICD-10-CM | POA: Diagnosis not present

## 2015-08-20 DIAGNOSIS — S42433A Displaced fracture (avulsion) of lateral epicondyle of unspecified humerus, initial encounter for closed fracture: Secondary | ICD-10-CM | POA: Diagnosis not present

## 2015-08-20 MED ORDER — VITAMIN D (ERGOCALCIFEROL) 1.25 MG (50000 UNIT) PO CAPS: 50000.0000 [IU] | ORAL_CAPSULE | ORAL | Status: DC

## 2015-08-20 NOTE — Progress Notes (Signed)
Pre visit review using our clinic review tool, if applicable. No additional management support is needed unless otherwise documented below in the visit note. 

## 2015-08-20 NOTE — Assessment & Plan Note (Signed)
She does have more of a lateral epicondylar region. We discussed icing regimen, patient was put in a wrist brace to avoid any significant extension. We discussed topical anti-inflammatory's, icing, and what activities to do and which ones to potentially avoid. Patient come back and see me again in 3-4 weeks for further evaluation and treatment. We discussed vitamin D supplementation.  Spent  25 minutes with patient face-to-face and had greater than 50% of counseling including as described above in assessment and plan.

## 2015-08-20 NOTE — Patient Instructions (Addendum)
So good to see you!!! Called in once weekly Vitamin D pill, 50,000units (73months)  Wrist brace day and night for 1 week, then nightly for 1 week Try Pennsaid, topical anti-inflammatory, to your elbow 2x/day  Stay on your feet! See me again in 3 weeks.

## 2015-08-20 NOTE — Progress Notes (Signed)
  [facility] [address], [place] [address] Phone: [phone] Subjective:     CC: Elbow pain, left  [id number] Kathy Huerta is a 54 y.o. female coming in with complaint of left elbow pain after fall. Approximately 2 months ago. Patient felt pain immediately. Did have some swelling and bruising. States that it seemed to get better. Now though unfortunately with certain movements it can be very painful. States that there is some mild weakness. Patient states that she puts pressure directly on the elbow it can hurt as well. Denies any loss of range of motion. Denies any numbness. States that he can even wake her up at night. Rates the severity of 7 out of 10. 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     Past medical history, social, surgical and family history all reviewed in electronic medical record.   Review of Systems: No headache, visual changes, nausea, vomiting, diarrhea, constipation, dizziness, abdominal pain, skin rash, fevers, chills, night sweats, weight loss, swollen lymph nodes, body aches, joint swelling, muscle aches, chest pain, shortness of breath, mood changes.   Objective Blood pressure 112/84, pulse 78, height 5\' 7"  (1.702 m), weight 201 lb (91.173 kg), last menstrual period 09/23/2011, SpO2 97 %.  General: No apparent distress alert and oriented x3 mood and affect normal, dressed appropriately.  HEENT: Pupils equal, extraocular movements intact  Respiratory: Patient's speak in full sentences and does not appear short of breath  Cardiovascular: No lower extremity edema, non tender, no erythema  Skin: Warm dry intact with no signs of infection or rash on extremities or on axial skeleton.  Abdomen: Soft nontender  Neuro: Cranial nerves II through XII are intact, neurovascularly intact in all extremities with 2+ DTRs and 2+ pulses.  Lymph: No lymphadenopathy of posterior or anterior  cervical chain or axillae bilaterally.  Gait normal with good balance and coordination.  MSK:  Non tender with full range of motion and good stability and symmetric strength and tone of shoulders,  wrist, hip, knee and ankles bilaterally.  Elbow: Left Unremarkable to inspection. Range of motion full pronation, supination, flexion, extension. Strength is full to all of the above directions Stable to varus, valgus stress. Negative moving valgus stress test. Severe tenderness over the lateral epicondylar region. Ulnar nerve does not sublux. Negative cubital tunnel Tinel's. Contralateral elbow unremarkable  Musculoskeletal ultrasound was performed and interpreted by Charlann Boxer D.O.   Elbow: Left Lateral epicondyle and common extensor tendon origin visualized. No avulsion noted. Appears that this was more of a posttraumatic. Mild callus formation noted. Normal hypoechoic changes over the lateral epicondylar region and the common extensor tendon. No retraction noted. Radial head unremarkable and located in annular ligament Medial epicondyle and common flexor tendon origin visualized.  No edema, effusions, or avulsions seen. Ulnar nerve in cubital tunnel unremarkable. Olecranon and triceps insertion visualized and unremarkable without edema, effusion, or avulsion.  No signs olecranon bursitis. Power doppler signal normal.  IMPRESSION:  Avulsion fracture likely secondary to posttraumatic.     Impression and Recommendations:     This case required medical decision making of moderate complexity.

## 2015-08-27 ENCOUNTER — Encounter: Payer: Self-pay | Admitting: Family Medicine

## 2015-09-11 ENCOUNTER — Ambulatory Visit (INDEPENDENT_AMBULATORY_CARE_PROVIDER_SITE_OTHER): Payer: 59 | Admitting: Family Medicine

## 2015-09-11 ENCOUNTER — Encounter: Payer: Self-pay | Admitting: Family Medicine

## 2015-09-11 VITALS — BP 118/84 | HR 79 | Ht 67.0 in | Wt 203.0 lb

## 2015-09-11 DIAGNOSIS — S42433A Displaced fracture (avulsion) of lateral epicondyle of unspecified humerus, initial encounter for closed fracture: Secondary | ICD-10-CM

## 2015-09-11 NOTE — Progress Notes (Signed)
Pre visit review using our clinic review tool, if applicable. No additional management support is needed unless otherwise documented below in the visit note. 

## 2015-09-11 NOTE — Patient Instructions (Addendum)
Great to see you as always.  We will give you another 3-4 weeks.  Ice is your friend Exercises 2 times a week.  I would [place] brace in airport.  Husband lifts everything!!!  See me again in 4 weeks and we will discuss other injection vs possible physical therapy.

## 2015-09-11 NOTE — Progress Notes (Signed)
  Kathy Huerta, [place] [address] Phone: [phone] Subjective:     CC: Elbow pain, left  [id number] [name] is a 54 y.o. female coming in with complaint of left elbow pain after fall. Patient was seen and had an avulsion fracture of the lateral epicondylar region. Patient did not have any significant healing at that time. Patient was put in a wrist immobilizer brace, is going to do anti-inflammatory's, icing as well as increasing her vitamin D supplementation. Patient states she is made some improvement. Would state that she 75% better. States that the pain is improved but continues to have weakness. Patient rates the severity of discomfort as more of 3 out of 10. Not stopping her from many daily activities but if she tries to hold anything longer than 10 seconds she drops it. Denies any neck pain associated with it. Denies any swelling. Patient is leaving town and wants to make sure that this is better.    Past medical history, social, surgical and family history all reviewed in electronic medical record.   Review of Systems: No headache, visual changes, nausea, vomiting, diarrhea, constipation, dizziness, abdominal pain, skin rash, fevers, chills, night sweats, weight loss, swollen lymph nodes, body aches, joint swelling, muscle aches, chest pain, shortness of breath, mood changes.   Objective Blood pressure 118/84, pulse 79, height 5\' 7"  (1.702 m), weight 203 lb (92.08 kg), last menstrual period 09/23/2011, SpO2 95 %.  General: No apparent distress alert and oriented x3 mood and affect normal, dressed appropriately.  HEENT: Pupils equal, extraocular movements intact  Respiratory: Patient's speak in full sentences and does not appear short of breath  Cardiovascular: No lower extremity edema, non tender, no erythema  Skin: Warm dry intact with no signs of infection or rash on extremities or on axial skeleton.  Abdomen: Soft nontender   Neuro: Cranial nerves II through XII are intact, neurovascularly intact in all extremities with 2+ DTRs and 2+ pulses.  Lymph: No lymphadenopathy of posterior or anterior cervical chain or axillae bilaterally.  Gait normal with good balance and coordination.  MSK:  Non tender with full range of motion and good stability and symmetric strength and tone of shoulders,  wrist, hip, knee and ankles bilaterally.  Elbow: Left Unremarkable to inspection. Range of motion full pronation, supination, flexion, extension. Strength is full to all of the above directions Stable to varus, valgus stress. Negative moving valgus stress test. Moderate tenderness of the lateral epicondylar region Ulnar nerve does not sublux. Negative cubital tunnel Tinel's. Contralateral elbow unremarkable  Musculoskeletal ultrasound was performed and interpreted by Charlann Boxer D.O.   Elbow: Left Lateral epicondyle and common extensor tendon origin visualized.. callus formation noted. Patient does have what appears to have a full-thickness tear of the common extensor tendon noted with mild retraction of 0.4 cm Radial head unremarkable and located in annular ligament Medial epicondyle and common flexor tendon origin visualized.  No edema, effusions, or avulsions seen. Ulnar nerve in cubital tunnel unremarkable. Olecranon and triceps insertion visualized and unremarkable without edema, effusion, or avulsion.  No signs olecranon bursitis. Power doppler signal normal.  IMPRESSION:  Healing avulsion fracture but full tear of the common extensor tendon with retraction    Impression and Recommendations:     This case required medical decision making of moderate complexity.

## 2015-09-11 NOTE — Assessment & Plan Note (Signed)
Patient's fracture seems to be healing but patient does have a full-thickness tear of the common extensor tendon. There is some mild retraction. We discussed different treatment options from anything of conservative therapy to surgical intervention. Patient has elected to try to continue to monitor for conservative therapy. Patient will continue the same medications, icing, immobilization of the wrist when needed, and declined such things as nitroglycerin as well as formal physical therapy. Patient will come back again in 4 weeks and at that time we will either consider PRP injection versus possible physical therapy.  Spent  25 minutes with patient face-to-face and had greater than 50% of counseling including as described above in assessment and plan.

## 2015-10-09 ENCOUNTER — Ambulatory Visit (INDEPENDENT_AMBULATORY_CARE_PROVIDER_SITE_OTHER): Payer: 59 | Admitting: Family Medicine

## 2015-10-09 ENCOUNTER — Encounter: Payer: Self-pay | Admitting: Family Medicine

## 2015-10-09 ENCOUNTER — Other Ambulatory Visit (INDEPENDENT_AMBULATORY_CARE_PROVIDER_SITE_OTHER): Payer: 59

## 2015-10-09 VITALS — BP 128/84 | HR 79 | Ht 67.0 in | Wt 199.0 lb

## 2015-10-09 DIAGNOSIS — M25522 Pain in left elbow: Secondary | ICD-10-CM

## 2015-10-09 DIAGNOSIS — S42433A Displaced fracture (avulsion) of lateral epicondyle of unspecified humerus, initial encounter for closed fracture: Secondary | ICD-10-CM

## 2015-10-09 NOTE — Assessment & Plan Note (Signed)
Patient is healing fairly well at this time. Encourage patient to actually do more of a phase II exercises but avoid certain strengthening. Would like her to wear the wrist brace at night for another 2 weeks. We discussed continuing the icing regimen. No changes in her medication at this time. Patient will come back and see me again in 4-6 weeks for further evaluation. Hopefully at that time her tendon should be somewhere between 75 and 100% healed.

## 2015-10-09 NOTE — Patient Instructions (Signed)
Good to see you again Continue the exercises, limit the painful exercises  Continue icing the area a few times a day Stay on the plan, exercises and wearing the brace  Come back in 4-6weeks

## 2015-10-09 NOTE — Progress Notes (Signed)
[facility] [address], [place] [address] Phone: [phone] Subjective:     CC: Elbow pain, left  [id number] Kathy Huerta is a 54 y.o. female coming in with complaint of left elbow pain after fall. Patient was seen previously and did have an avulsion fracture of the lateral epicondylar region with a full-thickness tear of the extensor common tendon. Patient elected try conservative therapy including bracing of the wrist and avoiding any significant lifting. Patient was given anti-inflammatories and high-dose vitamin D supplementation. Patient states she is improving at this time. Patient states that the daily pain is significantly better. Not wearing the wrist brace is not doing the exercises as regularly because the strength exercises does seem to give her some discomfort. Patient denies any numbness or tingling. Rates the severity of pain as 4 out of 10. Patient states that any lifting with her left pain can be difficult.  Patient did give history of possible fasciculations of her neck. Patient states it always happens when she is on an airplane. When discussing different possible triggers patient does state that she usually does not drink any fluid secondary to not wearing to get up to use the bathroom.    Past medical history, social, surgical and family history all reviewed in electronic medical record.   Review of Systems: No headache, visual changes, nausea, vomiting, diarrhea, constipation, dizziness, abdominal pain, skin rash, fevers, chills, night sweats, weight loss, swollen lymph nodes, body aches, joint swelling, muscle aches, chest pain, shortness of breath, mood changes.   Objective Blood pressure 128/84, pulse 79, height 5\' 7"  (1.702 m), weight 199 lb (90.266 kg), last menstrual period 09/23/2011, SpO2 94 %.  General: No apparent distress alert and oriented x3 mood and affect normal, dressed appropriately.  HEENT: Pupils equal,  extraocular movements intact  Respiratory: Patient's speak in full sentences and does not appear short of breath  Cardiovascular: No lower extremity edema, non tender, no erythema  Skin: Warm dry intact with no signs of infection or rash on extremities or on axial skeleton.  Abdomen: Soft nontender  Neuro: Cranial nerves II through XII are intact, neurovascularly intact in all extremities with 2+ DTRs and 2+ pulses.  Lymph: No lymphadenopathy of posterior or anterior cervical chain or axillae bilaterally.  Gait normal with good balance and coordination.  MSK:  Non tender with full range of motion and good stability and symmetric strength and tone of shoulders,  wrist, hip, knee and ankles bilaterally.  Elbow: Left Unremarkable to inspection. Range of motion full pronation, supination, flexion, extension. Strength is full to all of the above directions Stable to varus, valgus stress. Negative moving valgus stress test. Minimal tenderness to palpation over the lateral epicondylar region Ulnar nerve does not sublux. Negative cubital tunnel Tinel's. Contralateral elbow unremarkable  Musculoskeletal ultrasound was performed and interpreted by Charlann Boxer D.O.   Elbow: Left Lateral epicondyle and common extensor tendon origin visualized.. Patient's avulsion fracture seems to be near healed at this time and does have some mild bone reduction still noted. No significant retraction at this time of the tendon. Patient still has approximately 50% tear on the articular side of the extensor common tendon. Increasing Doppler flow and scar tissue formation noted. Radial head unremarkable and located in annular ligament Medial epicondyle and common flexor tendon origin visualized.  No edema, effusions, or avulsions seen. Ulnar nerve in cubital tunnel unremarkable. Olecranon and triceps insertion visualized and unremarkable without edema, effusion, or avulsion.  No signs olecranon bursitis. Power doppler  signal normal.  IMPRESSION:  Near healed avulsion fracture but continued tear of the extensor common tendon but healed approximately 50%.    Impression and Recommendations:     This case required medical decision making of moderate complexity.

## 2015-10-09 NOTE — Progress Notes (Signed)
Pre visit review using our clinic review tool, if applicable. No additional management support is needed unless otherwise documented below in the visit note. 

## 2015-10-11 ENCOUNTER — Other Ambulatory Visit: Payer: Self-pay | Admitting: Family Medicine

## 2015-10-25 ENCOUNTER — Encounter: Payer: Self-pay | Admitting: Family Medicine

## 2015-10-30 ENCOUNTER — Other Ambulatory Visit: Payer: 59

## 2015-11-06 ENCOUNTER — Encounter: Payer: Self-pay | Admitting: Family Medicine

## 2015-11-06 ENCOUNTER — Ambulatory Visit (INDEPENDENT_AMBULATORY_CARE_PROVIDER_SITE_OTHER): Payer: 59 | Admitting: Family Medicine

## 2015-11-06 VITALS — BP 128/84 | HR 86 | Ht 67.0 in | Wt 202.0 lb

## 2015-11-06 DIAGNOSIS — S42433A Displaced fracture (avulsion) of lateral epicondyle of unspecified humerus, initial encounter for closed fracture: Secondary | ICD-10-CM

## 2015-11-06 MED ORDER — VITAMIN D (ERGOCALCIFEROL) 1.25 MG (50000 UNIT) PO CAPS: 50000.0000 [IU] | ORAL_CAPSULE | ORAL | Status: DC

## 2015-11-06 NOTE — Patient Instructions (Signed)
Good to see you Ice is your friend Stay active Wear the brace at night See me again in 6 weeks.

## 2015-11-06 NOTE — Assessment & Plan Note (Signed)
Avulsion fracture seems to be doing very well at this time and near completely healed. Patient continues to have a partial tear of the extensor common tendon. Do not know if it will heal on its own. We discussed the possibility of a dense imaging but patient declined. Not stopping her from regular activities but is avoiding certain lifting. Discussed the possibility of nitroglycerin which patient declined. Patient may need possible injection including PRP. Patient would like to continue with conservative therapy for another 6 weeks.

## 2015-11-06 NOTE — Progress Notes (Signed)
  [facility] [address], [place] [address] Phone: [phone] Subjective:     CC: Elbow pain, left follow up  [id number] Kathy Huerta is a 54 y.o. female coming in with complaint of left elbow pain after fall. Patient was seen previously and did have an avulsion fracture of the lateral epicondylar region with a full-thickness tear of the extensor common tendon. Patient elected try conservative therapy including bracing of the wrist and avoiding any significant lifting. Patient was given anti-inflammatories and high-dose vitamin D supplementation. Patient states she is improving at this time. Patient states that the daily pain is significantly better. Wearing the wrist brace more on a regular basis now at night. Denies any worsening symptoms. Seems to be slowly improving. Continues to monitor and avoid significant lifting.  Patient did give history of possible fasciculations of her neck. Patient states it always happens when she is on an airplane. When discussing different possible triggers patient does state that she usually does not drink any fluid secondary to not wearing to get up to use the bathroom.    Past medical history, social, surgical and family history all reviewed in electronic medical record.   Review of Systems: No headache, visual changes, nausea, vomiting, diarrhea, constipation, dizziness, abdominal pain, skin rash, fevers, chills, night sweats, weight loss, swollen lymph nodes, body aches, joint swelling, muscle aches, chest pain, shortness of breath, mood changes.   Objective Blood pressure 128/84, pulse 86, height 5\' 7"  (1.702 m), weight 202 lb (91.627 kg), last menstrual period 09/23/2011, SpO2 98 %.  General: No apparent distress alert and oriented x3 mood and affect normal, dressed appropriately.  HEENT: Pupils equal, extraocular movements intact  Respiratory: Patient's speak in full sentences and does not appear short of  breath  Cardiovascular: No lower extremity edema, non tender, no erythema  Skin: Warm dry intact with no signs of infection or rash on extremities or on axial skeleton.  Abdomen: Soft nontender  Neuro: Cranial nerves II through XII are intact, neurovascularly intact in all extremities with 2+ DTRs and 2+ pulses.  Lymph: No lymphadenopathy of posterior or anterior cervical chain or axillae bilaterally.  Gait normal with good balance and coordination.  MSK:  Non tender with full range of motion and good stability and symmetric strength and tone of shoulders,  wrist, hip, knee and ankles bilaterally.  Elbow: Left Unremarkable to inspection. Range of motion full pronation, supination, flexion, extension. Strength is full to all of the above directions Stable to varus, valgus stress. Negative moving valgus stress test. Continued tenderness over the lateral epicondylar region Ulnar nerve does not sublux. Negative cubital tunnel Tinel's. Contralateral elbow unremarkable  Musculoskeletal ultrasound was performed and interpreted by Charlann Boxer D.O.   Elbow: Left Lateral epicondyle and common extensor tendon origin visualized.Marland Kitchen alvulsion healed with good callus formation. Patient continues to have a intersubstance tear of approximately 35% of the extensor common tendon. Continues to show some healing. Radial head unremarkable and located in annular ligament Medial epicondyle and common flexor tendon origin visualized.  No edema, effusions, or avulsions seen. Ulnar nerve in cubital tunnel unremarkable. Olecranon and triceps insertion visualized and unremarkable without edema, effusion, or avulsion.  No signs olecranon bursitis. Power doppler signal normal.  IMPRESSION: Healed avulsion fracture with 35% tear still remaining of the extensor common.   Impression and Recommendations:     This case required medical decision making of moderate complexity.

## 2015-11-06 NOTE — Progress Notes (Signed)
Pre visit review using our clinic review tool, if applicable. No additional management support is needed unless otherwise documented below in the visit note. 

## 2015-11-09 ENCOUNTER — Encounter: Payer: Self-pay | Admitting: Family Medicine

## 2015-11-09 ENCOUNTER — Ambulatory Visit (INDEPENDENT_AMBULATORY_CARE_PROVIDER_SITE_OTHER): Payer: 59 | Admitting: Family Medicine

## 2015-11-09 VITALS — BP 110/84 | HR 100 | Temp 98.0°F | Ht 67.0 in | Wt 201.0 lb

## 2015-11-09 DIAGNOSIS — R000 Tachycardia, unspecified: Secondary | ICD-10-CM | POA: Diagnosis not present

## 2015-11-09 DIAGNOSIS — Z23 Encounter for immunization: Secondary | ICD-10-CM | POA: Diagnosis not present

## 2015-11-09 DIAGNOSIS — I10 Essential (primary) hypertension: Secondary | ICD-10-CM

## 2015-11-09 DIAGNOSIS — K5909 Other constipation: Secondary | ICD-10-CM

## 2015-11-09 DIAGNOSIS — E559 Vitamin D deficiency, unspecified: Secondary | ICD-10-CM

## 2015-11-09 DIAGNOSIS — K5900 Constipation, unspecified: Secondary | ICD-10-CM

## 2015-11-09 DIAGNOSIS — K219 Gastro-esophageal reflux disease without esophagitis: Secondary | ICD-10-CM

## 2015-11-09 DIAGNOSIS — E785 Hyperlipidemia, unspecified: Secondary | ICD-10-CM

## 2015-11-09 LAB — TSH: TSH: 3.69 u[IU]/mL (ref 0.35–4.50)

## 2015-11-09 LAB — LIPID PANEL
CHOL/HDL RATIO: 3
CHOLESTEROL: 194 mg/dL (ref 0–200)
HDL: 68.4 mg/dL (ref >39.00)
NonHDL: 125.47
Triglycerides: 242 mg/dL — ABNORMAL HIGH (ref 0.0–149.0)
VLDL: 48.4 mg/dL — AB (ref 0.0–40.0)

## 2015-11-09 LAB — LDL CHOLESTEROL, DIRECT: LDL DIRECT: 101 mg/dL

## 2015-11-09 NOTE — Progress Notes (Signed)
Pre visit review using our clinic review tool, if applicable. No additional management support is needed unless otherwise documented below in the visit note. 

## 2015-11-09 NOTE — Patient Instructions (Addendum)
ICool for not flashes at [employer] Start a fiber supplement twice daily, Psyllium or Benefiber Consider alternating NOW probiotic with Digestive Advantage or [facility] Imodium prn Minimize starchy carbohydrates   Irritable Bowel Syndrome, Adult Irritable bowel syndrome (IBS) is not one specific disease. It is a group of symptoms that affects the organs responsible for digestion (gastrointestinal or GI tract).  To regulate how your GI tract works, your body sends signals back and forth between your intestines and your brain. If you have IBS, there may be a problem with these signals. As a result, your GI tract does not function normally. Your intestines may become more sensitive and overreact to certain things. This is especially true when you eat certain foods or when you are under stress.  There are four types of IBS. These may be determined based on the consistency of your stool:   IBS with diarrhea.   IBS with constipation.   Mixed IBS.   Unsubtyped IBS.  It is important to know which type of IBS you have. Some treatments are more likely to be helpful for certain types of IBS.  CAUSES  The exact cause of IBS is not known. RISK FACTORS You may have a higher risk of IBS if:  You are a woman.  You are younger than 55 years old.  You have a family history of IBS.  You have mental health problems.  You have had bacterial infection of your GI tract. SIGNS AND SYMPTOMS  Symptoms of IBS vary from person to person. The main symptom is abdominal pain or discomfort. Additional symptoms usually include one or more of the following:   Diarrhea, constipation, or both.   Abdominal swelling or bloating.   Feeling full or sick after eating a small or regular-size meal.   Frequent gas.   Mucus in the stool.   A feeling of having more stool left after a bowel movement.  Symptoms tend to come and go. They may be associated with stress, psychiatric conditions, or  nothing at all.  DIAGNOSIS  There is no specific test to diagnose IBS. Your health care provider will make a diagnosis based on a physical exam, medical history, and your symptoms. You may have other tests to rule out other conditions that may be causing your symptoms. These may include:   Blood tests.   X-rays.   CT scan.  Endoscopy and colonoscopy. This is a test in which your GI tract is viewed with a long, thin, flexible tube. TREATMENT There is no cure for IBS, but treatment can help relieve symptoms. IBS treatment often includes:   Changes to your diet, such as:  Eating more fiber.  Avoiding foods that cause symptoms.  Drinking more water.  Eating regular, medium-sized portioned meals.  Medicines. These may include:  Fiber supplements if you have constipation.  Medicine to control diarrhea (antidiarrheal medicines).  Medicine to help control muscle spasms in your GI tract (antispasmodic medicines).  Medicines to help with any mental health issues, such as antidepressants or tranquilizers.  Therapy.  Talk therapy may help with anxiety, depression, or other mental health issues that can make IBS symptoms worse.  Stress reduction.  Managing your stress can help keep symptoms under control. HOME CARE INSTRUCTIONS   Take medicines only as directed by your health care provider.  Eat a healthy diet.  Avoid foods and drinks with added sugar.  Include more whole grains, fruits, and vegetables gradually into your diet. This may be especially helpful  if you have IBS with constipation.  Avoid any foods and drinks that make your symptoms worse. These may include dairy products and caffeinated or carbonated drinks.  Do not eat large meals.  Drink enough fluid to keep your urine clear or pale yellow.  Exercise regularly. Ask your health care provider for recommendations of good activities for you.  Keep all follow-up visits as directed by your health care provider.  This is important. SEEK MEDICAL CARE IF:   You have constant pain.  You have trouble or pain with swallowing.  You have worsening diarrhea. SEEK IMMEDIATE MEDICAL CARE IF:   You have severe and worsening abdominal pain.   You have diarrhea and:   You have a rash, stiff neck, or severe headache.   You are irritable, sleepy, or difficult to awaken.   You are weak, dizzy, or extremely thirsty.   You have bright red blood in your stool or you have black tarry stools.   You have unusual abdominal swelling that is painful.   You vomit continuously.   You vomit blood (hematemesis).   You have both abdominal pain and a fever.    This information is not intended to replace advice given to you by your health care provider. Make sure you discuss any questions you have with your health care provider.   Document Released: 12/08/2005 Document Revised: 12/29/2014 Document Reviewed: 08/25/2014 Elsevier Interactive Patient Education [employer].

## 2015-11-12 ENCOUNTER — Other Ambulatory Visit: Payer: Self-pay | Admitting: Family Medicine

## 2015-11-12 MED ORDER — ATORVASTATIN CALCIUM 20 MG PO TABS: 20.0000 mg | ORAL_TABLET | Freq: Every day | ORAL | Status: DC

## 2015-11-18 ENCOUNTER — Encounter: Payer: Self-pay | Admitting: Family Medicine

## 2015-11-18 DIAGNOSIS — E559 Vitamin D deficiency, unspecified: Secondary | ICD-10-CM

## 2015-11-18 HISTORY — DX: Vitamin D deficiency, unspecified: E55.9

## 2015-11-18 NOTE — Assessment & Plan Note (Signed)
Started on 50000 IU weekly and daily 2000 IU and monitor

## 2015-11-18 NOTE — Assessment & Plan Note (Signed)
Avoid offending foods, start probiotics. Do not eat large meals in late evening and consider raising head of bed. May use Pantoprazole prn

## 2015-11-18 NOTE — Assessment & Plan Note (Signed)
Well controlled, no changes to meds. Encouraged heart healthy diet such as the DASH diet and exercise as tolerated.  °

## 2015-11-18 NOTE — Progress Notes (Signed)
Subjective:    Patient ID: Kathy Huerta, female    DOB: 25-Feb-1961, 54 y.o.   MRN: [id number]  Chief Complaint  Patient presents with  . Follow-up    HPI Patient is in today for follow up. Is doing fairly well today. Continues to follow with ortho for her chronic pain and manages to stay fairly active. No recent acute illness. Did have trouble with watery diarrhea on Sertraline to 200 mg daily but with 100 mg that has resolved. She does still note anhedonia but no suicidal ideation. Denies CP/palp/SOB/HA/congestion/fevers/GI or GU c/o. Taking meds as prescribed  Past Medical History  Diagnosis Date  . HTN (hypertension) 2002  . Depression   . Heartburn   . Basal cell carcinoma 11/17/11    Dr. Sammuel Hines  . Headache(784.0) 01/29/2013  . DDD (degenerative disc disease), thoracolumbar     on disability  . DDD (degenerative disc disease), cervical     s/p fusion  . Pancreatitis 04/24/2011  . Cutaneous skin tags 07/24/2013  . Pneumonia 07/24/2013  . Pain in joint, shoulder region 11/20/2013  . Tachycardia 08/12/2014  . Obesity   . Anxiety   . Colon polyp   . Hyperlipemia 08/2014  . Vitamin D deficiency 11/18/2015    Past Surgical History  Procedure Laterality Date  . Anterior fusion cervical spine  2008    C6-7  Dr Saintclair Halsted  . Posterior fusion cervical spine  2011    C6-7  . Cholecystectomy N/A 02/27/2013    Procedure: LAPAROSCOPIC CHOLECYSTECTOMY WITH INTRAOPERATIVE CHOLANGIOGRAM;  Surgeon: Adin Hector, MD;  Location: [facility];  Service: General;  Laterality: N/A;    Family History  Problem Relation Age of Onset  . Hypertension Mother   . Hyperlipidemia Mother   . Other Father     DDD, colectomy,cardiac stent, afib  . Hypertension Brother   . Colon cancer Neg Hx   . Stomach cancer Neg Hx   . Colon polyps Father   . Diabetes Maternal Grandmother   . Kidney disease Neg Hx     Social History   Social History  . Marital Status: Married    Spouse Name: N/A  . Number of  Children: 0  . Years of Education: N/A   Occupational History  . Disabled    Social History Main Topics  . Smoking status: Never Smoker   . Smokeless tobacco: Never Used  . Alcohol Use: 3.0 oz/week    5 Cans of beer per week  . Drug Use: No  . Sexual Activity: Not on file   Other Topics Concern  . Not on file   Social History Narrative   Reviewed history and no changes required.   Occupation: Disabled,  has worked in the past for a [employer] (medical))   Married   Alcohol use-yes- every weekend 4-5 beers.   Regular exercise-no   Smoking Status:  quit   Caffeine use/day:  none   Does Patient Exercise:  no          Outpatient Prescriptions Prior to Visit  Medication Sig Dispense Refill  . cyclobenzaprine (FLEXERIL) 10 MG tablet Take 10 mg by mouth daily.    . meloxicam (MOBIC) 7.5 MG tablet Take 7.5 mg by mouth as needed.     Marland Kitchen olmesartan (BENICAR) 20 MG tablet Take 0.5 tablets (10 mg total) by mouth daily. 45 tablet 3  . oxyCODONE-acetaminophen (PERCOCET/ROXICET) 5-325 MG per tablet Take 1 tablet by mouth every 4 (four) hours as  needed for pain.    . pantoprazole (PROTONIX) 40 MG tablet TAKE 1 TABLET (40 MG TOTAL) BY MOUTH DAILY. 30 tablet 3  . pilocarpine (SALAGEN) 5 MG tablet Take 5 mg by mouth 3 (three) times daily. Pt takes 2 tablets, 5 mg, by mouth, three times a day    . propranolol (INNOPRAN XL) 80 MG 24 hr capsule Take 1 capsule (80 mg total) by mouth at bedtime. 90 capsule 3  . sertraline (ZOLOFT) 100 MG tablet Take 1.5 tablets (150 mg total) by mouth daily. TAKE 1 TABLET (100 MG TOTAL) BY MOUTH DAILY. 135 tablet 3  . tiZANidine (ZANAFLEX) 2 MG tablet Take 2 mg by mouth every 8 (eight) hours as needed.    . Vitamin D, Ergocalciferol, (DRISDOL) 50000 UNITS CAPS capsule Take 1 capsule (50,000 Units total) by mouth every 7 (seven) days. 12 capsule 0  . atorvastatin (LIPITOR) 10 MG tablet TAKE 1 TABLET (10 MG TOTAL) BY MOUTH DAILY. 30 tablet 5   No  facility-administered medications prior to visit.    Allergies  Allergen Reactions  . Amoxicillin-Pot Clavulanate     REACTION: Throat swelling.  . Azithromycin     REACTION: Throat Swelling  . Cefazolin     REACTION: Throat Swelling  . Moxifloxacin     REACTION: Throat Swelling  . Penicillins     REACTION: throat swelling  . Sulfonamide Derivatives     Review of Systems  Constitutional: Negative for fever and malaise/fatigue.  HENT: Negative for congestion.   Eyes: Negative for discharge.  Respiratory: Negative for shortness of breath.   Cardiovascular: Negative for chest pain, palpitations and leg swelling.  Gastrointestinal: Negative for nausea and abdominal pain.  Genitourinary: Negative for dysuria.  Musculoskeletal: Positive for back pain, joint pain and neck pain. Negative for falls.  Skin: Negative for rash.  Neurological: Negative for loss of consciousness and headaches.  Endo/Heme/Allergies: Negative for environmental allergies.  Psychiatric/Behavioral: Negative for depression. The patient is not nervous/anxious.        Objective:    Physical Exam  Constitutional: She is oriented to person, place, and time. She appears well-developed and well-nourished. No distress.  HENT:  Head: Normocephalic and atraumatic.  Nose: Nose normal.  Eyes: Right eye exhibits no discharge. Left eye exhibits no discharge.  Neck: Normal range of motion. Neck supple.  Cardiovascular: Normal rate and regular rhythm.   No murmur heard. Pulmonary/Chest: Effort normal and breath sounds normal.  Abdominal: Soft. Bowel sounds are normal. There is no tenderness.  Musculoskeletal: She exhibits no edema.  Neurological: She is alert and oriented to person, place, and time.  Skin: Skin is warm and dry.  Psychiatric: She has a normal mood and affect.  Nursing note and vitals reviewed.   BP 110/84 mmHg  Pulse 100  Temp(Src) 98 F (36.7 C) (Oral)  Ht 5\' 7"  (1.702 m)  Wt 201 lb (91.173  kg)  BMI 31.47 kg/m2  SpO2 95%  LMP 09/23/2011 Wt Readings from Last 3 Encounters:  11/09/15 201 lb (91.173 kg)  11/06/15 202 lb (91.627 kg)  10/09/15 199 lb (90.266 kg)     Lab Results  Component Value Date   WBC 6.2 03/30/2015   HGB 13.8 03/30/2015   HCT 40.1 03/30/2015   PLT 339.0 03/30/2015   GLUCOSE 96 03/30/2015   CHOL 194 11/09/2015   TRIG 242.0* 11/09/2015   HDL 68.40 11/09/2015   LDLDIRECT 101.0 11/09/2015   LDLCALC 89 03/30/2015   ALT 23 03/30/2015  AST 25 03/30/2015   NA 138 03/30/2015   K 3.9 03/30/2015   CL 104 03/30/2015   CREATININE 0.56 03/30/2015   BUN 10 03/30/2015   CO2 25 03/30/2015   TSH 3.69 11/09/2015    Lab Results  Component Value Date   TSH 3.69 11/09/2015   Lab Results  Component Value Date   WBC 6.2 03/30/2015   HGB 13.8 03/30/2015   HCT 40.1 03/30/2015   MCV 88.9 03/30/2015   PLT 339.0 03/30/2015   Lab Results  Component Value Date   NA 138 03/30/2015   K 3.9 03/30/2015   CO2 25 03/30/2015   GLUCOSE 96 03/30/2015   BUN 10 03/30/2015   CREATININE 0.56 03/30/2015   BILITOT 0.5 03/30/2015   ALKPHOS 88 03/30/2015   AST 25 03/30/2015   ALT 23 03/30/2015   PROT 7.6 03/30/2015   ALBUMIN 4.3 03/30/2015   CALCIUM 9.4 03/30/2015   GFR 119.45 03/30/2015   Lab Results  Component Value Date   CHOL 194 11/09/2015   Lab Results  Component Value Date   HDL 68.40 11/09/2015   Lab Results  Component Value Date   LDLCALC 89 03/30/2015   Lab Results  Component Value Date   TRIG 242.0* 11/09/2015   Lab Results  Component Value Date   CHOLHDL 3 11/09/2015   No results found for: HGBA1C     Assessment & Plan:   Problem List Items Addressed This Visit    Constipation, chronic    Encouraged increased hydration and fiber in diet. Daily probiotics. If bowels not moving can use MOM 2 tbls po in 4 oz of warm prune juice by mouth every 2-3 days. If no results then repeat in 4 hours with  Dulcolax suppository pr, may repeat  again in 4 more hours as needed. Seek care if symptoms worsen. Consider daily Miralax and/or Dulcolax if symptoms persist. Did experience some diarrhea with increased Sertraline but that has resolved with decrease dose      GERD (gastroesophageal reflux disease)    Avoid offending foods, start probiotics. Do not eat large meals in late evening and consider raising head of bed. May use Pantoprazole prn      Hyperlipidemia    Tolerating statin, encouraged heart healthy diet, avoid trans fats, minimize simple carbs and saturated fats. Increase exercise as tolerated      Relevant Orders   Lipid panel (Completed)   TSH (Completed)   Hypertension    Well controlled, no changes to meds. Encouraged heart healthy diet such as the DASH diet and exercise as tolerated.       Relevant Orders   Lipid panel (Completed)   TSH (Completed)   Tachycardia   Relevant Orders   Lipid panel (Completed)   TSH (Completed)   Vitamin D deficiency    Started on 50000 IU weekly and daily 2000 IU and monitor       Other Visit Diagnoses    Encounter for immunization    -  Primary       I am having Ms. Flesch maintain her tiZANidine, meloxicam, cyclobenzaprine, oxyCODONE-acetaminophen, pilocarpine, olmesartan, sertraline, pantoprazole, propranolol, and Vitamin D (Ergocalciferol).  No orders of the defined types were placed in this encounter.     Penni Homans, MD

## 2015-11-18 NOTE — Assessment & Plan Note (Signed)
Tolerating statin, encouraged heart healthy diet, avoid trans fats, minimize simple carbs and saturated fats. Increase exercise as tolerated 

## 2015-11-18 NOTE — Assessment & Plan Note (Addendum)
Encouraged increased hydration and fiber in diet. Daily probiotics. If bowels not moving can use MOM 2 tbls po in 4 oz of warm prune juice by mouth every 2-3 days. If no results then repeat in 4 hours with  Dulcolax suppository pr, may repeat again in 4 more hours as needed. Seek care if symptoms worsen. Consider daily Miralax and/or Dulcolax if symptoms persist. Did experience some diarrhea with increased Sertraline but that has resolved with decrease dose

## 2015-12-11 ENCOUNTER — Other Ambulatory Visit: Payer: Self-pay | Admitting: Family Medicine

## 2015-12-11 DIAGNOSIS — E785 Hyperlipidemia, unspecified: Secondary | ICD-10-CM

## 2015-12-11 DIAGNOSIS — I10 Essential (primary) hypertension: Secondary | ICD-10-CM

## 2015-12-11 MED ORDER — OLMESARTAN MEDOXOMIL 20 MG PO TABS: 10.0000 mg | ORAL_TABLET | Freq: Every day | ORAL | Status: DC

## 2015-12-18 ENCOUNTER — Other Ambulatory Visit: Payer: Self-pay | Admitting: *Deleted

## 2015-12-18 MED ORDER — PANTOPRAZOLE SODIUM 40 MG PO TBEC: DELAYED_RELEASE_TABLET | ORAL | Status: DC

## 2015-12-25 ENCOUNTER — Encounter: Payer: Self-pay | Admitting: Family Medicine

## 2015-12-25 ENCOUNTER — Ambulatory Visit (INDEPENDENT_AMBULATORY_CARE_PROVIDER_SITE_OTHER): Payer: 59 | Admitting: Family Medicine

## 2015-12-25 VITALS — BP 116/80

## 2015-12-25 DIAGNOSIS — S42433A Displaced fracture (avulsion) of lateral epicondyle of unspecified humerus, initial encounter for closed fracture: Secondary | ICD-10-CM | POA: Diagnosis not present

## 2015-12-25 NOTE — Patient Instructions (Signed)
Verbal instructions given

## 2015-12-25 NOTE — Assessment & Plan Note (Signed)
Patient is made remarkable recovery since last visit. Patient is doing well at this time. We discussed icing regimen and continuing the brace at night if necessary. Patient will come back and see me again on an as-needed basis.

## 2015-12-25 NOTE — Progress Notes (Signed)
  [facility] [address] [place], [place] [address] Phone: [phone] Subjective:     CC: Elbow pain, left follow up  [id number] Kathy Huerta is a 55 y.o. female coming in with complaint of left elbow pain after fall. Patient was seen previously and did have an avulsion fracture of the lateral epicondylar region with a full-thickness tear of the extensor common tendon. Patient elected try conservative therapy including bracing of the wrist and avoiding any significant lifting. Patient was given anti-inflammatories and high-dose vitamin D supplementation. Patient states she is improving at this time. Patient states that the daily pain is significantly better. Wearing the wrist brace at night. Patient states that the pain is very minimal overall and states that she is approximately 95% compared to the contralateral side. Patient has been fairly happy with the results. Patient states that if this is the best she gets she would be happy with the results.     Past medical history, social, surgical and family history all reviewed in electronic medical record.   Review of Systems: No headache, visual changes, nausea, vomiting, diarrhea, constipation, dizziness, abdominal pain, skin rash, fevers, chills, night sweats, weight loss, swollen lymph nodes, body aches, joint swelling, muscle aches, chest pain, shortness of breath, mood changes.   Objective Blood pressure 116/80, last menstrual period 09/23/2011.  General: No apparent distress alert and oriented x3 mood and affect normal, dressed appropriately.  HEENT: Pupils equal, extraocular movements intact  Respiratory: Patient's speak in full sentences and does not appear short of breath  Cardiovascular: No lower extremity edema, non tender, no erythema  Skin: Warm dry intact with no signs of infection or rash on extremities or on axial skeleton.  Abdomen: Soft nontender  Neuro: Cranial nerves II through XII are  intact, neurovascularly intact in all extremities with 2+ DTRs and 2+ pulses.  Lymph: No lymphadenopathy of posterior or anterior cervical chain or axillae bilaterally.  Gait normal with good balance and coordination.  MSK:  Non tender with full range of motion and good stability and symmetric strength and tone of shoulders,  wrist, hip, knee and ankles bilaterally.  Elbow: Left Unremarkable to inspection. Range of motion full pronation, supination, flexion, extension. Strength is full to all of the above directions Stable to varus, valgus stress. Negative moving valgus stress test. I will tenderness over the lateral epicondylar region Ulnar nerve does not sublux. Negative cubital tunnel Tinel's. Contralateral elbow unremarkable     Impression and Recommendations:     This case required medical decision making of moderate complexity.

## 2016-01-10 ENCOUNTER — Other Ambulatory Visit: Payer: Self-pay | Admitting: *Deleted

## 2016-01-10 MED ORDER — VITAMIN D (ERGOCALCIFEROL) 1.25 MG (50000 UNIT) PO CAPS: 50000.0000 [IU] | ORAL_CAPSULE | ORAL | Status: DC

## 2016-01-10 NOTE — Telephone Encounter (Signed)
Refill done.  

## 2016-02-19 ENCOUNTER — Other Ambulatory Visit: Payer: Self-pay | Admitting: Physician Assistant

## 2016-02-19 ENCOUNTER — Telehealth: Payer: Self-pay | Admitting: Physician Assistant

## 2016-02-19 ENCOUNTER — Telehealth: Payer: 59 | Admitting: Family

## 2016-02-19 DIAGNOSIS — J069 Acute upper respiratory infection, unspecified: Secondary | ICD-10-CM

## 2016-02-19 MED ORDER — OSELTAMIVIR PHOSPHATE 75 MG PO CAPS: 75.0000 mg | ORAL_CAPSULE | Freq: Two times a day (BID) | ORAL | Status: DC

## 2016-02-19 MED FILL — OSELTAMIVIR PHOS 75 MG CAP: 75 | 5 days supply | Qty: 10 | Fill #0

## 2016-02-19 NOTE — Progress Notes (Signed)

## 2016-02-19 NOTE — Telephone Encounter (Signed)
Husband presented with flu-like symptoms and tested positive for Flu A. PAtient having same symptoms since last night. Has sent a e-visit but the provider was felt symptoms were not flu related. Giving this new info and husband + flu, agree to send in Tamiflu.

## 2016-03-11 ENCOUNTER — Ambulatory Visit: Payer: 59 | Admitting: Family Medicine

## 2016-03-17 ENCOUNTER — Other Ambulatory Visit: Payer: Self-pay | Admitting: Family Medicine

## 2016-03-17 MED ORDER — ATORVASTATIN CALCIUM 20 MG PO TABS: 20.0000 mg | ORAL_TABLET | Freq: Every day | ORAL | Status: DC

## 2016-03-20 ENCOUNTER — Other Ambulatory Visit: Payer: Self-pay | Admitting: Family Medicine

## 2016-03-20 MED ORDER — ATORVASTATIN CALCIUM 20 MG PO TABS: 20.0000 mg | ORAL_TABLET | Freq: Every day | ORAL | Status: DC

## 2016-04-01 ENCOUNTER — Other Ambulatory Visit: Payer: Self-pay

## 2016-04-01 DIAGNOSIS — Z1231 Encounter for screening mammogram for malignant neoplasm of breast: Secondary | ICD-10-CM

## 2016-04-08 ENCOUNTER — Other Ambulatory Visit: Payer: Self-pay | Admitting: Obstetrics and Gynecology

## 2016-04-08 ENCOUNTER — Other Ambulatory Visit (HOSPITAL_COMMUNITY)
Admission: RE | Admit: 2016-04-08 | Discharge: 2016-04-08 | Disposition: A | Discharge status: Home / Self Care | Payer: 59 | Source: Ambulatory Visit | Attending: Obstetrics and Gynecology | Admitting: Obstetrics and Gynecology

## 2016-04-08 DIAGNOSIS — Z01419 Encounter for gynecological examination (general) (routine) without abnormal findings: Secondary | ICD-10-CM | POA: Diagnosis present

## 2016-04-08 DIAGNOSIS — Z1151 Encounter for screening for human papillomavirus (HPV): Secondary | ICD-10-CM | POA: Insufficient documentation

## 2016-04-09 LAB — CYTOLOGY - PAP

## 2016-04-10 LAB — HM PAP SMEAR: HM Pap smear: NEGATIVE

## 2016-04-15 ENCOUNTER — Other Ambulatory Visit: Payer: Self-pay | Admitting: Internal Medicine

## 2016-04-18 ENCOUNTER — Ambulatory Visit (INDEPENDENT_AMBULATORY_CARE_PROVIDER_SITE_OTHER): Payer: 59

## 2016-04-18 ENCOUNTER — Encounter: Payer: Self-pay | Admitting: Family Medicine

## 2016-04-18 ENCOUNTER — Ambulatory Visit (INDEPENDENT_AMBULATORY_CARE_PROVIDER_SITE_OTHER): Payer: 59 | Admitting: Family Medicine

## 2016-04-18 ENCOUNTER — Ambulatory Visit (INDEPENDENT_AMBULATORY_CARE_PROVIDER_SITE_OTHER)
Admission: RE | Admit: 2016-04-18 | Discharge: 2016-04-18 | Disposition: A | Discharge status: Home / Self Care | Payer: 59 | Source: Ambulatory Visit | Attending: Family Medicine | Admitting: Family Medicine

## 2016-04-18 VITALS — BP 108/72 | HR 98 | Temp 97.9°F | Ht 67.0 in | Wt 203.0 lb

## 2016-04-18 VITALS — BP 114/82 | HR 84 | Wt 201.0 lb

## 2016-04-18 DIAGNOSIS — M353 Polymyalgia rheumatica: Secondary | ICD-10-CM

## 2016-04-18 DIAGNOSIS — M25521 Pain in right elbow: Secondary | ICD-10-CM

## 2016-04-18 DIAGNOSIS — S42433D Displaced fracture (avulsion) of lateral epicondyle of unspecified humerus, subsequent encounter for fracture with routine healing: Secondary | ICD-10-CM | POA: Diagnosis not present

## 2016-04-18 DIAGNOSIS — K219 Gastro-esophageal reflux disease without esophagitis: Secondary | ICD-10-CM | POA: Diagnosis not present

## 2016-04-18 DIAGNOSIS — S42433A Displaced fracture (avulsion) of lateral epicondyle of unspecified humerus, initial encounter for closed fracture: Secondary | ICD-10-CM

## 2016-04-18 DIAGNOSIS — I10 Essential (primary) hypertension: Secondary | ICD-10-CM

## 2016-04-18 DIAGNOSIS — E559 Vitamin D deficiency, unspecified: Secondary | ICD-10-CM

## 2016-04-18 DIAGNOSIS — R000 Tachycardia, unspecified: Secondary | ICD-10-CM

## 2016-04-18 DIAGNOSIS — E785 Hyperlipidemia, unspecified: Secondary | ICD-10-CM

## 2016-04-18 DIAGNOSIS — M5135 Other intervertebral disc degeneration, thoracolumbar region: Secondary | ICD-10-CM

## 2016-04-18 LAB — LIPID PANEL
CHOL/HDL RATIO: 3 ratio (ref <=5.0)
CHOLESTEROL: 186 mg/dL (ref 125–200)
HDL: 62 mg/dL (ref >=46)
LDL Cholesterol: 44 mg/dL (ref <130)
TRIGLYCERIDES: 399 mg/dL — AB (ref <150)
VLDL: 80 mg/dL — AB (ref <30)

## 2016-04-18 LAB — CBC
HCT: 40 % (ref 35.0–45.0)
Hemoglobin: 13.5 g/dL (ref 11.7–15.5)
MCH: 30.9 pg (ref 27.0–33.0)
MCHC: 33.8 g/dL (ref 32.0–36.0)
MCV: 91.5 fL (ref 80.0–100.0)
MPV: 8.9 fL (ref 7.5–12.5)
PLATELETS: 278 10*3/uL (ref 140–400)
RBC: 4.37 MIL/uL (ref 3.80–5.10)
RDW: 13.5 % (ref 11.0–15.0)
WBC: 8.1 10*3/uL (ref 3.8–10.8)

## 2016-04-18 LAB — COMPREHENSIVE METABOLIC PANEL
ALK PHOS: 78 U/L (ref 33–130)
ALT: 23 U/L (ref 6–29)
AST: 18 U/L (ref 10–35)
Albumin: 4.4 g/dL (ref 3.6–5.1)
BUN: 16 mg/dL (ref 7–25)
CALCIUM: 8.9 mg/dL (ref 8.6–10.4)
CO2: 26 mmol/L (ref 20–31)
Chloride: 101 mmol/L (ref 98–110)
Creat: 0.69 mg/dL (ref 0.50–1.05)
GLUCOSE: 92 mg/dL (ref 65–99)
POTASSIUM: 4.5 mmol/L (ref 3.5–5.3)
Sodium: 138 mmol/L (ref 135–146)
Total Bilirubin: 0.4 mg/dL (ref 0.2–1.2)
Total Protein: 7.3 g/dL (ref 6.1–8.1)

## 2016-04-18 LAB — TSH: TSH: 3.56 mIU/L

## 2016-04-18 MED ORDER — MELOXICAM 15 MG PO TABS: 15.0000 mg | ORAL_TABLET | Freq: Every day | ORAL | Status: DC

## 2016-04-18 NOTE — Progress Notes (Signed)
Pre visit review using our clinic review tool, if applicable. No additional management support is needed unless otherwise documented below in the visit note. 

## 2016-04-18 NOTE — Patient Instructions (Addendum)
Sorry for the bad news Xray downstairs today  Ice 20 minutes 2 times daily. Usually after activity and before bed. 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  Turmeric 500mg  twice daily  See me again in 2-3 weeks or follow up with rheumatology because you may need ne medicines.

## 2016-04-18 NOTE — Progress Notes (Signed)
Subjective:    Patient ID: Kathy Huerta, female    DOB: [birth year], 55 y.o.   MRN: [id number]  Chief Complaint  Patient presents with  . Follow-up    4 month    HPI Patient is in today for follow up.  Patient reports that she has a small fracture on the right elbow, whom she is following up with Dr. Tamala Julian about.  Patient reports having diarrhea but stop taking a medicaiton and it as improved since. Has trouble with diffuse ongoing pain over years. Denies CP/palp/SOB/HA/congestion/fevers/GI or GU c/o. Taking meds as prescribed  Past Medical History  Diagnosis Date  . HTN (hypertension) 2002  . Depression   . Heartburn   . Basal cell carcinoma 11/17/11    Dr. Sammuel Hines  . Headache(784.0) 01/29/2013  . DDD (degenerative disc disease), thoracolumbar     on disability  . DDD (degenerative disc disease), cervical     s/p fusion  . Pancreatitis 04/24/2011  . Cutaneous skin tags 07/24/2013  . Pneumonia 07/24/2013  . Pain in joint, shoulder region 11/20/2013  . Tachycardia 08/12/2014  . Obesity   . Anxiety   . Colon polyp   . Hyperlipemia 08/2014  . Vitamin D deficiency 11/18/2015    Past Surgical History  Procedure Laterality Date  . Anterior fusion cervical spine  2008    C6-7  Dr Saintclair Halsted  . Posterior fusion cervical spine  2011    C6-7  . Cholecystectomy N/A 02/27/2013    Procedure: LAPAROSCOPIC CHOLECYSTECTOMY WITH INTRAOPERATIVE CHOLANGIOGRAM;  Surgeon: Adin Hector, MD;  Location: [facility];  Service: General;  Laterality: N/A;    Family History  Problem Relation Age of Onset  . Hypertension Mother   . Hyperlipidemia Mother   . Other Father     DDD, colectomy,cardiac stent, afib  . Hypertension Brother   . Colon cancer Neg Hx   . Stomach cancer Neg Hx   . Colon polyps Father   . Diabetes Maternal Grandmother   . Kidney disease Neg Hx     Social History   Social History  . Marital Status: Married    Spouse Name: N/A  . Number of Children: 0  . Years of Education: N/A     Occupational History  . Disabled    Social History Main Topics  . Smoking status: Never Smoker   . Smokeless tobacco: Never Used  . Alcohol Use: 3.0 oz/week    5 Cans of beer per week  . Drug Use: No  . Sexual Activity: Not on file   Other Topics Concern  . Not on file   Social History Narrative   Reviewed history and no changes required.   Occupation: Disabled,  has worked in the past for a [employer] (medical))   Married   Alcohol use-yes- every weekend 4-5 beers.   Regular exercise-no   Smoking Status:  quit   Caffeine use/day:  none   Does Patient Exercise:  no          Outpatient Prescriptions Prior to Visit  Medication Sig Dispense Refill  . atorvastatin (LIPITOR) 20 MG tablet Take 1 tablet (20 mg total) by mouth daily. 30 tablet 4  . cyclobenzaprine (FLEXERIL) 10 MG tablet Take 10 mg by mouth daily.    . meloxicam (MOBIC) 15 MG tablet Take 1 tablet (15 mg total) by mouth daily. 30 tablet 0  . meloxicam (MOBIC) 7.5 MG tablet Take 7.5 mg by mouth as needed.     Marland Kitchen  olmesartan (BENICAR) 20 MG tablet Take 0.5 tablets (10 mg total) by mouth daily. 45 tablet 3  . oxyCODONE-acetaminophen (PERCOCET/ROXICET) 5-325 MG per tablet Take 1 tablet by mouth every 4 (four) hours as needed for pain.    . pantoprazole (PROTONIX) 40 MG tablet TAKE ONE TABLET BY MOUTH DAILY 30 tablet 2  . pilocarpine (SALAGEN) 5 MG tablet Take 5 mg by mouth 3 (three) times daily. Pt takes 2 tablets, 5 mg, by mouth, three times a day    . propranolol (INNOPRAN XL) 80 MG 24 hr capsule Take 1 capsule (80 mg total) by mouth at bedtime. 90 capsule 3  . sertraline (ZOLOFT) 100 MG tablet Take 1.5 tablets (150 mg total) by mouth daily. TAKE 1 TABLET (100 MG TOTAL) BY MOUTH DAILY. 135 tablet 3  . tiZANidine (ZANAFLEX) 2 MG tablet Take 2 mg by mouth every 8 (eight) hours as needed.    . Vitamin D, Ergocalciferol, (DRISDOL) 50000 units CAPS capsule Take 1 capsule (50,000 Units total) by mouth every 7  (seven) days. 12 capsule 0  . oseltamivir (TAMIFLU) 75 MG capsule Take 1 capsule (75 mg total) by mouth 2 (two) times daily. 10 capsule 0   No facility-administered medications prior to visit.    Allergies  Allergen Reactions  . Amoxicillin-Pot Clavulanate     REACTION: Throat swelling.  . Azithromycin     REACTION: Throat Swelling  . Cefazolin     REACTION: Throat Swelling  . Moxifloxacin     REACTION: Throat Swelling  . Penicillins     REACTION: throat swelling  . Sulfonamide Derivatives     Review of Systems  Constitutional: Negative for fever and malaise/fatigue.  HENT: Negative for congestion.   Eyes: Negative for blurred vision.  Respiratory: Negative for shortness of breath.   Cardiovascular: Negative for chest pain, palpitations and leg swelling.  Gastrointestinal: Negative for nausea, abdominal pain and blood in stool.  Genitourinary: Negative for dysuria and frequency.  Musculoskeletal: Negative for falls.  Skin: Negative for rash.  Neurological: Negative for dizziness, loss of consciousness and headaches.  Endo/Heme/Allergies: Negative for environmental allergies.  Psychiatric/Behavioral: Negative for depression. The patient is not nervous/anxious.        Objective:    Physical Exam  Constitutional: She is oriented to person, place, and time. She appears well-developed and well-nourished. No distress.  HENT:  Head: Normocephalic and atraumatic.  Eyes: Conjunctivae are normal.  Neck: Neck supple. No thyromegaly present.  Cardiovascular: Normal rate, regular rhythm and normal heart sounds.   No murmur heard. Pulmonary/Chest: Effort normal and breath sounds normal. No respiratory distress.  Abdominal: Soft. Bowel sounds are normal. She exhibits no distension and no mass. There is no tenderness.  Musculoskeletal: She exhibits no edema.  Lymphadenopathy:    She has no cervical adenopathy.  Neurological: She is alert and oriented to person, place, and time.    Skin: Skin is warm and dry.  Psychiatric: She has a normal mood and affect. Her behavior is normal.    BP 108/72 mmHg  Pulse 98  Temp(Src) 97.9 F (36.6 C) (Oral)  Ht 5\' 7"  (1.702 m)  Wt 203 lb (92.08 kg)  BMI 31.79 kg/m2  SpO2 97%  LMP 09/23/2011 Wt Readings from Last 3 Encounters:  04/18/16 203 lb (92.08 kg)  04/18/16 201 lb (91.173 kg)  11/09/15 201 lb (91.173 kg)     Lab Results  Component Value Date   WBC 8.1 04/18/2016   HGB 13.5 04/18/2016   HCT  40.0 04/18/2016   PLT 278 04/18/2016   GLUCOSE 92 04/18/2016   CHOL 186 04/18/2016   TRIG 399* 04/18/2016   HDL 62 04/18/2016   LDLDIRECT 101.0 11/09/2015   LDLCALC 44 04/18/2016   ALT 23 04/18/2016   AST 18 04/18/2016   NA 138 04/18/2016   K 4.5 04/18/2016   CL 101 04/18/2016   CREATININE 0.69 04/18/2016   BUN 16 04/18/2016   CO2 26 04/18/2016   TSH 3.56 04/18/2016    Lab Results  Component Value Date   TSH 3.56 04/18/2016   Lab Results  Component Value Date   WBC 8.1 04/18/2016   HGB 13.5 04/18/2016   HCT 40.0 04/18/2016   MCV 91.5 04/18/2016   PLT 278 04/18/2016   Lab Results  Component Value Date   NA 138 04/18/2016   K 4.5 04/18/2016   CO2 26 04/18/2016   GLUCOSE 92 04/18/2016   BUN 16 04/18/2016   CREATININE 0.69 04/18/2016   BILITOT 0.4 04/18/2016   ALKPHOS 78 04/18/2016   AST 18 04/18/2016   ALT 23 04/18/2016   PROT 7.3 04/18/2016   ALBUMIN 4.4 04/18/2016   CALCIUM 8.9 04/18/2016   GFR 119.45 03/30/2015   Lab Results  Component Value Date   CHOL 186 04/18/2016   Lab Results  Component Value Date   HDL 62 04/18/2016   Lab Results  Component Value Date   LDLCALC 44 04/18/2016   Lab Results  Component Value Date   TRIG 399* 04/18/2016   Lab Results  Component Value Date   CHOLHDL 3.0 04/18/2016   No results found for: HGBA1C     Assessment & Plan:   Problem List Items Addressed This Visit    Vitamin D deficiency   Relevant Orders   CBC (Completed)   TSH  (Completed)   VITAMIN D 25 Hydroxy (Vit-D Deficiency, Fractures) (Completed)   Lipid panel (Completed)   Comprehensive metabolic panel (Completed)   Tachycardia    RRR today      Hypertension - Primary    Well controlled, no changes to meds. Encouraged heart healthy diet such as the DASH diet and exercise as tolerated.       Relevant Orders   CBC (Completed)   TSH (Completed)   VITAMIN D 25 Hydroxy (Vit-D Deficiency, Fractures) (Completed)   Lipid panel (Completed)   Comprehensive metabolic panel (Completed)   Hyperlipidemia    Tolerating statin, encouraged heart healthy diet, avoid trans fats, minimize simple carbs and saturated fats. Increase exercise as tolerated      Relevant Orders   CBC (Completed)   TSH (Completed)   VITAMIN D 25 Hydroxy (Vit-D Deficiency, Fractures) (Completed)   Lipid panel (Completed)   Comprehensive metabolic panel (Completed)   GERD (gastroesophageal reflux disease)    Avoid offending foods, take probiotics. Do not eat large meals in late evening and consider raising head of bed.        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 Continues to struggle with debility due to pain and decreased ability to move          I have discontinued Ms. Mizrachi oseltamivir. I am also having her maintain her tiZANidine, meloxicam, cyclobenzaprine, oxyCODONE-acetaminophen, pilocarpine, sertraline, propranolol, olmesartan, Vitamin D (Ergocalciferol), atorvastatin, pantoprazole, and meloxicam.  No orders of the defined types were placed in this  encounter.     Penni Homans, MD

## 2016-04-19 LAB — VITAMIN D 25 HYDROXY (VIT D DEFICIENCY, FRACTURES): Vit D, 25-Hydroxy: 40 ng/mL (ref 30–100)

## 2016-04-19 NOTE — Progress Notes (Signed)
[facility] [place], [place] [address] Phone: [phone] Subjective:     CC: Elbow pain, left follow up Right elbow pain  [id number] Kathy Huerta is a 55 y.o. female coming in with complaint of left elbow pain after fall. Patient was seen previously and did have an avulsion fracture of the lateral epicondylar region with a full-thickness tear of the extensor common tendon. Patient states though that she has been doing somewhat better. Having a dull throbbing aching sensation with some mild swelling that seems different.  I do believe though that patient is also having right elbow pain. Patient states that she had this approximately 2 days ago. Had significant swelling and as well as some bruising. Patient states that it is more sore than her contralateral side. States that there is some weakness. Seems very similar to the contralateral side. Rates the severity of pain a 6 out of 10.     Past Medical History  Diagnosis Date  . HTN (hypertension) 2002  . Depression   . Heartburn   . Basal cell carcinoma 11/17/11    Dr. Sammuel Hines  . Headache(784.0) 01/29/2013  . DDD (degenerative disc disease), thoracolumbar     on disability  . DDD (degenerative disc disease), cervical     s/p fusion  . Pancreatitis 04/24/2011  . Cutaneous skin tags 07/24/2013  . Pneumonia 07/24/2013  . Pain in joint, shoulder region 11/20/2013  . Tachycardia 08/12/2014  . Obesity   . Anxiety   . Colon polyp   . Hyperlipemia 08/2014  . Vitamin D deficiency 11/18/2015   Past Surgical History  Procedure Laterality Date  . Anterior fusion cervical spine  2008    C6-7  Dr Saintclair Halsted  . Posterior fusion cervical spine  2011    C6-7  . Cholecystectomy N/A 02/27/2013    Procedure: LAPAROSCOPIC CHOLECYSTECTOMY WITH INTRAOPERATIVE CHOLANGIOGRAM;  Surgeon: Adin Hector, MD;  Location: [facility];  Service: General;  Laterality: N/A;   Social History  Substance Use Topics  . Smoking  status: Never Smoker   . Smokeless tobacco: Never Used  . Alcohol Use: 3.0 oz/week    5 Cans of beer per week   Allergies  Allergen Reactions  . Amoxicillin-Pot Clavulanate     REACTION: Throat swelling.  . Azithromycin     REACTION: Throat Swelling  . Cefazolin     REACTION: Throat Swelling  . Moxifloxacin     REACTION: Throat Swelling  . Penicillins     REACTION: throat swelling  . Sulfonamide Derivatives      Past medical history, social, surgical and family history all reviewed in electronic medical record.   Review of Systems: No headache, visual changes, nausea, vomiting, diarrhea, constipation, dizziness, abdominal pain, skin rash, fevers, chills, night sweats, weight loss, swollen lymph nodes, body aches, joint swelling, muscle aches, chest pain, shortness of breath, mood changes.   Objective Blood pressure 114/82, pulse 84, weight 201 lb (91.173 kg), last menstrual period 09/23/2011.  General: No apparent distress alert and oriented x3 mood and affect normal, dressed appropriately.  HEENT: Pupils equal, extraocular movements intact  Respiratory: Patient's speak in full sentences and does not appear short of breath  Cardiovascular: No lower extremity edema, non tender, no erythema  Skin: Warm dry intact with no signs of infection or rash on extremities or on axial skeleton.  Abdomen: Soft nontender  Neuro: Cranial nerves II through XII are intact, neurovascularly intact in all extremities with 2+  DTRs and 2+ pulses.  Lymph: No lymphadenopathy of posterior or anterior cervical chain or axillae bilaterally.  Gait normal with good balance and coordination.  MSK:  Non tender with full range of motion and good stability and symmetric strength and tone of shoulders,  wrist, hip, knee and ankles bilaterally.  Elbow: Right Swelling noted on inspection Range of motion full pronation, supination, flexion, extension. Pain at the extreme range of motion was in all  directions Strength is full to all of the above directions Stable to varus, valgus stress. Negative moving valgus stress test.  tenderness over the lateral epicondylar region Ulnar nerve does not sublux. Negative cubital tunnel Tinel's. Contralateral elbow has mild swelling as well. Minimal tenderness  Musculoskeletal ultrasound was performed and interpreted by Charlann Boxer D.O.   Elbow: Right Lateral epicondyle and common extensor tendon origin visualized. Patient has significant hypoechoic changes that is consistent with some myositis within the common extensor muscle. Patient's lateral epicondylar region does have what appears to be a very small avulsion fracture noted. Seems to be more of an acute on chronic. Trace effusion within the elbow joint itself  Radial head unremarkable and located in annular ligament Medial epicondyle and common flexor tendon origin visualized.  No edema, effusions, or avulsions seen. Ulnar nerve in cubital tunnel unremarkable. Olecranon and triceps insertion visualized and unremarkable without edema, effusion, or avulsion.  No signs olecranon bursitis. Power doppler signal normal.  IMPRESSION:  Myositis as well as potential small avulsion fracture      Impression and Recommendations:     This case required medical decision making of moderate complexity.

## 2016-04-19 NOTE — Assessment & Plan Note (Signed)
Seems healed on the left side but I do believe the patient's Sjogren's disease is likely contributing to a myositis at this moment. We discussed with patient about possibly prednisone which patient declined.. Her on meloxicam. We'll be following up with her rheumatologist. Disease modifying agents may be necessary.

## 2016-04-19 NOTE — Assessment & Plan Note (Signed)
I believe that patient's polymyalgia and her history of Sjogren's disease is concerning to a myositis at this moment. Seems to be in the elbows bilaterally. Worse on the right side. I do think an injury he did cause a very small avulsion fracture but likely not contribute into as much of the pain. We did discuss the possibility of bracing the wrist which I do not think will be helpful. Given topical anti-inflammatories. Patient has muscle relaxers as needed. Continues to get pain medicine from another provider. Meloxicam prescribed. Warned of potential side effects. Icing regimen. Follow-up in 2 weeks for further evaluation. X-rays pending.

## 2016-04-22 ENCOUNTER — Ambulatory Visit: Payer: 59 | Admitting: Family Medicine

## 2016-04-29 ENCOUNTER — Encounter: Payer: Self-pay | Admitting: Family Medicine

## 2016-04-29 ENCOUNTER — Ambulatory Visit
Admission: RE | Admit: 2016-04-29 | Discharge: 2016-04-29 | Disposition: A | Discharge status: Home / Self Care | Payer: 59 | Source: Ambulatory Visit

## 2016-04-29 DIAGNOSIS — Z1231 Encounter for screening mammogram for malignant neoplasm of breast: Secondary | ICD-10-CM

## 2016-04-29 NOTE — Assessment & Plan Note (Signed)
Tolerating statin, encouraged heart healthy diet, avoid trans fats, minimize simple carbs and saturated fats. Increase exercise as tolerated 

## 2016-04-29 NOTE — Assessment & Plan Note (Signed)
RRR today 

## 2016-04-29 NOTE — Assessment & Plan Note (Signed)
Avoid offending foods, take probiotics. Do not eat large meals in late evening and consider raising head of bed.  

## 2016-04-29 NOTE — Assessment & Plan Note (Signed)
Well controlled, no changes to meds. Encouraged heart healthy diet such as the DASH diet and exercise as tolerated.  °

## 2016-04-29 NOTE — Assessment & Plan Note (Signed)
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 Continues to struggle with debility due to pain and decreased ability to move

## 2016-05-08 ENCOUNTER — Ambulatory Visit: Payer: 59 | Admitting: Family Medicine

## 2016-05-18 ENCOUNTER — Other Ambulatory Visit: Payer: Self-pay | Admitting: Family Medicine

## 2016-05-20 ENCOUNTER — Telehealth: Payer: Self-pay | Admitting: Family Medicine

## 2016-05-20 MED ORDER — SERTRALINE HCL 100 MG PO TABS: ORAL_TABLET | ORAL | Status: DC

## 2016-05-20 NOTE — Telephone Encounter (Signed)
°  Relationship to patient: [place] Can be reached: [phone]  Pharmacy:  [facility] [phone] (Phone) [phone] (Fax)       Reason for call: Please clarify directions on sertraline (ZOLOFT) 100 MG tablet

## 2016-05-20 NOTE — Telephone Encounter (Signed)
Prescription clarified.

## 2016-06-30 ENCOUNTER — Telehealth: Payer: Self-pay | Admitting: *Deleted

## 2016-06-30 MED FILL — TETRACYCLINE 250 MG CAPSULE: 250 | 10 days supply | Qty: 20 | Fill #0

## 2016-06-30 NOTE — Telephone Encounter (Signed)
Pt signed medical records request received via fax from [place] at [facility]. They are requesting records from March 23, 2015 to present. Forwarded to [place] to scan/email to medical records. JG//CMA

## 2016-07-11 ENCOUNTER — Telehealth: Payer: Self-pay | Admitting: Internal Medicine

## 2016-07-11 MED ORDER — PANTOPRAZOLE SODIUM 40 MG PO TBEC: 40.0000 mg | DELAYED_RELEASE_TABLET | Freq: Every day | ORAL | Status: DC

## 2016-07-11 NOTE — Telephone Encounter (Signed)
A user error has taken place: ERROR °

## 2016-07-11 NOTE — Telephone Encounter (Signed)
Rx sent 

## 2016-08-01 ENCOUNTER — Telehealth: Payer: Self-pay | Admitting: Family Medicine

## 2016-08-01 NOTE — Telephone Encounter (Signed)
Pt was taking benicar for high bp. Here insurance says that they will cover generic. (olmesartan) .     Pt says that she is taking a whole pill now instead of half because a whole pill is working better.    Pt is requesting a refill    Pharmacy: [facility] [address] - [place], [place] [address]

## 2016-08-03 NOTE — Telephone Encounter (Signed)
Ok to change her prescription to Olmesartan 20 mg, 1 tab po daily, disp #30 with 2 rf or #90 at patient discretion

## 2016-08-04 MED ORDER — OLMESARTAN MEDOXOMIL 20 MG PO TABS: 20.0000 mg | ORAL_TABLET | Freq: Every day | ORAL | 2 refills | Status: DC

## 2016-08-04 NOTE — Addendum Note (Signed)
Addended by: Sharon Seller B on: 08/04/2016 07:33 AM   Modules accepted: Orders

## 2016-08-04 NOTE — Telephone Encounter (Signed)
Sent in as instructed. Patient informed

## 2016-08-05 ENCOUNTER — Other Ambulatory Visit: Payer: Self-pay | Admitting: Family Medicine

## 2016-08-05 MED ORDER — PROPRANOLOL HCL ER BEADS 80 MG PO CP24: 80.0000 mg | ORAL_CAPSULE | Freq: Every day | ORAL | 3 refills | Status: DC

## 2016-08-25 IMAGING — MG MM DIGITAL DIAGNOSTIC BILAT
4 series · 4 of 4 positions shown · non-contrast
Comparison: Mammography 08/23/2014 (right), 01/24/2014 (bilateral),
dating back to 10/10/2008.

CLINICAL DATA: One year followup of likely benign findings in the
subareolar right breast. Annual evaluation, left breast.

EXAM:
DIGITAL DIAGNOSTIC BILATERAL MAMMOGRAM WITH CAD
ULTRASOUND RIGHT BREAST

[L MLO]
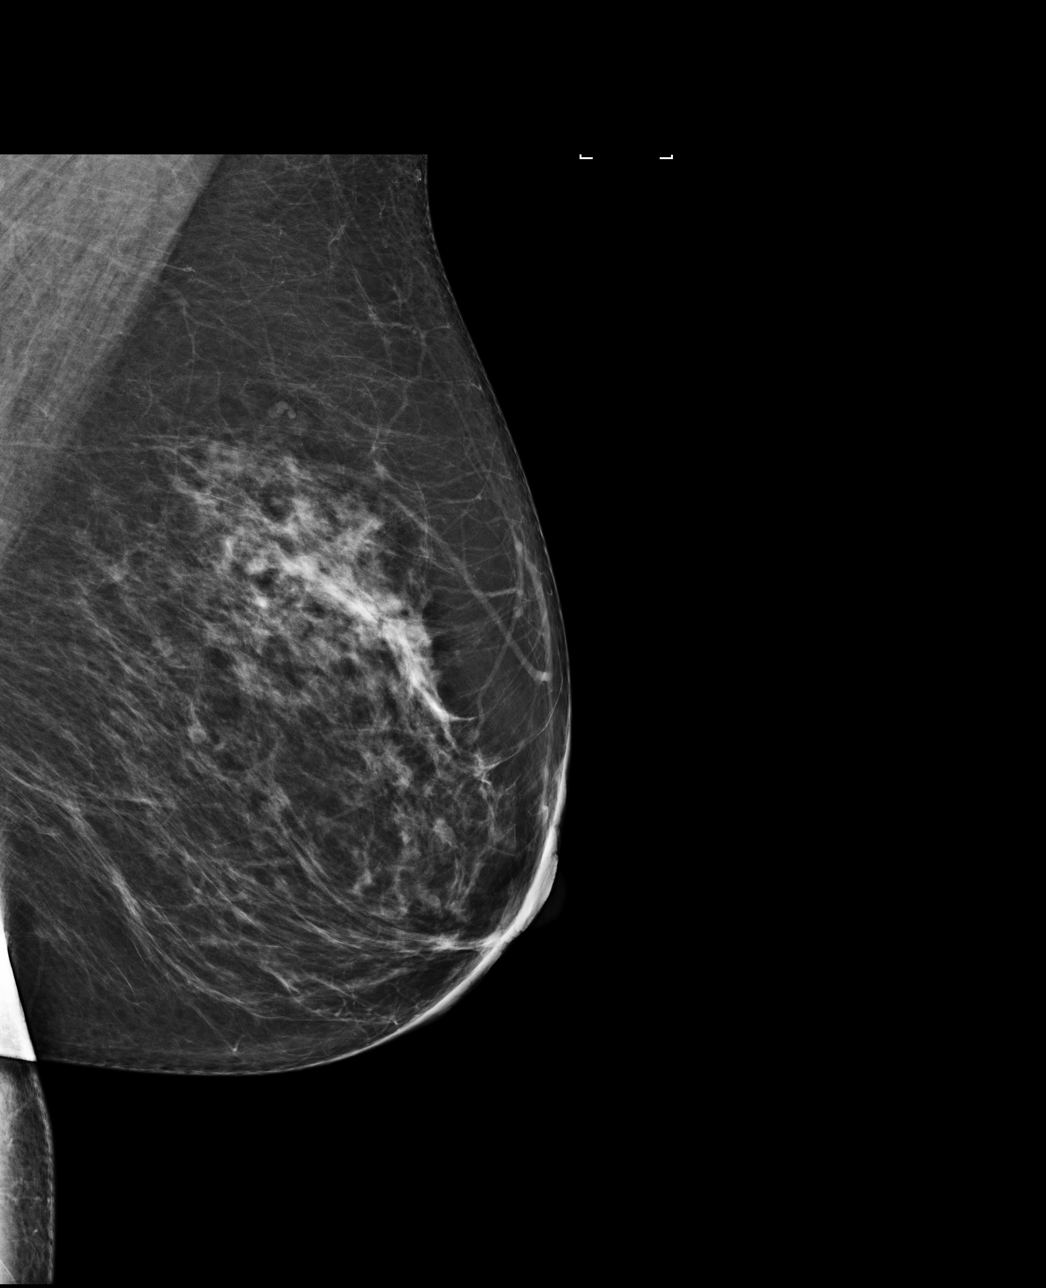

[R MLO]
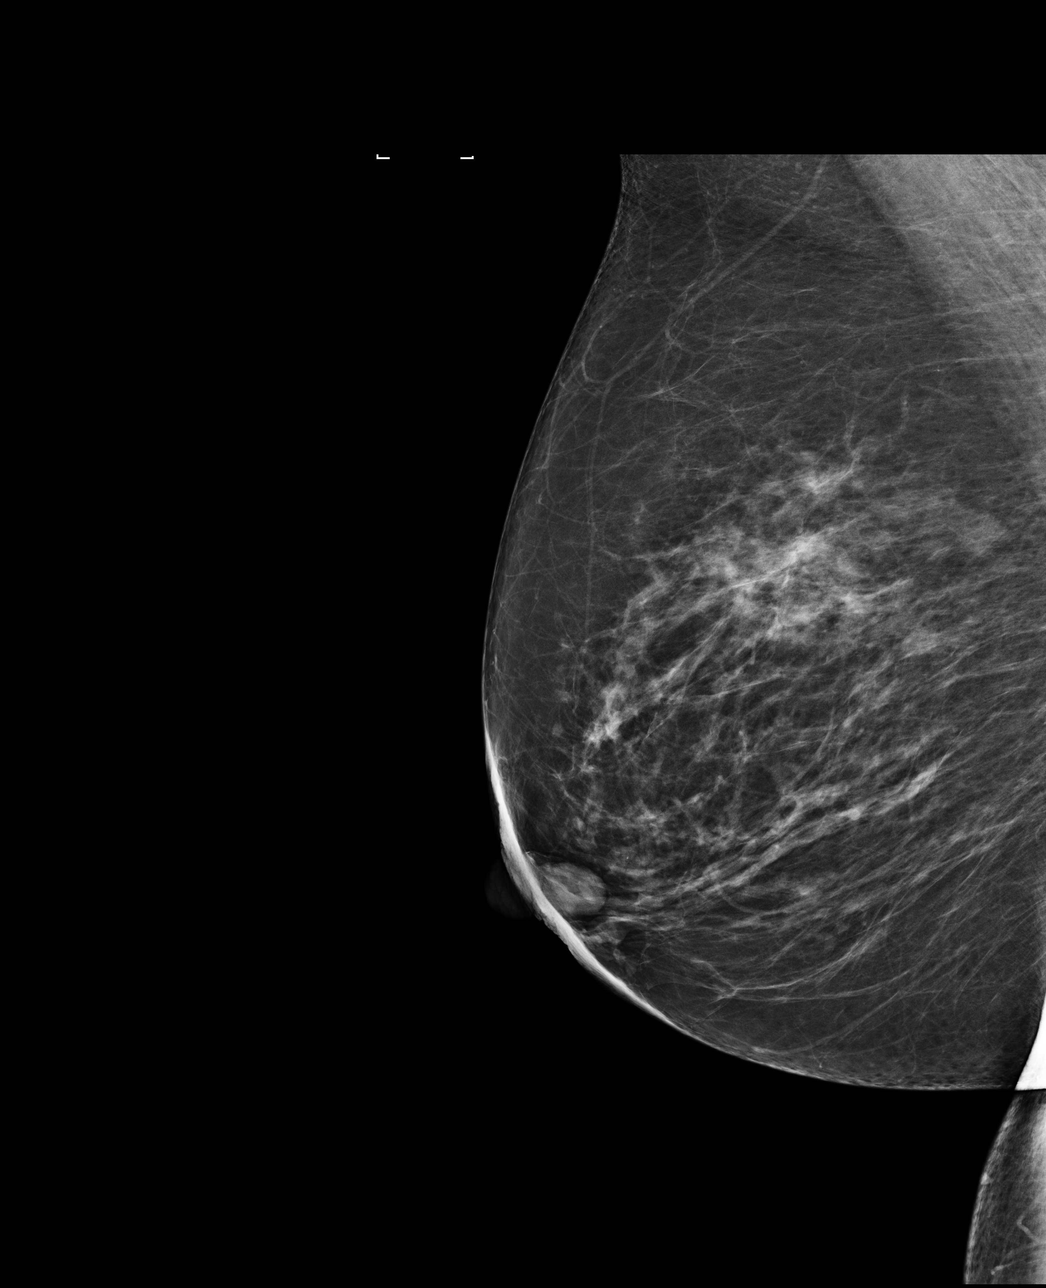

[L CC]
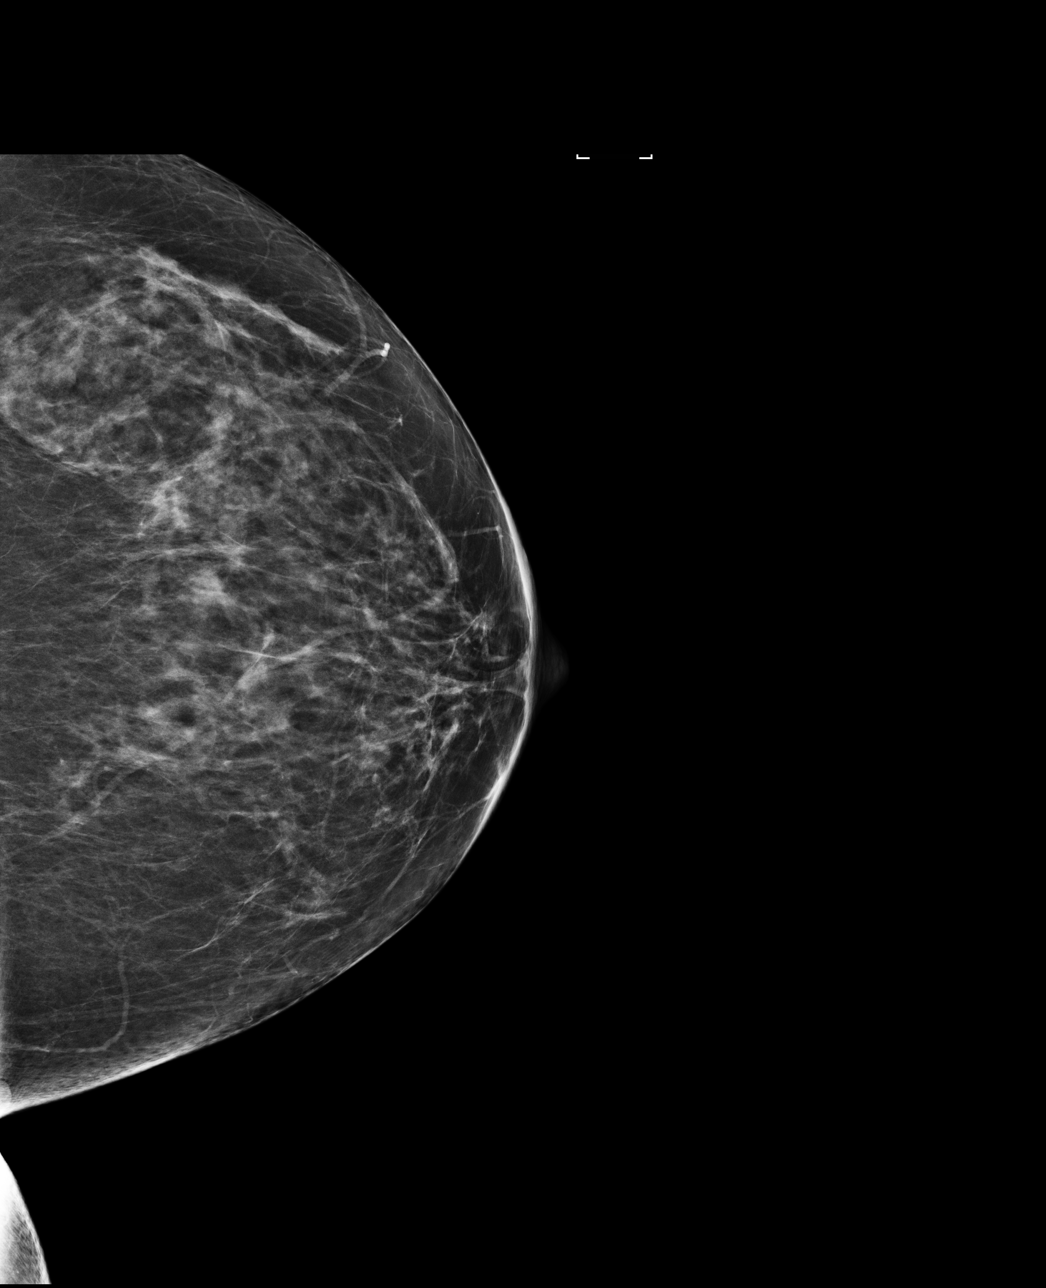

[R CC]
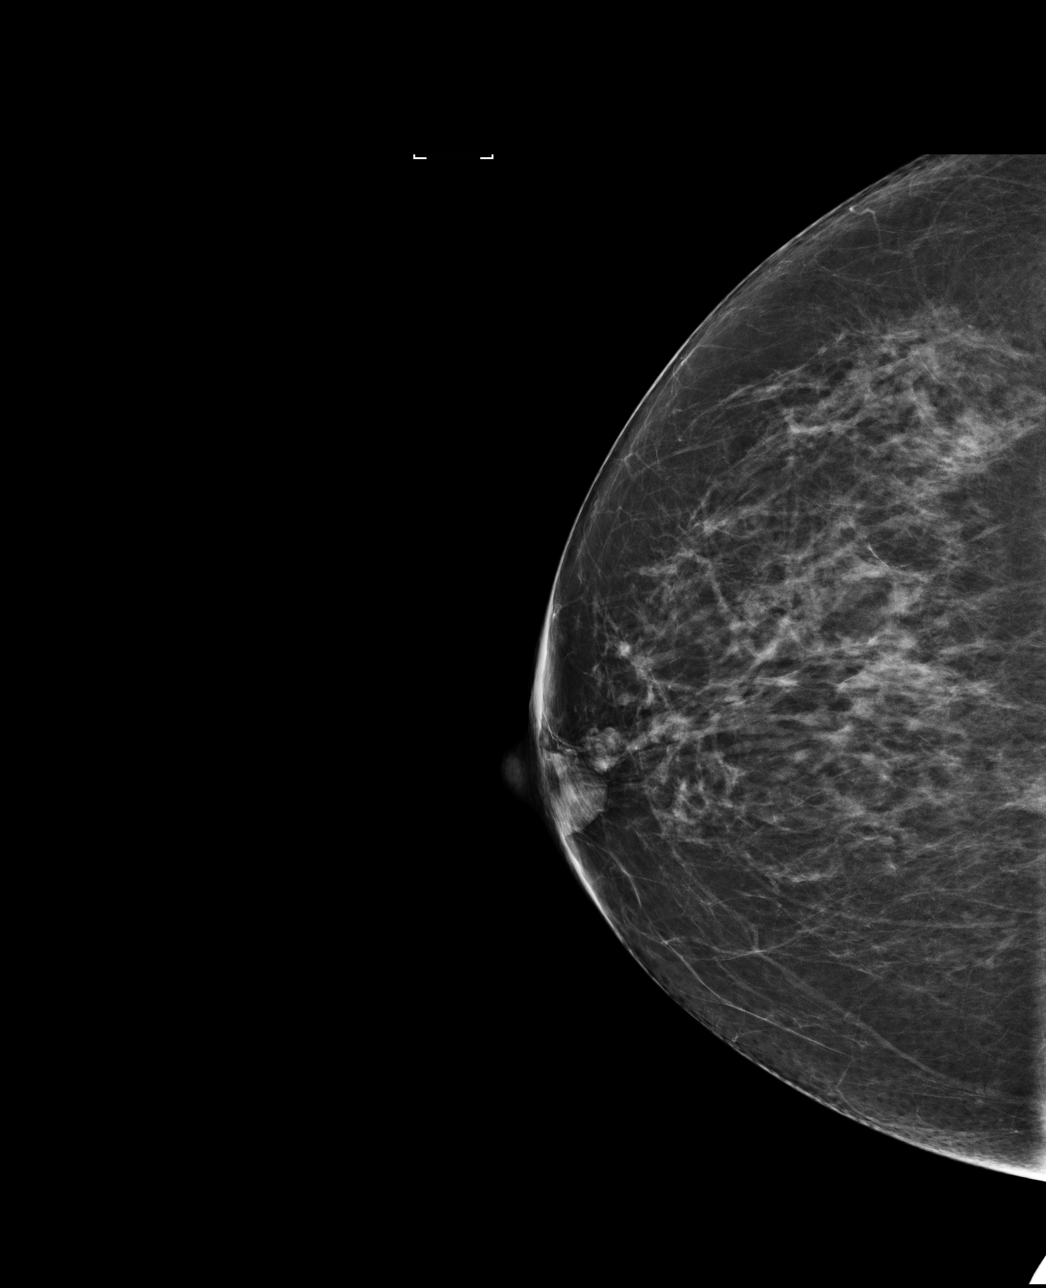

[4 of 4 positions shown; findings below may reference images not displayed]

Right breast ultrasound 08/23/2014,
02/02/2014.

ACR Breast Density Category b: There are scattered areas of
fibroglandular density.
FINDINGS: CC and MLO views of both breasts were obtained. The focal density in
the subareolar right breast is unchanged, and may have been present
on the September 2011 mammogram. No new or suspicious findings in the
right breast.

No findings suspicious for malignancy in the left breast.

Mammographic images were processed with CAD.

On physical exam, there is no palpable abnormality in the subareolar
right breast.

Targeted ultrasound is performed, showing multiple normal-appearing
ducts in the subareolar right breast. At both the 10 o'clock
position and the 5 o'clock position, there is focal dilation of
these ducts, but there are no intraductal masses. No cyst, solid
mass or abnormal acoustic shadowing was identified.
IMPRESSION: 1. No mammographic or sonographic evidence of malignancy, right
breast.
2. No mammographic evidence of malignancy, left breast.

RECOMMENDATION:
Screening mammogram in one year.(Code:BP-[id number])

I have discussed the findings and recommendations with the patient.
Results were also provided in writing at the conclusion of the
visit. If applicable, a reminder letter will be sent to the patient
regarding the next appointment.

BI-RADS CATEGORY  2: Benign.

## 2016-09-15 MED FILL — TETRACYCLINE 250 MG CAPSULE: 250 | 10 days supply | Qty: 20 | Fill #0

## 2016-10-01 ENCOUNTER — Encounter: Payer: Self-pay | Admitting: Internal Medicine

## 2016-10-01 ENCOUNTER — Ambulatory Visit (INDEPENDENT_AMBULATORY_CARE_PROVIDER_SITE_OTHER): Payer: 59 | Admitting: Internal Medicine

## 2016-10-01 VITALS — BP 122/80 | HR 68 | Ht 66.5 in | Wt 206.2 lb

## 2016-10-01 DIAGNOSIS — K219 Gastro-esophageal reflux disease without esophagitis: Secondary | ICD-10-CM | POA: Diagnosis not present

## 2016-10-01 DIAGNOSIS — R195 Other fecal abnormalities: Secondary | ICD-10-CM | POA: Diagnosis not present

## 2016-10-01 MED ORDER — HYOSCYAMINE SULFATE 0.125 MG SL SUBL: SUBLINGUAL_TABLET | SUBLINGUAL | 3 refills | Status: DC

## 2016-10-01 MED ORDER — PANTOPRAZOLE SODIUM 40 MG PO TBEC: 40.0000 mg | DELAYED_RELEASE_TABLET | Freq: Every day | ORAL | 11 refills | Status: DC

## 2016-10-01 NOTE — Patient Instructions (Signed)
Please purchase the following medications over the counter and take as directed: Benefiber 1 tablespoon --- increase to 2 tablespoons daily Imodium as needed  We have sent the following medications to your pharmacy for you to pick up at your convenience: Levsin 1-2 tablets every 6 hours as needed for crampy abdominal pain Pantoprazole  Please follow up with Dr Hilarie Fredrickson in 1 year.  If your stools remain loose after the above measures, please call our office because it may be necessary to complete stool studies.  If you are age 11 or older, your body mass index should be between 23-30. Your Body mass index is 32.78 kg/m. If this is out of the aforementioned range listed, please consider follow up with your Primary Care Provider.  If you are age 77 or younger, your body mass index should be between 19-25. Your Body mass index is 32.78 kg/m. If this is out of the aformentioned range listed, please consider follow up with your Primary Care Provider.

## 2016-10-01 NOTE — Progress Notes (Signed)
Subjective:    Patient ID: Kathy Huerta, female    DOB: June 25, 1961, 55 y.o.   MRN: [id number]  HPI Kathy Huerta is a 55 year old female with a history of GERD, hyperplastic colon polyps, Sjogren's syndrome, hypertension, hyperlipidemia who is here for follow-up. She was last in the office in October 2015 and came for upper endoscopy performed on 11/14/2014 to evaluate heartburn and dysphagia. Showed a 3 senna meter hiatal hernia and a probable nonobstructing Schatzki's ring. The stomach and duodenum appeared normal. She's been maintained on pantoprazole 40 mg daily. She states this is been life changing for her heartburn. She's had no further heartburn or dysphagia. One time in the past year she needed Zantac for breakthrough heartburn which she states was due to overeating and eating late at night. She denies nausea and vomiting. She has had some issues with loose stools over the last year. This doesn't seem related to eating. It's worse in the morning. It can be urgent and has led to several accidents which causes emotional stress for her. She denies seeing blood in her stool or melena. In the past constipation was more of an issue for her. She denies abdominal pain.  Review of Systems As per history of present illness, otherwise negative  Current Medications, Allergies, Past Medical History, Past Surgical History, Family History and Social History were reviewed in [employer] record.     Objective:   Physical Exam BP 122/80   Pulse 68   Ht 5' 6.5" (1.689 m)   Wt 206 lb 3.2 oz (93.5 kg)   LMP 09/23/2011   SpO2 98%   BMI 32.78 kg/m  Constitutional: Well-developed and well-nourished. No distress. HEENT: Normocephalic and atraumatic. Conjunctivae are normal.  No scleral icterus. Neck: Neck supple. Trachea midline. Cardiovascular: Normal rate, regular rhythm and intact distal pulses. No M/R/G Pulmonary/chest: Effort normal and breath sounds normal. No wheezing, rales or  rhonchi. Abdominal: Soft, nontender, nondistended. Bowel sounds active throughout. There are no masses palpable. No hepatosplenomegaly.  Extremities: no clubbing, cyanosis, or edema Neurological: Alert and oriented to person place and time. Skin: Skin is warm and dry. No rashes noted. Psychiatric: Normal mood and affect. Behavior is normal.      Assessment & Plan:  55 year old female with a history of GERD, hyperplastic colon polyps, Sjogren's syndrome, hypertension, hyperlipidemia who is here for follow-up  1. GERD with hiatal hernia -- excellent control of GERD symptoms with pantoprazole. She would like to continue this medication. We did discuss risk of benefits and alternatives and she wishes to proceed. We'll continue pantoprazole 40 mg daily. GERD diet. Bone mineral density testing recommended through primary care.  2. Intermittent loose stools -- worse in the morning without chew diarrhea or increased bowel frequency. This may be a side effect from pantoprazole. We discussed this but she wishes to continue pantoprazole given her excellent response to PPI and control of her reflux symptoms. I recommended consideration of stool studies but she feels that loose stools are infrequent enough that this is not infectious. For now she declines. I will have her begin Benefiber 1-2 tablespoons daily to help bulk and formed stool. Prescription for Levsin 0.125 mg 1-2 tab sublingual as needed for lower abdominal cramping pain and loose stools. Imodium can be used per box instruction. If symptoms worsen or change, should she develop rectal bleeding or abdominal pain, she is asked to notify me immediately. She voices understanding.  One year follow-up, sooner necessary 25 minutes spent with  the patient today. Greater than 50% was spent in counseling and coordination of care with the patient

## 2016-10-28 ENCOUNTER — Encounter: Payer: Self-pay | Admitting: Family Medicine

## 2016-10-28 ENCOUNTER — Ambulatory Visit (INDEPENDENT_AMBULATORY_CARE_PROVIDER_SITE_OTHER): Payer: 59 | Admitting: Family Medicine

## 2016-10-28 VITALS — BP 120/80 | HR 86 | Temp 98.3°F | Ht 67.0 in | Wt 204.5 lb

## 2016-10-28 DIAGNOSIS — F3341 Major depressive disorder, recurrent, in partial remission: Secondary | ICD-10-CM

## 2016-10-28 DIAGNOSIS — M1611 Unilateral primary osteoarthritis, right hip: Secondary | ICD-10-CM | POA: Diagnosis not present

## 2016-10-28 DIAGNOSIS — E559 Vitamin D deficiency, unspecified: Secondary | ICD-10-CM

## 2016-10-28 DIAGNOSIS — Z0000 Encounter for general adult medical examination without abnormal findings: Secondary | ICD-10-CM | POA: Diagnosis not present

## 2016-10-28 DIAGNOSIS — Z23 Encounter for immunization: Secondary | ICD-10-CM | POA: Diagnosis not present

## 2016-10-28 DIAGNOSIS — K219 Gastro-esophageal reflux disease without esophagitis: Secondary | ICD-10-CM

## 2016-10-28 DIAGNOSIS — E782 Mixed hyperlipidemia: Secondary | ICD-10-CM

## 2016-10-28 DIAGNOSIS — K5909 Other constipation: Secondary | ICD-10-CM

## 2016-10-28 DIAGNOSIS — I10 Essential (primary) hypertension: Secondary | ICD-10-CM | POA: Diagnosis not present

## 2016-10-28 DIAGNOSIS — R000 Tachycardia, unspecified: Secondary | ICD-10-CM

## 2016-10-28 HISTORY — DX: Unilateral primary osteoarthritis, right hip: M16.11

## 2016-10-28 HISTORY — DX: Encounter for general adult medical examination without abnormal findings: Z00.00

## 2016-10-28 LAB — COMPREHENSIVE METABOLIC PANEL
ALBUMIN: 4.5 g/dL (ref 3.5–5.2)
ALT: 41 U/L — AB (ref 0–35)
AST: 29 U/L (ref 0–37)
Alkaline Phosphatase: 87 U/L (ref 39–117)
BILIRUBIN TOTAL: 0.6 mg/dL (ref 0.2–1.2)
BUN: 11 mg/dL (ref 6–23)
CALCIUM: 9.9 mg/dL (ref 8.4–10.5)
CO2: 28 mEq/L (ref 19–32)
CREATININE: 0.66 mg/dL (ref 0.40–1.20)
Chloride: 105 mEq/L (ref 96–112)
GFR: 98.25 mL/min (ref >60.00)
Glucose, Bld: 102 mg/dL — ABNORMAL HIGH (ref 70–99)
Potassium: 5.1 mEq/L (ref 3.5–5.1)
Sodium: 139 mEq/L (ref 135–145)
Total Protein: 7.9 g/dL (ref 6.0–8.3)

## 2016-10-28 LAB — TSH: TSH: 2.74 u[IU]/mL (ref 0.35–4.50)

## 2016-10-28 LAB — LIPID PANEL
CHOLESTEROL: 179 mg/dL (ref 0–200)
HDL: 68.4 mg/dL (ref >39.00)
NONHDL: 110.33
TRIGLYCERIDES: 205 mg/dL — AB (ref 0.0–149.0)
Total CHOL/HDL Ratio: 3
VLDL: 41 mg/dL — ABNORMAL HIGH (ref 0.0–40.0)

## 2016-10-28 LAB — CBC
HEMATOCRIT: 41.8 % (ref 36.0–46.0)
HEMOGLOBIN: 14.1 g/dL (ref 12.0–15.0)
MCHC: 33.8 g/dL (ref 30.0–36.0)
MCV: 89.2 fl (ref 78.0–100.0)
PLATELETS: 319 10*3/uL (ref 150.0–400.0)
RBC: 4.68 Mil/uL (ref 3.87–5.11)
RDW: 12.9 % (ref 11.5–15.5)
WBC: 8.5 10*3/uL (ref 4.0–10.5)

## 2016-10-28 LAB — LDL CHOLESTEROL, DIRECT: Direct LDL: 92 mg/dL

## 2016-10-28 LAB — VITAMIN D 25 HYDROXY (VIT D DEFICIENCY, FRACTURES): VITD: 27.68 ng/mL — ABNORMAL LOW (ref 30.00–100.00)

## 2016-10-28 MED ORDER — OLMESARTAN MEDOXOMIL 20 MG PO TABS: 20.0000 mg | ORAL_TABLET | Freq: Every day | ORAL | 2 refills | Status: DC

## 2016-10-28 NOTE — Assessment & Plan Note (Addendum)
Very stressed this past couple of months, was contemplating counseling but is feeling better today. Given paperwork on [facility] if she decides to start.

## 2016-10-28 NOTE — Assessment & Plan Note (Signed)
Consider Lidocaine.gel or patches. Stay active

## 2016-10-28 NOTE — Progress Notes (Signed)
Pre visit review using our clinic review tool, if applicable. No additional management support is needed unless otherwise documented below in the visit note. 

## 2016-10-28 NOTE — Assessment & Plan Note (Signed)
RRR today 

## 2016-10-28 NOTE — Assessment & Plan Note (Signed)
Doing well on Benefiber, consider trying a new probiotic

## 2016-10-28 NOTE — Assessment & Plan Note (Signed)
Patient encouraged to maintain heart healthy diet, regular exercise, adequate sleep. Consider daily probiotics. Take medications as prescribed. Given and reviewed copy of ACP documents from [place] Secretary of State and encouraged to complete and return 

## 2016-10-28 NOTE — Assessment & Plan Note (Signed)
Well controlled, no changes to meds. Encouraged heart healthy diet such as the DASH diet and exercise as tolerated.  °

## 2016-10-28 NOTE — Assessment & Plan Note (Signed)
Check level today 

## 2016-10-28 NOTE — Assessment & Plan Note (Signed)
Say gastroenterology recently, doing better at this time. Has done better on Benefiber than on Metamucil

## 2016-10-28 NOTE — Assessment & Plan Note (Signed)
Tolerating statin, encouraged heart healthy diet, avoid trans fats, minimize simple carbs and saturated fats. Increase exercise as tolerated 

## 2016-10-28 NOTE — Patient Instructions (Signed)
Preventive Care for Adults, Female A healthy lifestyle and preventive care can promote health and wellness. Preventive health guidelines for women include the following key practices.  A routine yearly physical is a good way to check with your health care provider about your health and preventive screening. It is a chance to share any concerns and updates on your health and to receive a thorough exam.  Visit your dentist for a routine exam and preventive care every 6 months. Brush your teeth twice a day and floss once a day. Good oral hygiene prevents tooth decay and gum disease.  The frequency of eye exams is based on your age, health, family medical history, use of contact lenses, and other factors. Follow your health care provider's recommendations for frequency of eye exams.  Eat a healthy diet. Foods like vegetables, fruits, whole grains, low-fat dairy products, and lean protein foods contain the nutrients you need without too many calories. Decrease your intake of foods high in solid fats, added sugars, and salt. Eat the right amount of calories for you.Get information about a proper diet from your health care provider, if necessary.  Regular physical exercise is one of the most important things you can do for your health. Most adults should get at least 150 minutes of moderate-intensity exercise (any activity that increases your heart rate and causes you to sweat) each week. In addition, most adults need muscle-strengthening exercises on 2 or more days a week.  Maintain a healthy weight. The body mass index (BMI) is a screening tool to identify possible weight problems. It provides an estimate of body fat based on height and weight. Your health care provider can find your BMI and can help you achieve or maintain a healthy weight.For adults 20 years and older:  A BMI below 18.5 is considered underweight.  A BMI of 18.5 to 24.9 is normal.  A BMI of 25 to 29.9 is considered overweight.  A  BMI of 30 and above is considered obese.  Maintain normal blood lipids and cholesterol levels by exercising and minimizing your intake of saturated fat. Eat a balanced diet with plenty of fruit and vegetables. Blood tests for lipids and cholesterol should begin at age 45 and be repeated every 5 years. If your lipid or cholesterol levels are high, you are over 50, or you are at high risk for heart disease, you may need your cholesterol levels checked more frequently.Ongoing high lipid and cholesterol levels should be treated with medicines if diet and exercise are not working.  If you smoke, find out from your health care provider how to quit. If you do not use tobacco, do not start.  Lung cancer screening is recommended for adults aged 45-80 years who are at high risk for developing lung cancer because of a history of smoking. A yearly low-dose CT scan of the lungs is recommended for people who have at least a 30-pack-year history of smoking and are a current smoker or have quit within the past 15 years. A pack year of smoking is smoking an average of 1 pack of cigarettes a day for 1 year (for example: 1 pack a day for 30 years or 2 packs a day for 15 years). Yearly screening should continue until the smoker has stopped smoking for at least 15 years. Yearly screening should be stopped for people who develop a health problem that would prevent them from having lung cancer treatment.  If you are pregnant, do not drink alcohol. If you are  breastfeeding, be very cautious about drinking alcohol. If you are not pregnant and choose to drink alcohol, do not have more than 1 drink per day. One drink is considered to be 12 ounces (355 mL) of beer, 5 ounces (148 mL) of wine, or 1.5 ounces (44 mL) of liquor.  Avoid use of street drugs. Do not share needles with anyone. Ask for help if you need support or instructions about stopping the use of drugs.  High blood pressure causes heart disease and increases the risk  of stroke. Your blood pressure should be checked at least every 1 to 2 years. Ongoing high blood pressure should be treated with medicines if weight loss and exercise do not work.  If you are 55-79 years old, ask your health care provider if you should take aspirin to prevent strokes.  Diabetes screening is done by taking a blood sample to check your blood glucose level after you have not eaten for a certain period of time (fasting). If you are not overweight and you do not have risk factors for diabetes, you should be screened once every 3 years starting at age 45. If you are overweight or obese and you are 40-70 years of age, you should be screened for diabetes every year as part of your cardiovascular risk assessment.  Breast cancer screening is essential preventive care for women. You should practice "breast self-awareness." This means understanding the normal appearance and feel of your breasts and may include breast self-examination. Any changes detected, no matter how small, should be reported to a health care provider. Women in their 20s and 30s should have a clinical breast exam (CBE) by a health care provider as part of a regular health exam every 1 to 3 years. After age 40, women should have a CBE every year. Starting at age 40, women should consider having a mammogram (breast X-ray test) every year. Women who have a family history of breast cancer should talk to their health care provider about genetic screening. Women at a high risk of breast cancer should talk to their health care providers about having an MRI and a mammogram every year.  Breast cancer gene (BRCA)-related cancer risk assessment is recommended for women who have family members with BRCA-related cancers. BRCA-related cancers include breast, ovarian, tubal, and peritoneal cancers. Having family members with these cancers may be associated with an increased risk for harmful changes (mutations) in the breast cancer genes BRCA1 and  BRCA2. Results of the assessment will determine the need for genetic counseling and BRCA1 and BRCA2 testing.  Your health care provider may recommend that you be screened regularly for cancer of the pelvic organs (ovaries, uterus, and vagina). This screening involves a pelvic examination, including checking for microscopic changes to the surface of your cervix (Pap test). You may be encouraged to have this screening done every 3 years, beginning at age 21.  For women ages 30-65, health care providers may recommend pelvic exams and Pap testing every 3 years, or they may recommend the Pap and pelvic exam, combined with testing for human papilloma virus (HPV), every 5 years. Some types of HPV increase your risk of cervical cancer. Testing for HPV may also be done on women of any age with unclear Pap test results.  Other health care providers may not recommend any screening for nonpregnant women who are considered low risk for pelvic cancer and who do not have symptoms. Ask your health care provider if a screening pelvic exam is right for   you.  If you have had past treatment for cervical cancer or a condition that could lead to cancer, you need Pap tests and screening for cancer for at least 20 years after your treatment. If Pap tests have been discontinued, your risk factors (such as having a new sexual partner) need to be reassessed to determine if screening should resume. Some women have medical problems that increase the chance of getting cervical cancer. In these cases, your health care provider may recommend more frequent screening and Pap tests.  Colorectal cancer can be detected and often prevented. Most routine colorectal cancer screening begins at the age of 50 years and continues through age 75 years. However, your health care provider may recommend screening at an earlier age if you have risk factors for colon cancer. On a yearly basis, your health care provider may provide home test kits to check  for hidden blood in the stool. Use of a small camera at the end of a tube, to directly examine the colon (sigmoidoscopy or colonoscopy), can detect the earliest forms of colorectal cancer. Talk to your health care provider about this at age 50, when routine screening begins. Direct exam of the colon should be repeated every 5-10 years through age 75 years, unless early forms of precancerous polyps or small growths are found.  People who are at an increased risk for hepatitis B should be screened for this virus. You are considered at high risk for hepatitis B if:  You were born in a country where hepatitis B occurs often. Talk with your health care provider about which countries are considered high risk.  Your parents were born in a high-risk country and you have not received a shot to protect against hepatitis B (hepatitis B vaccine).  You have HIV or AIDS.  You use needles to inject street drugs.  You live with, or have sex with, someone who has hepatitis B.  You get hemodialysis treatment.  You take certain medicines for conditions like cancer, organ transplantation, and autoimmune conditions.  Hepatitis C blood testing is recommended for all people born from 1945 through 1965 and any individual with known risks for hepatitis C.  Practice safe sex. Use condoms and avoid high-risk sexual practices to reduce the spread of sexually transmitted infections (STIs). STIs include gonorrhea, chlamydia, syphilis, trichomonas, herpes, HPV, and human immunodeficiency virus (HIV). Herpes, HIV, and HPV are viral illnesses that have no cure. They can result in disability, cancer, and death.  You should be screened for sexually transmitted illnesses (STIs) including gonorrhea and chlamydia if:  You are sexually active and are younger than 24 years.  You are older than 24 years and your health care provider tells you that you are at risk for this type of infection.  Your sexual activity has changed  since you were last screened and you are at an increased risk for chlamydia or gonorrhea. Ask your health care provider if you are at risk.  If you are at risk of being infected with HIV, it is recommended that you take a prescription medicine daily to prevent HIV infection. This is called preexposure prophylaxis (PrEP). You are considered at risk if:  You are sexually active and do not regularly use condoms or know the HIV status of your partner(s).  You take drugs by injection.  You are sexually active with a partner who has HIV.  Talk with your health care provider about whether you are at high risk of being infected with HIV. If   you choose to begin PrEP, you should first be tested for HIV. You should then be tested every 3 months for as long as you are taking PrEP.  Osteoporosis is a disease in which the bones lose minerals and strength with aging. This can result in serious bone fractures or breaks. The risk of osteoporosis can be identified using a bone density scan. Women ages 67 years and over and women at risk for fractures or osteoporosis should discuss screening with their health care providers. Ask your health care provider whether you should take a calcium supplement or vitamin D to reduce the rate of osteoporosis.  Menopause can be associated with physical symptoms and risks. Hormone replacement therapy is available to decrease symptoms and risks. You should talk to your health care provider about whether hormone replacement therapy is right for you.  Use sunscreen. Apply sunscreen liberally and repeatedly throughout the day. You should seek shade when your shadow is shorter than you. Protect yourself by wearing long sleeves, pants, a wide-brimmed hat, and sunglasses year round, whenever you are outdoors.  Once a month, do a whole body skin exam, using a mirror to look at the skin on your back. Tell your health care provider of new moles, moles that have irregular borders, moles that  are larger than a pencil eraser, or moles that have changed in shape or color.  Stay current with required vaccines (immunizations).  Influenza vaccine. All adults should be immunized every year.  Tetanus, diphtheria, and acellular pertussis (Td, Tdap) vaccine. Pregnant women should receive 1 dose of Tdap vaccine during each pregnancy. The dose should be obtained regardless of the length of time since the last dose. Immunization is preferred during the 27th-36th week of gestation. An adult who has not previously received Tdap or who does not know her vaccine status should receive 1 dose of Tdap. This initial dose should be followed by tetanus and diphtheria toxoids (Td) booster doses every 10 years. Adults with an unknown or incomplete history of completing a 3-dose immunization series with Td-containing vaccines should begin or complete a primary immunization series including a Tdap dose. Adults should receive a Td booster every 10 years.  Varicella vaccine. An adult without evidence of immunity to varicella should receive 2 doses or a second dose if she has previously received 1 dose. Pregnant females who do not have evidence of immunity should receive the first dose after pregnancy. This first dose should be obtained before leaving the health care facility. The second dose should be obtained 4-8 weeks after the first dose.  Human papillomavirus (HPV) vaccine. Females aged 13-26 years who have not received the vaccine previously should obtain the 3-dose series. The vaccine is not recommended for use in pregnant females. However, pregnancy testing is not needed before receiving a dose. If a female is found to be pregnant after receiving a dose, no treatment is needed. In that case, the remaining doses should be delayed until after the pregnancy. Immunization is recommended for any person with an immunocompromised condition through the age of 61 years if she did not get any or all doses earlier. During the  3-dose series, the second dose should be obtained 4-8 weeks after the first dose. The third dose should be obtained 24 weeks after the first dose and 16 weeks after the second dose.  Zoster vaccine. One dose is recommended for adults aged 30 years or older unless certain conditions are present.  Measles, mumps, and rubella (MMR) vaccine. Adults born  before 1957 generally are considered immune to measles and mumps. Adults born in 1957 or later should have 1 or more doses of MMR vaccine unless there is a contraindication to the vaccine or there is laboratory evidence of immunity to each of the three diseases. A routine second dose of MMR vaccine should be obtained at least 28 days after the first dose for students attending postsecondary schools, health care workers, or international travelers. People who received inactivated measles vaccine or an unknown type of measles vaccine during 1963-1967 should receive 2 doses of MMR vaccine. People who received inactivated mumps vaccine or an unknown type of mumps vaccine before 1979 and are at high risk for mumps infection should consider immunization with 2 doses of MMR vaccine. For females of childbearing age, rubella immunity should be determined. If there is no evidence of immunity, females who are not pregnant should be vaccinated. If there is no evidence of immunity, females who are pregnant should delay immunization until after pregnancy. Unvaccinated health care workers born before 1957 who lack laboratory evidence of measles, mumps, or rubella immunity or laboratory confirmation of disease should consider measles and mumps immunization with 2 doses of MMR vaccine or rubella immunization with 1 dose of MMR vaccine.  Pneumococcal 13-valent conjugate (PCV13) vaccine. When indicated, a person who is uncertain of his immunization history and has no record of immunization should receive the PCV13 vaccine. All adults 65 years of age and older should receive this  vaccine. An adult aged 19 years or older who has certain medical conditions and has not been previously immunized should receive 1 dose of PCV13 vaccine. This PCV13 should be followed with a dose of pneumococcal polysaccharide (PPSV23) vaccine. Adults who are at high risk for pneumococcal disease should obtain the PPSV23 vaccine at least 8 weeks after the dose of PCV13 vaccine. Adults older than 56 years of age who have normal immune system function should obtain the PPSV23 vaccine dose at least 1 year after the dose of PCV13 vaccine.  Pneumococcal polysaccharide (PPSV23) vaccine. When PCV13 is also indicated, PCV13 should be obtained first. All adults aged 65 years and older should be immunized. An adult younger than age 65 years who has certain medical conditions should be immunized. Any person who resides in a nursing home or long-term care facility should be immunized. An adult smoker should be immunized. People with an immunocompromised condition and certain other conditions should receive both PCV13 and PPSV23 vaccines. People with human immunodeficiency virus (HIV) infection should be immunized as soon as possible after diagnosis. Immunization during chemotherapy or radiation therapy should be avoided. Routine use of PPSV23 vaccine is not recommended for American Indians, [place] Natives, or people younger than 65 years unless there are medical conditions that require PPSV23 vaccine. When indicated, people who have unknown immunization and have no record of immunization should receive PPSV23 vaccine. One-time revaccination 5 years after the first dose of PPSV23 is recommended for people aged 19-64 years who have chronic kidney failure, nephrotic syndrome, asplenia, or immunocompromised conditions. People who received 1-2 doses of PPSV23 before age 65 years should receive another dose of PPSV23 vaccine at age 65 years or later if at least 5 years have passed since the previous dose. Doses of PPSV23 are not  needed for people immunized with PPSV23 at or after age 65 years.  Meningococcal vaccine. Adults with asplenia or persistent complement component deficiencies should receive 2 doses of quadrivalent meningococcal conjugate (MenACWY-D) vaccine. The doses should be obtained   at least 2 months apart. Microbiologists working with certain meningococcal bacteria, [place] recruits, people at risk during an outbreak, and people who travel to or live in countries with a high rate of meningitis should be immunized. A first-year college student up through age 34 years who is living in a residence hall should receive a dose if she did not receive a dose on or after her 16th birthday. Adults who have certain high-risk conditions should receive one or more doses of vaccine.  Hepatitis A vaccine. Adults who wish to be protected from this disease, have certain high-risk conditions, work with hepatitis A-infected animals, work in hepatitis A research labs, or travel to or work in countries with a high rate of hepatitis A should be immunized. Adults who were previously unvaccinated and who anticipate close contact with an international adoptee during the first 60 days after arrival in the [place] from a country with a high rate of hepatitis A should be immunized.  Hepatitis B vaccine. Adults who wish to be protected from this disease, have certain high-risk conditions, may be exposed to blood or other infectious body fluids, are household contacts or sex partners of hepatitis B positive people, are clients or workers in certain care facilities, or travel to or work in countries with a high rate of hepatitis B should be immunized.  Haemophilus influenzae type b (Hib) vaccine. A previously unvaccinated person with asplenia or sickle cell disease or having a scheduled splenectomy should receive 1 dose of Hib vaccine. Regardless of previous immunization, a recipient of a hematopoietic stem cell transplant should receive a  3-dose series 6-12 months after her successful transplant. Hib vaccine is not recommended for adults with HIV infection. Preventive Services / Frequency Ages 35 to 4 years  Blood pressure check.** / Every 3-5 years.  Lipid and cholesterol check.** / Every 5 years beginning at age 60.  Clinical breast exam.** / Every 3 years for women in their 71s and 10s.  BRCA-related cancer risk assessment.** / For women who have family members with a BRCA-related cancer (breast, ovarian, tubal, or peritoneal cancers).  Pap test.** / Every 2 years from ages 76 through 26. Every 3 years starting at age 61 through age 76 or 93 with a history of 3 consecutive normal Pap tests.  HPV screening.** / Every 3 years from ages 37 through ages 60 to 51 with a history of 3 consecutive normal Pap tests.  Hepatitis C blood test.** / For any individual with known risks for hepatitis C.  Skin self-exam. / Monthly.  Influenza vaccine. / Every year.  Tetanus, diphtheria, and acellular pertussis (Tdap, Td) vaccine.** / Consult your health care provider. Pregnant women should receive 1 dose of Tdap vaccine during each pregnancy. 1 dose of Td every 10 years.  Varicella vaccine.** / Consult your health care provider. Pregnant females who do not have evidence of immunity should receive the first dose after pregnancy.  HPV vaccine. / 3 doses over 6 months, if 93 and younger. The vaccine is not recommended for use in pregnant females. However, pregnancy testing is not needed before receiving a dose.  Measles, mumps, rubella (MMR) vaccine.** / You need at least 1 dose of MMR if you were born in 1957 or later. You may also need a 2nd dose. For females of childbearing age, rubella immunity should be determined. If there is no evidence of immunity, females who are not pregnant should be vaccinated. If there is no evidence of immunity, females who are  pregnant should delay immunization until after pregnancy.  Pneumococcal  13-valent conjugate (PCV13) vaccine.** / Consult your health care provider.  Pneumococcal polysaccharide (PPSV23) vaccine.** / 1 to 2 doses if you smoke cigarettes or if you have certain conditions.  Meningococcal vaccine.** / 1 dose if you are age 68 to 8 years and a Market researcher living in a residence hall, or have one of several medical conditions, you need to get vaccinated against meningococcal disease. You may also need additional booster doses.  Hepatitis A vaccine.** / Consult your health care provider.  Hepatitis B vaccine.** / Consult your health care provider.  Haemophilus influenzae type b (Hib) vaccine.** / Consult your health care provider. Ages 7 to 53 years  Blood pressure check.** / Every year.  Lipid and cholesterol check.** / Every 5 years beginning at age 25 years.  Lung cancer screening. / Every year if you are aged 11-80 years and have a 30-pack-year history of smoking and currently smoke or have quit within the past 15 years. Yearly screening is stopped once you have quit smoking for at least 15 years or develop a health problem that would prevent you from having lung cancer treatment.  Clinical breast exam.** / Every year after age 48 years.  BRCA-related cancer risk assessment.** / For women who have family members with a BRCA-related cancer (breast, ovarian, tubal, or peritoneal cancers).  Mammogram.** / Every year beginning at age 41 years and continuing for as long as you are in good health. Consult with your health care provider.  Pap test.** / Every 3 years starting at age 65 years through age 37 or 70 years with a history of 3 consecutive normal Pap tests.  HPV screening.** / Every 3 years from ages 72 years through ages 60 to 40 years with a history of 3 consecutive normal Pap tests.  Fecal occult blood test (FOBT) of stool. / Every year beginning at age 21 years and continuing until age 5 years. You may not need to do this test if you get  a colonoscopy every 10 years.  Flexible sigmoidoscopy or colonoscopy.** / Every 5 years for a flexible sigmoidoscopy or every 10 years for a colonoscopy beginning at age 35 years and continuing until age 48 years.  Hepatitis C blood test.** / For all people born from [birth year] through 1965 and any individual with known risks for hepatitis C.  Skin self-exam. / Monthly.  Influenza vaccine. / Every year.  Tetanus, diphtheria, and acellular pertussis (Tdap/Td) vaccine.** / Consult your health care provider. Pregnant women should receive 1 dose of Tdap vaccine during each pregnancy. 1 dose of Td every 10 years.  Varicella vaccine.** / Consult your health care provider. Pregnant females who do not have evidence of immunity should receive the first dose after pregnancy.  Zoster vaccine.** / 1 dose for adults aged 30 years or older.  Measles, mumps, rubella (MMR) vaccine.** / You need at least 1 dose of MMR if you were born in 1957 or later. You may also need a second dose. For females of childbearing age, rubella immunity should be determined. If there is no evidence of immunity, females who are not pregnant should be vaccinated. If there is no evidence of immunity, females who are pregnant should delay immunization until after pregnancy.  Pneumococcal 13-valent conjugate (PCV13) vaccine.** / Consult your health care provider.  Pneumococcal polysaccharide (PPSV23) vaccine.** / 1 to 2 doses if you smoke cigarettes or if you have certain conditions.  Meningococcal vaccine.** /  Consult your health care provider.  Hepatitis A vaccine.** / Consult your health care provider.  Hepatitis B vaccine.** / Consult your health care provider.  Haemophilus influenzae type b (Hib) vaccine.** / Consult your health care provider. Ages 64 years and over  Blood pressure check.** / Every year.  Lipid and cholesterol check.** / Every 5 years beginning at age 23 years.  Lung cancer screening. / Every year if you  are aged 16-80 years and have a 30-pack-year history of smoking and currently smoke or have quit within the past 15 years. Yearly screening is stopped once you have quit smoking for at least 15 years or develop a health problem that would prevent you from having lung cancer treatment.  Clinical breast exam.** / Every year after age 74 years.  BRCA-related cancer risk assessment.** / For women who have family members with a BRCA-related cancer (breast, ovarian, tubal, or peritoneal cancers).  Mammogram.** / Every year beginning at age 44 years and continuing for as long as you are in good health. Consult with your health care provider.  Pap test.** / Every 3 years starting at age 58 years through age 22 or 39 years with 3 consecutive normal Pap tests. Testing can be stopped between 65 and 70 years with 3 consecutive normal Pap tests and no abnormal Pap or HPV tests in the past 10 years.  HPV screening.** / Every 3 years from ages 64 years through ages 70 or 61 years with a history of 3 consecutive normal Pap tests. Testing can be stopped between 65 and 70 years with 3 consecutive normal Pap tests and no abnormal Pap or HPV tests in the past 10 years.  Fecal occult blood test (FOBT) of stool. / Every year beginning at age 40 years and continuing until age 27 years. You may not need to do this test if you get a colonoscopy every 10 years.  Flexible sigmoidoscopy or colonoscopy.** / Every 5 years for a flexible sigmoidoscopy or every 10 years for a colonoscopy beginning at age 7 years and continuing until age 32 years.  Hepatitis C blood test.** / For all people born from [birth year] through 1966 and any individual with known risks for hepatitis C.  Osteoporosis screening.** / A one-time screening for women ages 30 years and over and women at risk for fractures or osteoporosis.  Skin self-exam. / Monthly.  Influenza vaccine. / Every year.  Tetanus, diphtheria, and acellular pertussis (Tdap/Td)  vaccine.** / 1 dose of Td every 10 years.  Varicella vaccine.** / Consult your health care provider.  Zoster vaccine.** / 1 dose for adults aged 35 years or older.  Pneumococcal 13-valent conjugate (PCV13) vaccine.** / Consult your health care provider.  Pneumococcal polysaccharide (PPSV23) vaccine.** / 1 dose for all adults aged 46 years and older.  Meningococcal vaccine.** / Consult your health care provider.  Hepatitis A vaccine.** / Consult your health care provider.  Hepatitis B vaccine.** / Consult your health care provider.  Haemophilus influenzae type b (Hib) vaccine.** / Consult your health care provider. ** Family history and personal history of risk and conditions may change your health care provider's recommendations.   This information is not intended to replace advice given to you by your health care provider. Make sure you discuss any questions you have with your health care provider.   Document Released: 02/03/2002 Document Revised: 12/29/2014 Document Reviewed: 05/05/2011 Elsevier Interactive Patient Education [employer].

## 2016-10-28 NOTE — Progress Notes (Signed)
Patient ID: Kathy Huerta, female   DOB: [birth year], 55 y.o.   MRN: [id number]   Subjective:    Patient ID: Kathy Huerta, female    DOB: 07-27-1961, 55 y.o.   MRN: [id number]  Chief Complaint  Patient presents with  . Annual Exam    HPI Patient is in today for follow up. She is struggling with a great deal of stress. Her brother has been laid off and other family stressors are also noted. No recent illness or hospitalizations. Denies CP/palp/SOB/HA/congestion/fevers/GI or GU c/o. Taking meds as prescribed  Past Medical History:  Diagnosis Date  . Anxiety   . Arthritis of right hip 10/28/2016  . Basal cell carcinoma 11/17/11   Dr. Sammuel Hines  . Colon polyp   . Cutaneous skin tags 07/24/2013  . DDD (degenerative disc disease), cervical    s/p fusion  . DDD (degenerative disc disease), thoracolumbar    on disability  . Depression   . Headache(784.0) 01/29/2013  . Heartburn   . HTN (hypertension) 2002  . Hyperlipemia 08/2014  . Obesity   . Pain in joint, shoulder region 11/20/2013  . Pancreatitis 04/24/2011  . Pneumonia 07/24/2013  . Preventative health care 10/28/2016  . Tachycardia 08/12/2014  . Vitamin D deficiency 11/18/2015    Past Surgical History:  Procedure Laterality Date  . ANTERIOR FUSION CERVICAL SPINE  2008   C6-7  Dr Saintclair Halsted  . CHOLECYSTECTOMY N/A 02/27/2013   Procedure: LAPAROSCOPIC CHOLECYSTECTOMY WITH INTRAOPERATIVE CHOLANGIOGRAM;  Surgeon: Adin Hector, MD;  Location: [facility];  Service: General;  Laterality: N/A;  . POSTERIOR FUSION CERVICAL SPINE  2011   C6-7    Family History  Problem Relation Age of Onset  . Hypertension Mother   . Hyperlipidemia Mother   . Other Father     DDD, colectomy,cardiac stent, afib  . Colon polyps Father   . Hypertension Brother   . Diabetes Maternal Grandmother   . Colon cancer Neg Hx   . Stomach cancer Neg Hx   . Kidney disease Neg Hx     Social History   Social History  . Marital status: Married    Spouse name: N/A  .  Number of children: 0  . Years of education: N/A   Occupational History  . Disabled Unemployed   Social History Main Topics  . Smoking status: Never Smoker  . Smokeless tobacco: Never Used  . Alcohol use 3.0 oz/week    5 Cans of beer per week  . Drug use: No  . Sexual activity: Not on file   Other Topics Concern  . Not on file   Social History Narrative   Reviewed history and no changes required.   Occupation: Disabled,  has worked in the past for a [employer] (medical))   Married   Alcohol use-yes- every weekend 4-5 beers.   Regular exercise-no   Smoking Status:  quit   Caffeine use/day:  none   Does Patient Exercise:  no          Outpatient Medications Prior to Visit  Medication Sig Dispense Refill  . atorvastatin (LIPITOR) 20 MG tablet Take 1 tablet (20 mg total) by mouth daily. 30 tablet 4  . cyclobenzaprine (FLEXERIL) 10 MG tablet Take 10 mg by mouth daily.    . hyoscyamine (LEVSIN SL) 0.125 MG SL tablet Take 1-2 tablets every 6 hours as needed for crampy abdominal pain. 60 tablet 3  . olmesartan (BENICAR) 20 MG tablet Take 1 tablet (20 mg  total) by mouth daily. 30 tablet 2  . oxyCODONE-acetaminophen (PERCOCET/ROXICET) 5-325 MG per tablet Take 1 tablet by mouth every 4 (four) hours as needed for pain.    . pantoprazole (PROTONIX) 40 MG tablet Take 1 tablet (40 mg total) by mouth daily. 30 tablet 11  . pilocarpine (SALAGEN) 5 MG tablet Take 5 mg by mouth 3 (three) times daily. Pt takes 2 tablets, 5 mg, by mouth, three times a day    . propranolol (INNOPRAN XL) 80 MG 24 hr capsule Take 1 capsule (80 mg total) by mouth at bedtime. 90 capsule 3  . sertraline (ZOLOFT) 100 MG tablet TAKE 1.5 TABLETS (150 MG TOTAL) BY MOUTH DAILY. 45 tablet 2  . Vitamin D, Ergocalciferol, (DRISDOL) 50000 units CAPS capsule Take 1 capsule (50,000 Units total) by mouth every 7 (seven) days. 12 capsule 0  . meloxicam (MOBIC) 15 MG tablet Take 1 tablet (15 mg total) by mouth daily. 30  tablet 0  . meloxicam (MOBIC) 7.5 MG tablet Take 7.5 mg by mouth as needed.     Marland Kitchen tiZANidine (ZANAFLEX) 2 MG tablet Take 2 mg by mouth every 8 (eight) hours as needed.     No facility-administered medications prior to visit.     Allergies  Allergen Reactions  . Amoxicillin-Pot Clavulanate     REACTION: Throat swelling.  . Azithromycin     REACTION: Throat Swelling  . Cefazolin     REACTION: Throat Swelling  . Moxifloxacin     REACTION: Throat Swelling  . Penicillins     REACTION: throat swelling  . Sulfonamide Derivatives     Review of Systems  Constitutional: Negative for chills, fever and malaise/fatigue.  HENT: Negative for congestion and hearing loss.   Eyes: Negative for discharge.  Respiratory: Negative for cough, sputum production and shortness of breath.   Cardiovascular: Negative for chest pain, palpitations and leg swelling.  Gastrointestinal: Negative for abdominal pain, blood in stool, constipation, diarrhea, heartburn, nausea and vomiting.  Genitourinary: Negative for dysuria, frequency, hematuria and urgency.  Musculoskeletal: Positive for back pain, joint pain, myalgias and neck pain. Negative for falls.  Skin: Negative for rash.  Neurological: Negative for dizziness, sensory change, loss of consciousness, weakness and headaches.  Endo/Heme/Allergies: Negative for environmental allergies. Does not bruise/bleed easily.  Psychiatric/Behavioral: Positive for depression. Negative for suicidal ideas. The patient is nervous/anxious. The patient does not have insomnia.        Objective:    Physical Exam  Constitutional: She is oriented to person, place, and time. She appears well-developed and well-nourished. No distress.  HENT:  Head: Normocephalic and atraumatic.  Eyes: Conjunctivae are normal.  Neck: Neck supple. No thyromegaly present.  Cardiovascular: Normal rate, regular rhythm and normal heart sounds.   No murmur heard. Pulmonary/Chest: Effort normal and  breath sounds normal. No respiratory distress.  Abdominal: Soft. Bowel sounds are normal. She exhibits no distension and no mass. There is no tenderness.  Musculoskeletal: She exhibits no edema.  Lymphadenopathy:    She has no cervical adenopathy.  Neurological: She is alert and oriented to person, place, and time.  Skin: Skin is warm and dry.  Psychiatric: She has a normal mood and affect. Her behavior is normal.    BP 120/80 (BP Location: Right Arm, Patient Position: Sitting, Cuff Size: Large)   Pulse 86   Temp 98.3 F (36.8 C) (Oral)   Ht 5\' 7"  (1.702 m)   Wt 204 lb 8 oz (92.8 kg)   LMP 09/23/2011  SpO2 97%   BMI 32.03 kg/m  Wt Readings from Last 3 Encounters:  10/28/16 204 lb 8 oz (92.8 kg)  10/01/16 206 lb 3.2 oz (93.5 kg)  04/18/16 203 lb (92.1 kg)     Lab Results  Component Value Date   WBC 8.1 04/18/2016   HGB 13.5 04/18/2016   HCT 40.0 04/18/2016   PLT 278 04/18/2016   GLUCOSE 92 04/18/2016   CHOL 186 04/18/2016   TRIG 399 (H) 04/18/2016   HDL 62 04/18/2016   LDLDIRECT 101.0 11/09/2015   LDLCALC 44 04/18/2016   ALT 23 04/18/2016   AST 18 04/18/2016   NA 138 04/18/2016   K 4.5 04/18/2016   CL 101 04/18/2016   CREATININE 0.69 04/18/2016   BUN 16 04/18/2016   CO2 26 04/18/2016   TSH 3.56 04/18/2016    Lab Results  Component Value Date   TSH 3.56 04/18/2016   Lab Results  Component Value Date   WBC 8.1 04/18/2016   HGB 13.5 04/18/2016   HCT 40.0 04/18/2016   MCV 91.5 04/18/2016   PLT 278 04/18/2016   Lab Results  Component Value Date   NA 138 04/18/2016   K 4.5 04/18/2016   CO2 26 04/18/2016   GLUCOSE 92 04/18/2016   BUN 16 04/18/2016   CREATININE 0.69 04/18/2016   BILITOT 0.4 04/18/2016   ALKPHOS 78 04/18/2016   AST 18 04/18/2016   ALT 23 04/18/2016   PROT 7.3 04/18/2016   ALBUMIN 4.4 04/18/2016   CALCIUM 8.9 04/18/2016   GFR 119.45 03/30/2015   Lab Results  Component Value Date   CHOL 186 04/18/2016   Lab Results  Component  Value Date   HDL 62 04/18/2016   Lab Results  Component Value Date   LDLCALC 44 04/18/2016   Lab Results  Component Value Date   TRIG 399 (H) 04/18/2016   Lab Results  Component Value Date   CHOLHDL 3.0 04/18/2016   No results found for: HGBA1C     Assessment & Plan:   Problem List Items Addressed This Visit    Hypertension    Well controlled, no changes to meds. Encouraged heart healthy diet such as the DASH diet and exercise as tolerated.       Relevant Orders   TSH   CBC   Comprehensive metabolic panel   GERD (gastroesophageal reflux disease)    [facility] recently, doing better at this time. Has done better on Benefiber than on Metamucil      Hyperlipidemia    Tolerating statin, encouraged heart healthy diet, avoid trans fats, minimize simple carbs and saturated fats. Increase exercise as tolerated      Relevant Orders   Lipid panel   Constipation, chronic    Doing well on Benefiber, consider trying a new probiotic      Tachycardia    RRR today      Depression    Very stressed this past couple of months, was contemplating counseling but is feeling better today. Given paperwork on [facility] if she decides to start.       Vitamin D deficiency    Check level today      Relevant Orders   VITAMIN D 25 Hydroxy (Vit-D Deficiency, Fractures)   Arthritis of right hip    Consider Lidocaine.gel or patches. Stay active      Preventative health care    Patient encouraged to maintain heart healthy diet, regular exercise, adequate sleep. Consider daily probiotics. Take medications as  prescribed. Given and reviewed copy of ACP documents from [place] and encouraged to complete and return       Other Visit Diagnoses    Encounter for immunization       Relevant Orders   Flu Vaccine QUAD 36+ mos IM (Completed)      I have discontinued Ms. Giddens tiZANidine, meloxicam, and meloxicam. I am also having her maintain her cyclobenzaprine,  oxyCODONE-acetaminophen, pilocarpine, Vitamin D (Ergocalciferol), atorvastatin, sertraline, olmesartan, propranolol, pantoprazole, and hyoscyamine.  No orders of the defined types were placed in this encounter.    Penni Homans, MD

## 2016-10-30 ENCOUNTER — Other Ambulatory Visit: Payer: Self-pay | Admitting: Family Medicine

## 2016-10-30 DIAGNOSIS — E785 Hyperlipidemia, unspecified: Secondary | ICD-10-CM

## 2016-10-30 MED ORDER — VITAMIN D (ERGOCALCIFEROL) 1.25 MG (50000 UNIT) PO CAPS: 50000.0000 [IU] | ORAL_CAPSULE | ORAL | 0 refills | Status: DC

## 2016-10-30 MED ORDER — ATORVASTATIN CALCIUM 40 MG PO TABS: 40.0000 mg | ORAL_TABLET | Freq: Every day | ORAL | 1 refills | Status: DC

## 2017-01-30 ENCOUNTER — Other Ambulatory Visit: Payer: 59

## 2017-03-26 DIAGNOSIS — M791 Myalgia: Secondary | ICD-10-CM | POA: Diagnosis not present

## 2017-03-26 DIAGNOSIS — Z79899 Other long term (current) drug therapy: Secondary | ICD-10-CM | POA: Diagnosis not present

## 2017-04-09 DIAGNOSIS — Z01419 Encounter for gynecological examination (general) (routine) without abnormal findings: Secondary | ICD-10-CM | POA: Diagnosis not present

## 2017-04-15 DIAGNOSIS — M5030 Other cervical disc degeneration, unspecified cervical region: Secondary | ICD-10-CM | POA: Diagnosis not present

## 2017-04-15 DIAGNOSIS — M3500 Sicca syndrome, unspecified: Secondary | ICD-10-CM | POA: Diagnosis not present

## 2017-04-15 DIAGNOSIS — M25521 Pain in right elbow: Secondary | ICD-10-CM | POA: Diagnosis not present

## 2017-04-24 ENCOUNTER — Other Ambulatory Visit: Payer: Self-pay | Admitting: Family Medicine

## 2017-04-27 DIAGNOSIS — H903 Sensorineural hearing loss, bilateral: Secondary | ICD-10-CM | POA: Diagnosis not present

## 2017-04-30 ENCOUNTER — Encounter: Payer: Self-pay | Admitting: Family Medicine

## 2017-04-30 ENCOUNTER — Ambulatory Visit (INDEPENDENT_AMBULATORY_CARE_PROVIDER_SITE_OTHER): Payer: 59 | Admitting: Family Medicine

## 2017-04-30 DIAGNOSIS — I10 Essential (primary) hypertension: Secondary | ICD-10-CM | POA: Diagnosis not present

## 2017-04-30 DIAGNOSIS — E6609 Other obesity due to excess calories: Secondary | ICD-10-CM

## 2017-04-30 DIAGNOSIS — H9193 Unspecified hearing loss, bilateral: Secondary | ICD-10-CM

## 2017-04-30 DIAGNOSIS — E559 Vitamin D deficiency, unspecified: Secondary | ICD-10-CM

## 2017-04-30 DIAGNOSIS — G8929 Other chronic pain: Secondary | ICD-10-CM | POA: Diagnosis not present

## 2017-04-30 DIAGNOSIS — M1611 Unilateral primary osteoarthritis, right hip: Secondary | ICD-10-CM

## 2017-04-30 DIAGNOSIS — E669 Obesity, unspecified: Secondary | ICD-10-CM

## 2017-04-30 DIAGNOSIS — M542 Cervicalgia: Secondary | ICD-10-CM

## 2017-04-30 DIAGNOSIS — E782 Mixed hyperlipidemia: Secondary | ICD-10-CM

## 2017-04-30 DIAGNOSIS — M353 Polymyalgia rheumatica: Secondary | ICD-10-CM

## 2017-04-30 DIAGNOSIS — H9190 Unspecified hearing loss, unspecified ear: Secondary | ICD-10-CM

## 2017-04-30 DIAGNOSIS — R000 Tachycardia, unspecified: Secondary | ICD-10-CM

## 2017-04-30 HISTORY — DX: Unspecified hearing loss, unspecified ear: H91.90

## 2017-04-30 HISTORY — DX: Obesity, unspecified: E66.9

## 2017-04-30 LAB — TSH: TSH: 3.26 u[IU]/mL (ref 0.35–4.50)

## 2017-04-30 LAB — LIPID PANEL
CHOL/HDL RATIO: 3
Cholesterol: 183 mg/dL (ref 0–200)
HDL: 64.7 mg/dL (ref >39.00)
LDL Cholesterol: 84 mg/dL (ref 0–99)
NONHDL: 118.06
Triglycerides: 172 mg/dL — ABNORMAL HIGH (ref 0.0–149.0)
VLDL: 34.4 mg/dL (ref 0.0–40.0)

## 2017-04-30 LAB — VITAMIN D 25 HYDROXY (VIT D DEFICIENCY, FRACTURES): VITD: 31.25 ng/mL (ref 30.00–100.00)

## 2017-04-30 LAB — COMPREHENSIVE METABOLIC PANEL
ALK PHOS: 78 U/L (ref 39–117)
ALT: 21 U/L (ref 0–35)
AST: 18 U/L (ref 0–37)
Albumin: 4.2 g/dL (ref 3.5–5.2)
BILIRUBIN TOTAL: 0.3 mg/dL (ref 0.2–1.2)
BUN: 16 mg/dL (ref 6–23)
CALCIUM: 9.3 mg/dL (ref 8.4–10.5)
CO2: 26 meq/L (ref 19–32)
CREATININE: 0.63 mg/dL (ref 0.40–1.20)
Chloride: 106 mEq/L (ref 96–112)
GFR: 103.48 mL/min (ref >60.00)
GLUCOSE: 93 mg/dL (ref 70–99)
Potassium: 4.2 mEq/L (ref 3.5–5.1)
Sodium: 139 mEq/L (ref 135–145)
TOTAL PROTEIN: 7.3 g/dL (ref 6.0–8.3)

## 2017-04-30 LAB — CBC
HCT: 39.1 % (ref 36.0–46.0)
HEMOGLOBIN: 13.1 g/dL (ref 12.0–15.0)
MCHC: 33.4 g/dL (ref 30.0–36.0)
MCV: 90.2 fl (ref 78.0–100.0)
PLATELETS: 293 10*3/uL (ref 150.0–400.0)
RBC: 4.34 Mil/uL (ref 3.87–5.11)
RDW: 13.9 % (ref 11.5–15.5)
WBC: 8.2 10*3/uL (ref 4.0–10.5)

## 2017-04-30 NOTE — Assessment & Plan Note (Signed)
Encouraged DASH diet, decrease po intake and increase exercise as tolerated. Needs 7-8 hours of sleep nightly. Avoid trans fats, eat small, frequent meals every 4-5 hours with lean proteins, complex carbs and healthy fats. Minimize simple carbs, bariatric referral 

## 2017-04-30 NOTE — Patient Instructions (Signed)
Carbohydrate Counting for Diabetes Mellitus, Adult Carbohydrate counting is a method for keeping track of how many carbohydrates you eat. Eating carbohydrates naturally increases the amount of sugar (glucose) in the blood. Counting how many carbohydrates you eat helps keep your blood glucose within normal limits, which helps you manage your diabetes (diabetes mellitus). It is important to know how many carbohydrates you can safely have in each meal. This is different for every person. A diet and nutrition specialist (registered dietitian) can help you make a meal plan and calculate how many carbohydrates you should have at each meal and snack. Carbohydrates are found in the following foods:  Grains, such as breads and cereals.  Dried beans and soy products.  Starchy vegetables, such as potatoes, peas, and corn.  Fruit and fruit juices.  Milk and yogurt.  Sweets and snack foods, such as cake, cookies, candy, chips, and soft drinks. How do I count carbohydrates? There are two ways to count carbohydrates in food. You can use either of the methods or a combination of both. Reading "Nutrition Facts" on packaged food  The "Nutrition Facts" list is included on the labels of almost all packaged foods and beverages in the [place] It includes:  The serving size.  Information about nutrients in each serving, including the grams (g) of carbohydrate per serving. To use the "Nutrition Facts":  Decide how many servings you will have.  Multiply the number of servings by the number of carbohydrates per serving.  The resulting number is the total amount of carbohydrates that you will be having. Learning standard serving sizes of other foods  When you eat foods containing carbohydrates that are not packaged or do not include "Nutrition Facts" on the label, you need to measure the servings in order to count the amount of carbohydrates:  Measure the foods that you will eat with a food scale or measuring  cup, if needed.  Decide how many standard-size servings you will eat.  Multiply the number of servings by 15. Most carbohydrate-rich foods have about 15 g of carbohydrates per serving.  For example, if you eat 8 oz (170 g) of strawberries, you will have eaten 2 servings and 30 g of carbohydrates (2 servings x 15 g = 30 g).  For foods that have more than one food mixed, such as soups and casseroles, you must count the carbohydrates in each food that is included. The following list contains standard serving sizes of common carbohydrate-rich foods. Each of these servings has about 15 g of carbohydrates:   hamburger bun or  English muffin.   oz (15 mL) syrup.   oz (14 g) jelly.  1 slice of bread.  1 six-inch tortilla.  3 oz (85 g) cooked rice or pasta.  4 oz (113 g) cooked dried beans.  4 oz (113 g) starchy vegetable, such as peas, corn, or potatoes.  4 oz (113 g) hot cereal.  4 oz (113 g) mashed potatoes or  of a large baked potato.  4 oz (113 g) canned or frozen fruit.  4 oz (120 mL) fruit juice.  4-6 crackers.  6 chicken nuggets.  6 oz (170 g) unsweetened dry cereal.  6 oz (170 g) plain fat-free yogurt or yogurt sweetened with artificial sweeteners.  8 oz (240 mL) milk.  8 oz (170 g) fresh fruit or one small piece of fruit.  24 oz (680 g) popped popcorn. Example of carbohydrate counting Sample meal  3 oz (85 g) chicken breast.  6 oz (  170 g) brown rice.  4 oz (113 g) corn.  8 oz (240 mL) milk.  8 oz (170 g) strawberries with sugar-free whipped topping. Carbohydrate calculation 1. Identify the foods that contain carbohydrates:  Rice.  Corn.  Milk.  Strawberries. 2. Calculate how many servings you have of each food:  2 servings rice.  1 serving corn.  1 serving milk.  1 serving strawberries. 3. Multiply each number of servings by 15 g:  2 servings rice x 15 g = 30 g.  1 serving corn x 15 g = 15 g.  1 serving milk x 15 g = 15  g.  1 serving strawberries x 15 g = 15 g. 4. Add together all of the amounts to find the total grams of carbohydrates eaten:  30 g + 15 g + 15 g + 15 g = 75 g of carbohydrates total. This information is not intended to replace advice given to you by your health care provider. Make sure you discuss any questions you have with your health care provider. Document Released: 12/08/2005 Document Revised: 06/27/2016 Document Reviewed: 05/21/2016 Elsevier Interactive Patient Education  2017 [employer]  

## 2017-04-30 NOTE — Assessment & Plan Note (Signed)
Well controlled, no changes to meds. Encouraged heart healthy diet such as the DASH diet and exercise as tolerated.  °

## 2017-04-30 NOTE — Assessment & Plan Note (Signed)
Manages with heat and staying active. Uses Oxycodone very infrequently due to side effects, follows with pain management. Does use Flexeril at bed time with adequate results

## 2017-04-30 NOTE — Assessment & Plan Note (Addendum)
Recheck level today, continue daily supplements, she does miss a dose occasionally

## 2017-04-30 NOTE — Progress Notes (Signed)
Patient ID: Kathy Huerta, female   DOB: Sep 04, 1961, 56 y.o.   MRN: [id number]   Subjective:  I acted as a Education administrator for Penni Homans, [place], [place]   Patient ID: Kathy Huerta, female    DOB: Jan 16, 1961, 56 y.o.   MRN: [id number]  Chief Complaint  Patient presents with  . Follow-up    42-month F/U from Annual Exam.     HPI  Patient is in today for a 26-month follow up. Patient advises that she saw a ENT Physician on 04/27/2017 was told that she was suffering from moderate hearing loss in both ears. Patient has a Hx of HTN, GERD, DDD, chronic neck pain. Patient has no additional acute concerns noted at this time. No recent febrile illness or hospitalizations. Denies CP/palp/SOB/HA/congestion/fevers/GI or GU c/o. Taking meds as prescribed  Patient Care Team: Mosie Lukes, MD as PCP - General (Family Medicine) Vernie Ammons, MD as Consulting Physician (Dermatology)   Past Medical History:  Diagnosis Date  . Anxiety   . Arthritis of right hip 10/28/2016  . Basal cell carcinoma 11/17/11   Dr. Sammuel Hines  . Colon polyp   . Cutaneous skin tags 07/24/2013  . DDD (degenerative disc disease), cervical    s/p fusion  . DDD (degenerative disc disease), thoracolumbar    on disability  . Depression   . Headache(784.0) 01/29/2013  . Hearing loss    Saw ENT on 04/27/2017; Moderate hearing loss in both ears.  . Hearing loss 04/30/2017  . Heartburn   . HTN (hypertension) 2002  . Hyperlipemia 08/2014  . Obesity   . Obesity 04/30/2017  . Pain in joint, shoulder region 11/20/2013  . Pancreatitis 04/24/2011  . Pneumonia 07/24/2013  . Preventative health care 10/28/2016  . Tachycardia 08/12/2014  . Vitamin D deficiency 11/18/2015    Past Surgical History:  Procedure Laterality Date  . ANTERIOR FUSION CERVICAL SPINE  2008   C6-7  Dr Saintclair Halsted  . CHOLECYSTECTOMY N/A 02/27/2013   Procedure: LAPAROSCOPIC CHOLECYSTECTOMY WITH INTRAOPERATIVE CHOLANGIOGRAM;  Surgeon: Adin Hector, MD;  Location: [facility];   Service: General;  Laterality: N/A;  . POSTERIOR FUSION CERVICAL SPINE  2011   C6-7    Family History  Problem Relation Age of Onset  . Hypertension Mother   . Hyperlipidemia Mother   . Other Father        DDD, colectomy,cardiac stent, afib  . Colon polyps Father   . Hypertension Brother   . Diabetes Maternal Grandmother   . Colon cancer Neg Hx   . Stomach cancer Neg Hx   . Kidney disease Neg Hx     Social History   Social History  . Marital status: Married    Spouse name: N/A  . Number of children: 0  . Years of education: N/A   Occupational History  . Disabled Unemployed   Social History Main Topics  . Smoking status: Never Smoker  . Smokeless tobacco: Never Used  . Alcohol use 3.0 oz/week    5 Cans of beer per week  . Drug use: No  . Sexual activity: Not on file   Other Topics Concern  . Not on file   Social History Narrative   Reviewed history and no changes required.   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   Regular exercise-no   Smoking Status:  quit   Caffeine use/day:  none  Does Patient Exercise:  no          Outpatient Medications Prior to Visit  Medication Sig Dispense Refill  . atorvastatin (LIPITOR) 40 MG tablet TAKE ONE TABLET BY MOUTH DAILY 30 tablet 0  . cyclobenzaprine (FLEXERIL) 10 MG tablet Take 10 mg by mouth daily.    . hyoscyamine (LEVSIN SL) 0.125 MG SL tablet Take 1-2 tablets every 6 hours as needed for crampy abdominal pain. 60 tablet 3  . olmesartan (BENICAR) 20 MG tablet Take 1 tablet (20 mg total) by mouth daily. 90 tablet 2  . oxyCODONE-acetaminophen (PERCOCET/ROXICET) 5-325 MG per tablet Take 1 tablet by mouth every 4 (four) hours as needed for pain.    . pantoprazole (PROTONIX) 40 MG tablet Take 1 tablet (40 mg total) by mouth daily. 30 tablet 11  . pilocarpine (SALAGEN) 5 MG tablet Take 5 mg by mouth 3 (three) times daily.  Pt takes 2 tablets, 5 mg, by mouth, three times a day    . propranolol (INNOPRAN XL) 80 MG 24 hr capsule Take 1 capsule (80 mg total) by mouth at bedtime. 90 capsule 3  . sertraline (ZOLOFT) 100 MG tablet TAKE 1.5 TABLETS (150 MG TOTAL) BY MOUTH DAILY. 45 tablet 2  . Vitamin D, Ergocalciferol, (DRISDOL) 50000 units CAPS capsule Take 1 capsule (50,000 Units total) by mouth every 7 (seven) days. 12 capsule 0   No facility-administered medications prior to visit.     Allergies  Allergen Reactions  . Amoxicillin-Pot Clavulanate     REACTION: Throat swelling.  . Azithromycin     REACTION: Throat Swelling  . Cefazolin     REACTION: Throat Swelling  . Moxifloxacin     REACTION: Throat Swelling  . Penicillins     REACTION: throat swelling  . Sulfonamide Derivatives     Review of Systems  Constitutional: Negative for fever and malaise/fatigue.  HENT: Positive for hearing loss. Negative for congestion.        Moderate hearing loss in both ears.  Eyes: Negative for blurred vision.  Respiratory: Negative for cough and shortness of breath.   Cardiovascular: Negative for chest pain, palpitations and leg swelling.  Gastrointestinal: Negative for vomiting.  Musculoskeletal: Negative for back pain.  Skin: Negative for rash.  Neurological: Negative for loss of consciousness and headaches.       Objective:    Physical Exam  Constitutional: She is oriented to person, place, and time. She appears well-developed and well-nourished. No distress.  HENT:  Head: Normocephalic and atraumatic.  Eyes: Conjunctivae are normal.  Neck: Normal range of motion. No thyromegaly present.  Cardiovascular: Normal rate and regular rhythm.   Pulmonary/Chest: Effort normal and breath sounds normal. She has no wheezes.  Abdominal: Soft. Bowel sounds are normal. There is no tenderness.  Musculoskeletal: She exhibits no edema or deformity.  Neurological: She is alert and oriented to person, place, and time.    Skin: Skin is warm and dry. She is not diaphoretic.  Psychiatric: She has a normal mood and affect.    BP 104/76 (BP Location: Left Arm, Patient Position: Sitting, Cuff Size: Normal)   Pulse 80   Temp 98.6 F (37 C) (Oral)   Wt 201 lb 9.6 oz (91.4 kg)   LMP 09/23/2011   SpO2 99% Comment: RA  BMI 31.58 kg/m  Wt Readings from Last 3 Encounters:  04/30/17 201 lb 9.6 oz (91.4 kg)  10/28/16 204 lb 8 oz (92.8 kg)  10/01/16 206 lb 3.2 oz (93.5 kg)  BP Readings from Last 3 Encounters:  04/30/17 104/76  10/28/16 120/80  10/01/16 122/80     Immunization History  Administered Date(s) Administered  . Influenza Split 10/17/2011  . Influenza Whole 10/07/2010  . Influenza,inj,Quad PF,36+ Mos 10/26/2013, 10/02/2014, 11/09/2015, 10/28/2016  . Pneumococcal Conjugate-13 11/15/2013  . Tdap 06/03/2010    Health Maintenance  Topic Date Due  . Hepatitis C Screening  [birth year]  . HIV Screening  03/23/1975  . INFLUENZA VACCINE  07/22/2017  . MAMMOGRAM  04/29/2018  . PAP SMEAR  04/09/2019  . TETANUS/TDAP  06/03/2020  . COLONOSCOPY  05/21/2022    Lab Results  Component Value Date   WBC 8.2 04/30/2017   HGB 13.1 04/30/2017   HCT 39.1 04/30/2017   PLT 293.0 04/30/2017   GLUCOSE 93 04/30/2017   CHOL 183 04/30/2017   TRIG 172.0 (H) 04/30/2017   HDL 64.70 04/30/2017   LDLDIRECT 92.0 10/28/2016   LDLCALC 84 04/30/2017   ALT 21 04/30/2017   AST 18 04/30/2017   NA 139 04/30/2017   K 4.2 04/30/2017   CL 106 04/30/2017   CREATININE 0.63 04/30/2017   BUN 16 04/30/2017   CO2 26 04/30/2017   TSH 3.26 04/30/2017    Lab Results  Component Value Date   TSH 3.26 04/30/2017   Lab Results  Component Value Date   WBC 8.2 04/30/2017   HGB 13.1 04/30/2017   HCT 39.1 04/30/2017   MCV 90.2 04/30/2017   PLT 293.0 04/30/2017   Lab Results  Component Value Date   NA 139 04/30/2017   K 4.2 04/30/2017   CO2 26 04/30/2017   GLUCOSE 93 04/30/2017   BUN 16 04/30/2017   CREATININE 0.63  04/30/2017   BILITOT 0.3 04/30/2017   ALKPHOS 78 04/30/2017   AST 18 04/30/2017   ALT 21 04/30/2017   PROT 7.3 04/30/2017   ALBUMIN 4.2 04/30/2017   CALCIUM 9.3 04/30/2017   GFR 103.48 04/30/2017   Lab Results  Component Value Date   CHOL 183 04/30/2017   Lab Results  Component Value Date   HDL 64.70 04/30/2017   Lab Results  Component Value Date   LDLCALC 84 04/30/2017   Lab Results  Component Value Date   TRIG 172.0 (H) 04/30/2017   Lab Results  Component Value Date   CHOLHDL 3 04/30/2017   No results found for: HGBA1C       Assessment & Plan:   Problem List Items Addressed This Visit    Hypertension    Well controlled, no changes to meds. Encouraged heart healthy diet such as the DASH diet and exercise as tolerated.       Relevant Medications   propranolol ER (INDERAL LA) 80 MG 24 hr capsule   Other Relevant Orders   CBC (Completed)   Comprehensive metabolic panel (Completed)   TSH (Completed)   Chronic neck pain    Manages with heat and staying active. Uses Oxycodone very infrequently due to side effects, follows with pain management. Does use Flexeril at bed time with adequate results      Hyperlipidemia    Tolerating statin, encouraged heart healthy diet, avoid trans fats, minimize simple carbs and saturated fats. Increase exercise as tolerated      Relevant Medications   propranolol ER (INDERAL LA) 80 MG 24 hr capsule   Other Relevant Orders   Lipid panel (Completed)   Polymyalgia (HCC)    Pain controlled at this time.       Tachycardia  RRR today      Vitamin D deficiency    Recheck level today, continue daily supplements, she does miss a dose occasionally      Relevant Orders   VITAMIN D 25 Hydroxy (Vit-D Deficiency, Fractures) (Completed)   Arthritis of right hip    Pain improved. No concerns today      Hearing loss    Discovered when her husband complained. she saw ENT and they reported sensorineuroal loss worse on Left than  right. She was offered hearing aides but declines for now. saw [facility]      Obesity    Encouraged DASH diet, decrease po intake and increase exercise as tolerated. Needs 7-8 hours of sleep nightly. Avoid trans fats, eat small, frequent meals every 4-5 hours with lean proteins, complex carbs and healthy fats. Minimize simple carbs, bariatric referral          I am having Ms. Ueda maintain her cyclobenzaprine, oxyCODONE-acetaminophen, pilocarpine, sertraline, propranolol, pantoprazole, hyoscyamine, olmesartan, Vitamin D (Ergocalciferol), atorvastatin, and propranolol ER.  Meds ordered this encounter  Medications  . propranolol ER (INDERAL LA) 80 MG 24 hr capsule    CMA served as scribe during this visit. History, Physical and Plan performed by medical provider. Documentation and orders reviewed and attested to.  Penni Homans, MD

## 2017-04-30 NOTE — Progress Notes (Signed)
Pre visit review using our clinic review tool, if applicable. No additional management support is needed unless otherwise documented below in the visit note. 

## 2017-04-30 NOTE — Assessment & Plan Note (Signed)
Pain improved. No concerns today

## 2017-04-30 NOTE — Assessment & Plan Note (Signed)
RRR today 

## 2017-04-30 NOTE — Assessment & Plan Note (Addendum)
Discovered when her husband complained. she saw ENT and they reported sensorineuroal loss worse on Left than right. She was offered hearing aides but declines for now. saw [facility]

## 2017-04-30 NOTE — Assessment & Plan Note (Signed)
Tolerating statin, encouraged heart healthy diet, avoid trans fats, minimize simple carbs and saturated fats. Increase exercise as tolerated 

## 2017-05-03 NOTE — Assessment & Plan Note (Signed)
Pain controlled at this time.

## 2017-05-09 ENCOUNTER — Other Ambulatory Visit: Payer: Self-pay | Admitting: Family Medicine

## 2017-05-11 ENCOUNTER — Telehealth: Payer: Self-pay | Admitting: Family Medicine

## 2017-05-11 MED ORDER — SERTRALINE HCL 100 MG PO TABS: ORAL_TABLET | ORAL | 1 refills | Status: DC

## 2017-05-11 NOTE — Telephone Encounter (Signed)
1.5 tabs po daily please

## 2017-05-11 NOTE — Telephone Encounter (Signed)
There are 2 sets of instructions on recent sertraline---clarify which is correct and I will send in new prescription.

## 2017-05-11 NOTE — Telephone Encounter (Signed)
Updated list and sent in to pharmacy correct prescription

## 2017-06-16 DIAGNOSIS — M961 Postlaminectomy syndrome, not elsewhere classified: Secondary | ICD-10-CM | POA: Diagnosis not present

## 2017-06-16 DIAGNOSIS — M609 Myositis, unspecified: Secondary | ICD-10-CM | POA: Diagnosis not present

## 2017-07-09 ENCOUNTER — Other Ambulatory Visit: Payer: Self-pay | Admitting: Family Medicine

## 2017-07-25 ENCOUNTER — Other Ambulatory Visit: Payer: Self-pay | Admitting: Family Medicine

## 2017-07-30 DIAGNOSIS — M25521 Pain in right elbow: Secondary | ICD-10-CM | POA: Diagnosis not present

## 2017-07-30 DIAGNOSIS — M3500 Sicca syndrome, unspecified: Secondary | ICD-10-CM | POA: Diagnosis not present

## 2017-07-30 DIAGNOSIS — M5030 Other cervical disc degeneration, unspecified cervical region: Secondary | ICD-10-CM | POA: Diagnosis not present

## 2017-08-09 ENCOUNTER — Other Ambulatory Visit: Payer: Self-pay | Admitting: Family Medicine

## 2017-08-13 DIAGNOSIS — G8929 Other chronic pain: Secondary | ICD-10-CM | POA: Diagnosis not present

## 2017-08-13 DIAGNOSIS — M25551 Pain in right hip: Secondary | ICD-10-CM | POA: Diagnosis not present

## 2017-08-21 ENCOUNTER — Encounter: Payer: Self-pay | Admitting: Internal Medicine

## 2017-08-21 DIAGNOSIS — G8929 Other chronic pain: Secondary | ICD-10-CM | POA: Diagnosis not present

## 2017-08-21 DIAGNOSIS — M25551 Pain in right hip: Secondary | ICD-10-CM | POA: Diagnosis not present

## 2017-08-26 DIAGNOSIS — M1611 Unilateral primary osteoarthritis, right hip: Secondary | ICD-10-CM | POA: Diagnosis not present

## 2017-09-02 DIAGNOSIS — M1611 Unilateral primary osteoarthritis, right hip: Secondary | ICD-10-CM | POA: Diagnosis not present

## 2017-09-08 DIAGNOSIS — M961 Postlaminectomy syndrome, not elsewhere classified: Secondary | ICD-10-CM | POA: Diagnosis not present

## 2017-09-08 DIAGNOSIS — Z79899 Other long term (current) drug therapy: Secondary | ICD-10-CM | POA: Diagnosis not present

## 2017-09-08 DIAGNOSIS — M791 Myalgia: Secondary | ICD-10-CM | POA: Diagnosis not present

## 2017-09-23 DIAGNOSIS — M1611 Unilateral primary osteoarthritis, right hip: Secondary | ICD-10-CM | POA: Diagnosis not present

## 2017-10-10 ENCOUNTER — Other Ambulatory Visit: Payer: Self-pay | Admitting: Family Medicine

## 2017-10-14 DIAGNOSIS — M5030 Other cervical disc degeneration, unspecified cervical region: Secondary | ICD-10-CM | POA: Diagnosis not present

## 2017-10-14 DIAGNOSIS — M25521 Pain in right elbow: Secondary | ICD-10-CM | POA: Diagnosis not present

## 2017-10-14 DIAGNOSIS — M3500 Sicca syndrome, unspecified: Secondary | ICD-10-CM | POA: Diagnosis not present

## 2017-10-22 ENCOUNTER — Other Ambulatory Visit: Payer: Self-pay | Admitting: Internal Medicine

## 2017-10-27 ENCOUNTER — Other Ambulatory Visit: Payer: Self-pay | Admitting: Family Medicine

## 2017-10-28 ENCOUNTER — Other Ambulatory Visit: Payer: Self-pay | Admitting: Family Medicine

## 2017-11-06 ENCOUNTER — Ambulatory Visit (INDEPENDENT_AMBULATORY_CARE_PROVIDER_SITE_OTHER): Payer: 59 | Admitting: Family Medicine

## 2017-11-06 ENCOUNTER — Encounter: Payer: Self-pay | Admitting: Family Medicine

## 2017-11-06 VITALS — BP 100/66 | HR 80 | Temp 97.8°F | Resp 16 | Ht 66.54 in | Wt 200.2 lb

## 2017-11-06 DIAGNOSIS — Z0000 Encounter for general adult medical examination without abnormal findings: Secondary | ICD-10-CM | POA: Diagnosis not present

## 2017-11-06 DIAGNOSIS — I10 Essential (primary) hypertension: Secondary | ICD-10-CM

## 2017-11-06 DIAGNOSIS — E782 Mixed hyperlipidemia: Secondary | ICD-10-CM

## 2017-11-06 DIAGNOSIS — E6609 Other obesity due to excess calories: Secondary | ICD-10-CM | POA: Diagnosis not present

## 2017-11-06 DIAGNOSIS — E559 Vitamin D deficiency, unspecified: Secondary | ICD-10-CM | POA: Diagnosis not present

## 2017-11-06 DIAGNOSIS — N951 Menopausal and female climacteric states: Secondary | ICD-10-CM | POA: Diagnosis not present

## 2017-11-06 DIAGNOSIS — R61 Generalized hyperhidrosis: Secondary | ICD-10-CM

## 2017-11-06 LAB — COMPREHENSIVE METABOLIC PANEL
ALBUMIN: 4.4 g/dL (ref 3.5–5.2)
ALT: 24 U/L (ref 0–35)
AST: 21 U/L (ref 0–37)
Alkaline Phosphatase: 82 U/L (ref 39–117)
BILIRUBIN TOTAL: 0.6 mg/dL (ref 0.2–1.2)
BUN: 11 mg/dL (ref 6–23)
CALCIUM: 9.9 mg/dL (ref 8.4–10.5)
CO2: 28 mEq/L (ref 19–32)
CREATININE: 0.65 mg/dL (ref 0.40–1.20)
Chloride: 106 mEq/L (ref 96–112)
GFR: 99.63 mL/min (ref >60.00)
Glucose, Bld: 109 mg/dL — ABNORMAL HIGH (ref 70–99)
Potassium: 4.5 mEq/L (ref 3.5–5.1)
Sodium: 143 mEq/L (ref 135–145)
Total Protein: 7.5 g/dL (ref 6.0–8.3)

## 2017-11-06 LAB — CBC
HCT: 41.9 % (ref 36.0–46.0)
Hemoglobin: 13.7 g/dL (ref 12.0–15.0)
MCHC: 32.8 g/dL (ref 30.0–36.0)
MCV: 94.2 fl (ref 78.0–100.0)
PLATELETS: 321 10*3/uL (ref 150.0–400.0)
RBC: 4.44 Mil/uL (ref 3.87–5.11)
RDW: 13.4 % (ref 11.5–15.5)
WBC: 6.5 10*3/uL (ref 4.0–10.5)

## 2017-11-06 LAB — LIPID PANEL
CHOLESTEROL: 181 mg/dL (ref 0–200)
HDL: 66.2 mg/dL (ref >39.00)
LDL Cholesterol: 78 mg/dL (ref 0–99)
NonHDL: 115.19
TRIGLYCERIDES: 187 mg/dL — AB (ref 0.0–149.0)
Total CHOL/HDL Ratio: 3
VLDL: 37.4 mg/dL (ref 0.0–40.0)

## 2017-11-06 LAB — VITAMIN D 25 HYDROXY (VIT D DEFICIENCY, FRACTURES): VITD: 31.22 ng/mL (ref 30.00–100.00)

## 2017-11-06 LAB — TSH: TSH: 4.46 u[IU]/mL (ref 0.35–4.50)

## 2017-11-06 MED ORDER — ALPRAZOLAM 0.25 MG PO TABS: 0.2500 mg | ORAL_TABLET | Freq: Two times a day (BID) | ORAL | 0 refills | Status: DC | PRN

## 2017-11-06 NOTE — Patient Instructions (Addendum)
Alprazolam can try 1/2 tab up to 2 tabs at a time as needed for anxiety and hyperhidrosis Preventive Care 40-64 Years, Female Preventive care refers to lifestyle choices and visits with your health care provider that can promote health and wellness. What does preventive care include?  A yearly physical exam. This is also called an annual well check.  Dental exams once or twice a year.  Routine eye exams. Ask your health care provider how often you should have your eyes checked.  Personal lifestyle choices, including: ? Daily care of your teeth and gums. ? Regular physical activity. ? Eating a healthy diet. ? Avoiding tobacco and drug use. ? Limiting alcohol use. ? Practicing safe sex. ? Taking low-dose aspirin daily starting at age 66. ? Taking vitamin and mineral supplements as recommended by your health care provider. What happens during an annual well check? The services and screenings done by your health care provider during your annual well check will depend on your age, overall health, lifestyle risk factors, and family history of disease. Counseling Your health care provider may ask you questions about your:  Alcohol use.  Tobacco use.  Drug use.  Emotional well-being.  Home and relationship well-being.  Sexual activity.  Eating habits.  Work and work Statistician.  Method of birth control.  Menstrual cycle.  Pregnancy history.  Screening You may have the following tests or measurements:  Height, weight, and BMI.  Blood pressure.  Lipid and cholesterol levels. These may be checked every 5 years, or more frequently if you are over 55 years old.  Skin check.  Lung cancer screening. You may have this screening every year starting at age 66 if you have a 30-pack-year history of smoking and currently smoke or have quit within the past 15 years.  Fecal occult blood test (FOBT) of the stool. You may have this test every year starting at age 11.  Flexible  sigmoidoscopy or colonoscopy. You may have a sigmoidoscopy every 5 years or a colonoscopy every 10 years starting at age 42.  Hepatitis C blood test.  Hepatitis B blood test.  Sexually transmitted disease (STD) testing.  Diabetes screening. This is done by checking your blood sugar (glucose) after you have not eaten for a while (fasting). You may have this done every 1-3 years.  Mammogram. This may be done every 1-2 years. Talk to your health care provider about when you should start having regular mammograms. This may depend on whether you have a family history of breast cancer.  BRCA-related cancer screening. This may be done if you have a family history of breast, ovarian, tubal, or peritoneal cancers.  Pelvic exam and Pap test. This may be done every 3 years starting at age 52. Starting at age 73, this may be done every 5 years if you have a Pap test in combination with an HPV test.  Bone density scan. This is done to screen for osteoporosis. You may have this scan if you are at high risk for osteoporosis.  Discuss your test results, treatment options, and if necessary, the need for more tests with your health care provider. Vaccines Your health care provider may recommend certain vaccines, such as:  Influenza vaccine. This is recommended every year.  Tetanus, diphtheria, and acellular pertussis (Tdap, Td) vaccine. You may need a Td booster every 10 years.  Varicella vaccine. You may need this if you have not been vaccinated.  Zoster vaccine. You may need this after age 57.  Measles, mumps,  and rubella (MMR) vaccine. You may need at least one dose of MMR if you were born in 1957 or later. You may also need a second dose.  Pneumococcal 13-valent conjugate (PCV13) vaccine. You may need this if you have certain conditions and were not previously vaccinated.  Pneumococcal polysaccharide (PPSV23) vaccine. You may need one or two doses if you smoke cigarettes or if you have certain  conditions.  Meningococcal vaccine. You may need this if you have certain conditions.  Hepatitis A vaccine. You may need this if you have certain conditions or if you travel or work in places where you may be exposed to hepatitis A.  Hepatitis B vaccine. You may need this if you have certain conditions or if you travel or work in places where you may be exposed to hepatitis B.  Haemophilus influenzae type b (Hib) vaccine. You may need this if you have certain conditions.  Talk to your health care provider about which screenings and vaccines you need and how often you need them. This information is not intended to replace advice given to you by your health care provider. Make sure you discuss any questions you have with your health care provider. Document Released: 01/04/2016 Document Revised: 08/27/2016 Document Reviewed: 10/09/2015 Elsevier Interactive Patient Education  2017 [employer].

## 2017-11-06 NOTE — Progress Notes (Signed)
Subjective:  I acted as a Education administrator for [employer]. Yancey Flemings, [place]   Patient ID: Kathy Huerta, female    DOB: 03-27-1961, 57 y.o.   MRN: [id number]  Chief Complaint  Patient presents with  . Annual Exam    HPI  Patient is in today for annual exam and follow up on chronic medical concerns. Is staying active but is struggling with hyperhidrosis from the neck up. She notes it happens any time but worse if she tries to go out in the world and now she does not go out as much. She is hoping to treat that. She is using very little pain med anymore. She does not have any need as she has become more active. Denies CP/palp/SOB/HA/congestion/fevers/GI or GU c/o. Taking meds as prescribed Patient Care Team: Mosie Lukes, MD as PCP - General (Family Medicine) Vernie Ammons, MD as Consulting Physician (Dermatology)   Past Medical History:  Diagnosis Date  . Anxiety   . Arthritis of right hip 10/28/2016  . Basal cell carcinoma 11/17/11   Dr. Sammuel Hines  . Colon polyp   . Cutaneous skin tags 07/24/2013  . DDD (degenerative disc disease), cervical    s/p fusion  . DDD (degenerative disc disease), thoracolumbar    on disability  . Depression   . Headache(784.0) 01/29/2013  . Hearing loss    Saw ENT on 04/27/2017; Moderate hearing loss in both ears.  . Hearing loss 04/30/2017  . Heartburn   . HTN (hypertension) 2002  . Hyperlipemia 08/2014  . Obesity   . Obesity 04/30/2017  . Pain in joint, shoulder region 11/20/2013  . Pancreatitis 04/24/2011  . Pneumonia 07/24/2013  . Preventative health care 10/28/2016  . Tachycardia 08/12/2014  . Vitamin D deficiency 11/18/2015    Past Surgical History:  Procedure Laterality Date  . ANTERIOR FUSION CERVICAL SPINE  2008   C6-7  Dr Saintclair Halsted  . LAPAROSCOPIC CHOLECYSTECTOMY WITH INTRAOPERATIVE CHOLANGIOGRAM N/A 02/27/2013   Performed by Adin Hector., MD at [facility]  . POSTERIOR FUSION CERVICAL SPINE  2011   C6-7    Family History  Problem Relation Age of Onset    . Hypertension Mother   . Hyperlipidemia Mother   . Other Father        DDD, colectomy,cardiac stent, afib  . Colon polyps Father   . Hypertension Brother   . Diabetes Maternal Grandmother   . Colon cancer Neg Hx   . Stomach cancer Neg Hx   . Kidney disease Neg Hx     Social History   Socioeconomic History  . Marital status: Married    Spouse name: Not on file  . Number of children: 0  . Years of education: Not on file  . Highest education level: Not on file  Social Needs  . Financial resource strain: Not on file  . Food insecurity - worry: Not on file  . Food insecurity - inability: Not on file  . Transportation needs - medical: Not on file  . Transportation needs - non-medical: Not on file  Occupational History  . Occupation: Disabled    Employer: UNEMPLOYED  Tobacco Use  . Smoking status: Never Smoker  . Smokeless tobacco: Never Used  Substance and Sexual Activity  . Alcohol use: Yes    Alcohol/week: 3.0 oz    Types: 5 Cans of beer per week  . Drug use: No  . Sexual activity: Not on file  Other Topics Concern  . Not on file  Social History  Narrative   Reviewed history and no changes required.   Occupation: Disabled,  has worked in the past for a [employer] (medical))   Married   Alcohol use-yes- every weekend 4-5 beers.   Regular exercise-no   Smoking Status:  quit   Caffeine use/day:  none   Does Patient Exercise:  no          Outpatient Medications Prior to Visit  Medication Sig Dispense Refill  . atorvastatin (LIPITOR) 40 MG tablet TAKE ONE TABLET BY MOUTH DAILY 30 tablet 0  . cyclobenzaprine (FLEXERIL) 10 MG tablet Take 10 mg by mouth daily.    . hyoscyamine (LEVSIN SL) 0.125 MG SL tablet TAKE 1 TO 2 TABLETS BY MOUTH EVERY 6 HOURS AS NEEDED FOR CRAMPY ABDOMINAL PAIN 60 tablet 0  . olmesartan (BENICAR) 20 MG tablet TAKE ONE TABLET BY MOUTH DAILY 30 tablet 0  . oxyCODONE-acetaminophen (PERCOCET/ROXICET) 5-325 MG per tablet Take 1 tablet by  mouth every 4 (four) hours as needed for pain.    . pantoprazole (PROTONIX) 40 MG tablet Take 1 tablet (40 mg total) by mouth daily. 30 tablet 11  . pilocarpine (SALAGEN) 5 MG tablet Take 5 mg by mouth 3 (three) times daily. Pt takes 2 tablets, 5 mg, by mouth, three times a day    . propranolol ER (INDERAL LA) 80 MG 24 hr capsule TAKE ONE CAPSULE BY MOUTH EVERY NIGHT AT BEDTIME (Patient not taking: Reported on 11/06/2017) 30 capsule 2  . sertraline (ZOLOFT) 100 MG tablet TAKE 1(1/2) TABLETS BY MOUTH DAILY 45 tablet 0  . Vitamin D, Ergocalciferol, (DRISDOL) 50000 units CAPS capsule Take 1 capsule (50,000 Units total) by mouth every 7 (seven) days. 12 capsule 0  . propranolol ER (INDERAL LA) 80 MG 24 hr capsule      No facility-administered medications prior to visit.     Allergies  Allergen Reactions  . Amoxicillin-Pot Clavulanate     REACTION: Throat swelling.  . Azithromycin     REACTION: Throat Swelling  . Cefazolin     REACTION: Throat Swelling  . Moxifloxacin     REACTION: Throat Swelling  . Penicillins     REACTION: throat swelling  . Sulfonamide Derivatives     Review of Systems  Constitutional: Negative for fever and malaise/fatigue.  HENT: Negative for congestion.   Respiratory: Negative for cough and shortness of breath.   Cardiovascular: Negative for chest pain and palpitations.  Gastrointestinal: Negative for blood in stool and vomiting.  Musculoskeletal: Negative for back pain.  Skin: Negative for rash.  Neurological: Negative for loss of consciousness and headaches.       Objective:    Physical Exam  Constitutional: She is oriented to person, place, and time. She appears well-developed and well-nourished. No distress.  HENT:  Head: Normocephalic and atraumatic.  Eyes: Conjunctivae are normal.  Neck: Normal range of motion. Neck supple. No thyromegaly present.  Cardiovascular: Normal rate, regular rhythm and normal heart sounds.  No murmur  heard. Pulmonary/Chest: Effort normal and breath sounds normal. No respiratory distress. She has no wheezes.  Abdominal: Soft. Bowel sounds are normal. She exhibits no distension and no mass. There is no tenderness.  Musculoskeletal: Normal range of motion. She exhibits no edema or deformity.  Lymphadenopathy:    She has no cervical adenopathy.  Neurological: She is alert and oriented to person, place, and time.  Skin: Skin is warm and dry. She is not diaphoretic.  Psychiatric: She has a normal mood and  affect. Her behavior is normal.    BP 100/66 (BP Location: Right Arm, Patient Position: Sitting, Cuff Size: Small)   Pulse 80   Temp 97.8 F (36.6 C) (Oral)   Resp 16   Ht 5' 6.54" (1.69 m)   Wt 200 lb 3.2 oz (90.8 kg)   LMP 09/23/2011   SpO2 97%   BMI 31.80 kg/m  Wt Readings from Last 3 Encounters:  11/06/17 200 lb 3.2 oz (90.8 kg)  04/30/17 201 lb 9.6 oz (91.4 kg)  10/28/16 204 lb 8 oz (92.8 kg)   BP Readings from Last 3 Encounters:  11/06/17 100/66  04/30/17 104/76  10/28/16 120/80     Immunization History  Administered Date(s) Administered  . Influenza Split 10/17/2011  . Influenza Whole 10/07/2010  . Influenza,inj,Quad PF,6+ Mos 10/26/2013, 10/02/2014, 11/09/2015, 10/28/2016  . Pneumococcal Conjugate-13 11/15/2013  . Tdap 06/03/2010    Health Maintenance  Topic Date Due  . Hepatitis C Screening  06/29/1961  . HIV Screening  03/23/1975  . MAMMOGRAM  04/29/2018  . PAP SMEAR  04/09/2019  . TETANUS/TDAP  06/03/2020  . COLONOSCOPY  05/21/2022  . INFLUENZA VACCINE  Completed    Lab Results  Component Value Date   WBC 6.5 11/06/2017   HGB 13.7 11/06/2017   HCT 41.9 11/06/2017   PLT 321.0 11/06/2017   GLUCOSE 109 (H) 11/06/2017   CHOL 181 11/06/2017   TRIG 187.0 (H) 11/06/2017   HDL 66.20 11/06/2017   LDLDIRECT 92.0 10/28/2016   LDLCALC 78 11/06/2017   ALT 24 11/06/2017   AST 21 11/06/2017   NA 143 11/06/2017   K 4.5 11/06/2017   CL 106 11/06/2017    CREATININE 0.65 11/06/2017   BUN 11 11/06/2017   CO2 28 11/06/2017   TSH 4.46 11/06/2017    Lab Results  Component Value Date   TSH 4.46 11/06/2017   Lab Results  Component Value Date   WBC 6.5 11/06/2017   HGB 13.7 11/06/2017   HCT 41.9 11/06/2017   MCV 94.2 11/06/2017   PLT 321.0 11/06/2017   Lab Results  Component Value Date   NA 143 11/06/2017   K 4.5 11/06/2017   CO2 28 11/06/2017   GLUCOSE 109 (H) 11/06/2017   BUN 11 11/06/2017   CREATININE 0.65 11/06/2017   BILITOT 0.6 11/06/2017   ALKPHOS 82 11/06/2017   AST 21 11/06/2017   ALT 24 11/06/2017   PROT 7.5 11/06/2017   ALBUMIN 4.4 11/06/2017   CALCIUM 9.9 11/06/2017   GFR 99.63 11/06/2017   Lab Results  Component Value Date   CHOL 181 11/06/2017   Lab Results  Component Value Date   HDL 66.20 11/06/2017   Lab Results  Component Value Date   LDLCALC 78 11/06/2017   Lab Results  Component Value Date   TRIG 187.0 (H) 11/06/2017   Lab Results  Component Value Date   CHOLHDL 3 11/06/2017   No results found for: HGBA1C       Assessment & Plan:   Problem List Items Addressed This Visit    Hypertension    Well controlled, no changes to meds. Encouraged heart healthy diet such as the DASH diet and exercise as tolerated.       Hyperlipidemia - Primary    Tolerating statin, encouraged heart healthy diet, avoid trans fats, minimize simple carbs and saturated fats. Increase exercise as tolerated      Relevant Orders   Lipid panel (Completed)   Vitamin D deficiency  Check level today      Relevant Orders   VITAMIN D 25 Hydroxy (Vit-D Deficiency, Fractures) (Completed)   Preventative health care    Patient encouraged to maintain heart healthy diet, regular exercise, adequate sleep. Consider daily probiotics. Take medications as prescribed. Labs ordered. Has a HCP and living will agrees to bring [place] a copy      Relevant Orders   CBC (Completed)   Comprehensive metabolic panel (Completed)    Lipid panel (Completed)   TSH (Completed)   Obesity    Encouraged DASH diet, decrease po intake and increase exercise as tolerated. Needs 7-8 hours of sleep nightly. Avoid trans fats, eat small, frequent meals every 4-5 hours with lean proteins, complex carbs and healthy fats. Minimize simple carbs, GMO foods, consider bariatric referral      Postmenopausal hyperhidrosis    from head up. Is intermittent and worse with stress given rx for Alprazolam to use pren and reevaluate at next visit.          I am having Kathy Huerta start on ALPRAZolam. I am also having her maintain her cyclobenzaprine, oxyCODONE-acetaminophen, pilocarpine, pantoprazole, Vitamin D (Ergocalciferol), propranolol ER, atorvastatin, hyoscyamine, sertraline, and olmesartan.  Meds ordered this encounter  Medications  . ALPRAZolam (XANAX) 0.25 MG tablet    Sig: Take 1 tablet (0.25 mg total) 2 (two) times daily as needed by mouth for anxiety.    Dispense:  20 tablet    Refill:  0    CMA served as scribe during this visit. History, Physical and Plan performed by medical provider. Documentation and orders reviewed and attested to.  Penni Homans, MD

## 2017-11-06 NOTE — Assessment & Plan Note (Signed)
Check level today 

## 2017-11-06 NOTE — Assessment & Plan Note (Signed)
Encouraged DASH diet, decrease po intake and increase exercise as tolerated. Needs 7-8 hours of sleep nightly. Avoid trans fats, eat small, frequent meals every 4-5 hours with lean proteins, complex carbs and healthy fats. Minimize simple carbs, GMO foods, consider bariatric referral

## 2017-11-06 NOTE — Assessment & Plan Note (Addendum)
Patient encouraged to maintain heart healthy diet, regular exercise, adequate sleep. Consider daily probiotics. Take medications as prescribed. Labs ordered. Has a HCP and living will agrees to bring [place] a copy

## 2017-11-08 ENCOUNTER — Other Ambulatory Visit: Payer: Self-pay | Admitting: Family Medicine

## 2017-11-08 DIAGNOSIS — N951 Menopausal and female climacteric states: Secondary | ICD-10-CM | POA: Insufficient documentation

## 2017-11-08 NOTE — Assessment & Plan Note (Signed)
Tolerating statin, encouraged heart healthy diet, avoid trans fats, minimize simple carbs and saturated fats. Increase exercise as tolerated 

## 2017-11-08 NOTE — Assessment & Plan Note (Signed)
Well controlled, no changes to meds. Encouraged heart healthy diet such as the DASH diet and exercise as tolerated.  °

## 2017-11-08 NOTE — Assessment & Plan Note (Signed)
from head up. Is intermittent and worse with stress given rx for Alprazolam to use pren and reevaluate at next visit.

## 2017-11-10 ENCOUNTER — Other Ambulatory Visit (INDEPENDENT_AMBULATORY_CARE_PROVIDER_SITE_OTHER): Payer: 59

## 2017-11-10 DIAGNOSIS — R739 Hyperglycemia, unspecified: Secondary | ICD-10-CM

## 2017-11-10 LAB — HEMOGLOBIN A1C: HEMOGLOBIN A1C: 5.5 % (ref 4.6–6.5)

## 2017-11-16 ENCOUNTER — Other Ambulatory Visit: Payer: Self-pay | Admitting: Family Medicine

## 2017-11-25 ENCOUNTER — Other Ambulatory Visit: Payer: Self-pay | Admitting: Family Medicine

## 2017-11-26 ENCOUNTER — Encounter: Payer: Self-pay | Admitting: *Deleted

## 2017-12-01 ENCOUNTER — Ambulatory Visit: Payer: 59 | Admitting: Internal Medicine

## 2017-12-09 ENCOUNTER — Other Ambulatory Visit: Payer: Self-pay | Admitting: Family Medicine

## 2017-12-09 DIAGNOSIS — Z79899 Other long term (current) drug therapy: Secondary | ICD-10-CM

## 2017-12-11 ENCOUNTER — Other Ambulatory Visit: Payer: Self-pay | Admitting: Family Medicine

## 2017-12-11 MED ORDER — ALPRAZOLAM 0.25 MG PO TABS: 0.2500 mg | ORAL_TABLET | Freq: Two times a day (BID) | ORAL | 1 refills | Status: DC | PRN

## 2017-12-11 NOTE — Telephone Encounter (Signed)
Printed contract  Patient made aware of UDS  Will come in on Monday

## 2017-12-11 NOTE — Telephone Encounter (Signed)
Pt is requesting refill on alprazolam 0.25mg   Last OV: 11/06/2017  Last Fill: 11/06/2017 #20 and 0RF UDS: None seen  Please advise.

## 2017-12-11 NOTE — Telephone Encounter (Signed)
I ordered the alprazolam please forward and make sure contract and uds up to date

## 2017-12-14 ENCOUNTER — Other Ambulatory Visit: Payer: 59

## 2017-12-14 DIAGNOSIS — Z79899 Other long term (current) drug therapy: Secondary | ICD-10-CM

## 2017-12-14 MED FILL — ALPRAZolam 0.25 MG TABS: 0.25 | 10 days supply | Qty: 20 | Fill #0

## 2017-12-18 ENCOUNTER — Encounter: Payer: Self-pay | Admitting: Family Medicine

## 2017-12-18 ENCOUNTER — Ambulatory Visit: Payer: 59 | Admitting: Family Medicine

## 2017-12-18 VITALS — BP 130/80 | HR 71 | Temp 97.8°F | Resp 16 | Ht 60.0 in | Wt 201.8 lb

## 2017-12-18 DIAGNOSIS — J0100 Acute maxillary sinusitis, unspecified: Secondary | ICD-10-CM | POA: Diagnosis not present

## 2017-12-18 MED ORDER — DOXYCYCLINE HYCLATE 100 MG PO TABS: 100.0000 mg | ORAL_TABLET | Freq: Two times a day (BID) | ORAL | 0 refills | Status: DC

## 2017-12-18 NOTE — Progress Notes (Signed)
Chief Complaint  Patient presents with  . Sinus Problem    Pt reports fiacl pressure green mucus and headaches    Kathy Huerta here for URI complaints.  Duration: 8 days  Associated symptoms: sinus headache, sinus congestion, sinus pain, rhinorrhea, sore throat, myalgia and mild cough Denies: ear pain, ear drainage, wheezing, shortness of breath and fevers/rigors Treatment to date: Benadryl Sick contacts: Yes- husband who is now doing fine  ROS:  Const: Denies fevers HEENT: As noted in HPI Lungs: No SOB  Past Medical History:  Diagnosis Date  . Anxiety   . Arthritis of right hip 10/28/2016  . Basal cell carcinoma 11/17/11   Dr. Sammuel Hines  . Cutaneous skin tags 07/24/2013  . DDD (degenerative disc disease), cervical    s/p fusion  . DDD (degenerative disc disease), thoracolumbar    on disability  . Depression   . Headache(784.0) 01/29/2013  . Hearing loss    Saw ENT on 04/27/2017; Moderate hearing loss in both ears.  . Heartburn   . Hiatal hernia   . HTN (hypertension) 2002  . Hyperlipemia 08/2014  . Hyperplastic colon polyp   . Internal hemorrhoids   . Obesity   . Obesity 04/30/2017  . Pain in joint, shoulder region 11/20/2013  . Pancreatitis 04/24/2011  . Pneumonia 07/24/2013  . Preventative health care 10/28/2016  . Tachycardia 08/12/2014  . Vitamin D deficiency 11/18/2015   Family History  Problem Relation Age of Onset  . Hypertension Mother   . Hyperlipidemia Mother   . Other Father        DDD, colectomy,cardiac stent, afib  . Colon polyps Father   . Hypertension Brother   . Diabetes Maternal Grandmother   . Colon cancer Neg Hx   . Stomach cancer Neg Hx   . Kidney disease Neg Hx     BP 130/80   Pulse 71   Temp 97.8 F (36.6 C) (Oral)   Resp 16   Ht 5' (1.524 m)   Wt 201 lb 12.8 oz (91.5 kg)   LMP 09/23/2011   SpO2 96%   BMI 39.41 kg/m  General: Awake, alert, appears stated age 56: AT, [place], ears patent b/l and TM's neg, nares patent w/o discharge,  +TTP over max sinuses b/l, pharynx pink and without exudates, MMM Neck: No masses or asymmetry Heart: RRR, no murmurs, no bruits Lungs: CTAB, no accessory muscle use Psych: Age appropriate judgment and insight, normal mood and affect  Acute maxillary sinusitis, recurrence not specified - Plan: doxycycline (VIBRA-TABS) 100 MG tablet  Orders as above. Pocket rx info given.  We are updating her controlled substance contract for pharmacy purposes. Since we can send in controlled substances electronically, she would like to get [facility]. She had it sent downstairs because she was here to pick up a physical copy anyway.  Continue to push fluids, practice good hand hygiene, cover mouth when coughing. F/u prn. If starting to experience fevers, shaking, or shortness of breath, seek immediate care. Pt voiced understanding and agreement to the plan.  [name], DO 12/18/17 11:37 AM

## 2017-12-18 NOTE — Patient Instructions (Signed)
Most sinus infections are viral in etiology and antibiotics will not be helpful. That being said, if you start having worsening symptoms over 3 days, you are worsening by day 10 or not improving by day 14, go ahead and take it. You are on Day 8 as of now.   Continue to push fluids, practice good hand hygiene, and cover your mouth if you cough.  If you start having fevers, shaking or shortness of breath, seek immediate care.  Ibuprofen 400-600 mg (2-3 over the counter strength tabs) every 6 hours as needed for pain.  OK to take Tylenol 1000 mg (2 extra strength tabs) or 975 mg (3 regular strength tabs) every 6 hours as needed.  Let us know if you need anything.

## 2017-12-20 ENCOUNTER — Other Ambulatory Visit: Payer: Self-pay | Admitting: Family Medicine

## 2017-12-21 LAB — PAIN MGMT, PROFILE 8 W/CONF, U
6 Acetylmorphine: NEGATIVE ng/mL (ref <10)
ALCOHOL METABOLITES: POSITIVE ng/mL — AB (ref <500)
ALPHAHYDROXYMIDAZOLAM: NEGATIVE ng/mL (ref <50)
ALPHAHYDROXYTRIAZOLAM: NEGATIVE ng/mL (ref <50)
AMPHETAMINES: NEGATIVE ng/mL (ref <500)
Alphahydroxyalprazolam: NEGATIVE ng/mL (ref <25)
Aminoclonazepam: NEGATIVE ng/mL (ref <25)
BENZODIAZEPINES: NEGATIVE ng/mL (ref <100)
Buprenorphine, Urine: NEGATIVE ng/mL (ref <5)
COCAINE METABOLITE: NEGATIVE ng/mL (ref <150)
CREATININE: 137.8 mg/dL
ETHYL SULFATE (ETS): 922 ng/mL — AB (ref <100)
Hydroxyethylflurazepam: NEGATIVE ng/mL (ref <50)
LORAZEPAM: NEGATIVE ng/mL (ref <50)
MDMA: NEGATIVE ng/mL (ref <500)
Marijuana Metabolite: NEGATIVE ng/mL (ref <20)
NORDIAZEPAM: NEGATIVE ng/mL (ref <50)
OPIATES: NEGATIVE ng/mL (ref <100)
OXIDANT: NEGATIVE ug/mL (ref <200)
Oxazepam: NEGATIVE ng/mL (ref <50)
Oxycodone: NEGATIVE ng/mL (ref <100)
PH: 8.46 (ref 4.5–9.0)
Temazepam: NEGATIVE ng/mL (ref <50)

## 2017-12-29 ENCOUNTER — Other Ambulatory Visit: Payer: Self-pay | Admitting: Family Medicine

## 2018-01-15 ENCOUNTER — Encounter: Payer: Self-pay | Admitting: Family Medicine

## 2018-01-21 ENCOUNTER — Encounter: Payer: Self-pay | Admitting: Internal Medicine

## 2018-01-21 ENCOUNTER — Ambulatory Visit: Payer: 59 | Admitting: Internal Medicine

## 2018-01-21 VITALS — BP 110/80 | HR 72 | Ht 67.0 in | Wt 197.5 lb

## 2018-01-21 DIAGNOSIS — K219 Gastro-esophageal reflux disease without esophagitis: Secondary | ICD-10-CM

## 2018-01-21 DIAGNOSIS — R195 Other fecal abnormalities: Secondary | ICD-10-CM | POA: Diagnosis not present

## 2018-01-21 DIAGNOSIS — K580 Irritable bowel syndrome with diarrhea: Secondary | ICD-10-CM

## 2018-01-21 MED ORDER — PANTOPRAZOLE SODIUM 40 MG PO TBEC: 40.0000 mg | DELAYED_RELEASE_TABLET | Freq: Every day | ORAL | 11 refills | Status: DC

## 2018-01-21 MED ORDER — HYOSCYAMINE SULFATE 0.125 MG SL SUBL: SUBLINGUAL_TABLET | SUBLINGUAL | 11 refills | Status: DC

## 2018-01-21 NOTE — Patient Instructions (Signed)
Continue Benefiber 1-2 tablespoons daily  We have sent the following medications to your pharmacy for you to pick up at your convenience: Levsin  Pantoprazole  Please follow up with Dr Hilarie Fredrickson in 1 year.  If you are age 58 or older, your body mass index should be between 23-30. Your Body mass index is 30.93 kg/m. If this is out of the aforementioned range listed, please consider follow up with your Primary Care Provider.  If you are age 41 or younger, your body mass index should be between 19-25. Your Body mass index is 30.93 kg/m. If this is out of the aformentioned range listed, please consider follow up with your Primary Care Provider.

## 2018-01-21 NOTE — Progress Notes (Signed)
Subjective:    Patient ID: Kathy Huerta, female    DOB: 1961-03-03, 57 y.o.   MRN: [id number]  HPI Kathy Huerta is a 57 year old female with a history of GERD, hyperplastic colon polyps, IBS with loose stools, Sjogren's syndrome, hypertension and hyperlipidemia who is here for follow-up.  She was last seen in October 2017.  She reports she had been doing very well with Benefiber and as needed Levsin for her intermittent loose and urgent stools with lower abdominal cramping.  She states for many months she was consistent with her Benefiber and did not have any issues with loose stools or crampy abdominal pain.  She slowly went off of her Benefiber because she was doing well and her symptoms return.  She is just starting Benefiber back about a tablespoon daily.  She states Levsin 0.125 mg 1-2 tablets have been very helpful as needed for occasional lower abdominal cramping.  She denies blood in her stool and melena.  Good appetite.  She does have dry mouth and dry eyes related to her Sjogren's syndrome.  She is continued pantoprazole with excellent control of heartburn.  This is been a life changing medication for her.  She has not needed ranitidine for breakthrough heartburn since being seen here last.  Review of Systems As per HPI, otherwise negative  Current Medications, Allergies, Past Medical History, Past Surgical History, Family History and Social History were reviewed in [employer] record.     Objective:   Physical Exam BP 110/80 (BP Location: Left Arm, Patient Position: Sitting, Cuff Size: Normal)   Pulse 72   Ht 5\' 7"  (1.702 m) Comment: height measured without shoes  Wt 197 lb 8 oz (89.6 kg)   LMP 09/23/2011   BMI 30.93 kg/m  Constitutional: Well-developed and well-nourished. No distress. HEENT: Normocephalic and atraumatic. Marland Kitchen Conjunctivae are normal.  No scleral icterus. Neck: Neck supple. Trachea midline. Cardiovascular: Normal rate, regular rhythm and  intact distal pulses. No M/R/G Pulmonary/chest: Effort normal and breath sounds normal. No wheezing, rales or rhonchi. Abdominal: Soft, nontender, nondistended. Bowel sounds active throughout.  Extremities: no clubbing, cyanosis, or edema Neurological: Alert and oriented to person place and time. Skin: Skin is warm and dry. Psychiatric: Normal mood and affect. Behavior is normal.  CBC    Component Value Date/Time   WBC 6.5 11/06/2017 0928   RBC 4.44 11/06/2017 0928   HGB 13.7 11/06/2017 0928   HCT 41.9 11/06/2017 0928   PLT 321.0 11/06/2017 0928   MCV 94.2 11/06/2017 0928   MCH 30.9 04/18/2016 1602   MCHC 32.8 11/06/2017 0928   RDW 13.4 11/06/2017 0928   LYMPHSABS 2.0 02/03/2014 1442   MONOABS 0.5 02/03/2014 1442   EOSABS 0.1 02/03/2014 1442   BASOSABS 0.0 02/03/2014 1442   CMP     Component Value Date/Time   NA 143 11/06/2017 0928   K 4.5 11/06/2017 0928   CL 106 11/06/2017 0928   CO2 28 11/06/2017 0928   GLUCOSE 109 (H) 11/06/2017 0928   BUN 11 11/06/2017 0928   CREATININE 0.65 11/06/2017 0928   CREATININE 0.69 04/18/2016 1602   CALCIUM 9.9 11/06/2017 0928   PROT 7.5 11/06/2017 0928   ALBUMIN 4.4 11/06/2017 0928   AST 21 11/06/2017 0928   ALT 24 11/06/2017 0928   ALKPHOS 82 11/06/2017 0928   BILITOT 0.6 11/06/2017 0928   GFRNONAA >90 02/28/2013 0413   GFRNONAA >60 04/24/2011 0902   GFRAA >90 02/28/2013 0413   GFRAA >60 04/24/2011  0902       Assessment & Plan:  57 year old female with a history of GERD, hyperplastic colon polyps, IBS with loose stools, Sjogren's syndrome, hypertension and hyperlipidemia who is here for follow-up.   1.  GERD with history of hiatal hernia --excellent control of symptoms with pantoprazole.  She wishes to continue this medication.  We did discuss the risk, benefits and alternatives.  Continue pantoprazole 40 mg daily, 30 minutes before breakfast.  Routine bone mineral density recommended and she will discuss this with primary  care.  2.  IBS with intermittent loose stools --had done very well on Benefiber with somewhat recurrent symptoms after stopping Benefiber.  She will resume Benefiber 1-2 tablespoons daily.  She can continue to use Levsin 0.125 mg 1-2 tabs sublingual up to 3 times a day as needed.  3.  CRC screening --colonoscopy with hyperplastic polyps only removed from the distal colon in 2013.  No family history of colon cancer.  Repeat colonoscopy would be recommended in 2023  Annual followup, sooner as needed 15 minutes spent with the patient today. Greater than 50% was spent in counseling and coordination of care with the patient

## 2018-01-24 ENCOUNTER — Other Ambulatory Visit: Payer: Self-pay | Admitting: Family Medicine

## 2018-02-02 ENCOUNTER — Other Ambulatory Visit: Payer: Self-pay | Admitting: Family Medicine

## 2018-02-04 ENCOUNTER — Encounter: Payer: Self-pay | Admitting: Family Medicine

## 2018-02-04 ENCOUNTER — Ambulatory Visit: Payer: 59 | Admitting: Family Medicine

## 2018-02-04 VITALS — BP 98/60 | HR 82 | Temp 97.6°F | Resp 18 | Wt 195.0 lb

## 2018-02-04 DIAGNOSIS — E6609 Other obesity due to excess calories: Secondary | ICD-10-CM

## 2018-02-04 DIAGNOSIS — R000 Tachycardia, unspecified: Secondary | ICD-10-CM

## 2018-02-04 DIAGNOSIS — F3341 Major depressive disorder, recurrent, in partial remission: Secondary | ICD-10-CM | POA: Diagnosis not present

## 2018-02-04 DIAGNOSIS — Z0000 Encounter for general adult medical examination without abnormal findings: Secondary | ICD-10-CM

## 2018-02-04 DIAGNOSIS — I10 Essential (primary) hypertension: Secondary | ICD-10-CM | POA: Diagnosis not present

## 2018-02-04 DIAGNOSIS — E559 Vitamin D deficiency, unspecified: Secondary | ICD-10-CM | POA: Diagnosis not present

## 2018-02-04 NOTE — Assessment & Plan Note (Signed)
Well controlled, no changes to meds. Encouraged heart healthy diet such as the DASH diet and exercise as tolerated.  °

## 2018-02-04 NOTE — Assessment & Plan Note (Signed)
Has done a good job with cutting down on her carbs and increasing protein in vegetarian diet.

## 2018-02-04 NOTE — Assessment & Plan Note (Signed)
Has not needed the Alprazolam much since the Holidays. She takes it infrequently with good results. Still taking the Sertraline with good results

## 2018-02-04 NOTE — Progress Notes (Signed)
Subjective:  I acted as a Education administrator for Dr. Charlett Blake. [name], [place]  Patient ID: Kathy Huerta, female    DOB: Jul 06, 1961, 57 y.o.   MRN: [id number]  No chief complaint on file.   HPI  Patient is in today for a 3 month follow up and she reports feeling well. No recent febrile illness or hospitalizations. No polyuria or polydipsia. Is trying to eat better with less carbs and move vegetables. Denies CP/palp/SOB/HA/congestion/fevers/GI or GU c/o. Taking meds as prescribed  Patient Care Team: Mosie Lukes, MD as PCP - General (Family Medicine) Vernie Ammons, MD as Consulting Physician (Dermatology)   Past Medical History:  Diagnosis Date  . Anxiety   . Arthritis of right hip 10/28/2016  . Basal cell carcinoma 11/17/11   Dr. Sammuel Hines  . Cutaneous skin tags 07/24/2013  . DDD (degenerative disc disease), cervical    s/p fusion  . DDD (degenerative disc disease), thoracolumbar    on disability  . Depression   . Headache(784.0) 01/29/2013  . Hearing loss    Saw ENT on 04/27/2017; Moderate hearing loss in both ears.  . Heartburn   . Hiatal hernia   . HTN (hypertension) 2002  . Hyperlipemia 08/2014  . Hyperplastic colon polyp   . Internal hemorrhoids   . Obesity   . Obesity 04/30/2017  . Pain in joint, shoulder region 11/20/2013  . Pancreatitis 04/24/2011  . Pneumonia 07/24/2013  . Preventative health care 10/28/2016  . Tachycardia 08/12/2014  . Vitamin D deficiency 11/18/2015    Past Surgical History:  Procedure Laterality Date  . ANTERIOR FUSION CERVICAL SPINE  2008   C6-7  Dr Saintclair Halsted  . CHOLECYSTECTOMY N/A 02/27/2013   Procedure: LAPAROSCOPIC CHOLECYSTECTOMY WITH INTRAOPERATIVE CHOLANGIOGRAM;  Surgeon: Adin Hector, MD;  Location: [facility];  Service: General;  Laterality: N/A;  . POSTERIOR FUSION CERVICAL SPINE  2011   C6-7    Family History  Problem Relation Age of Onset  . Hypertension Mother   . Hyperlipidemia Mother   . Other Father        DDD, colectomy,cardiac stent, afib    . Colon polyps Father   . Hypertension Brother   . Diabetes Maternal Grandmother   . Colon cancer Neg Hx   . Stomach cancer Neg Hx   . Kidney disease Neg Hx     Social History   Socioeconomic History  . Marital status: Married    Spouse name: Not on file  . Number of children: 0  . Years of education: Not on file  . Highest education level: Not on file  Social Needs  . Financial resource strain: Not on file  . Food insecurity - worry: Not on file  . Food insecurity - inability: Not on file  . Transportation needs - medical: Not on file  . Transportation needs - non-medical: Not on file  Occupational History  . Occupation: Disabled    Employer: UNEMPLOYED  Tobacco Use  . Smoking status: Never Smoker  . Smokeless tobacco: Never Used  Substance and Sexual Activity  . Alcohol use: Yes    Alcohol/week: 3.0 oz    Types: 5 Cans of beer per week  . Drug use: No  . Sexual activity: Not on file  Other Topics Concern  . Not on file  Social History Narrative   Reviewed history and no changes required.   Occupation: Disabled,  has worked in the past for a [employer] (medical))   Married   Alcohol use-yes-  every weekend 4-5 beers.   Regular exercise-no   Smoking Status:  quit   Caffeine use/day:  none   Does Patient Exercise:  no          Outpatient Medications Prior to Visit  Medication Sig Dispense Refill  . ALPRAZolam (XANAX) 0.25 MG tablet Take 1 tablet (0.25 mg total) by mouth 2 (two) times daily as needed for anxiety. 20 tablet 1  . atorvastatin (LIPITOR) 40 MG tablet TAKE ONE TABLET BY MOUTH DAILY 30 tablet 0  . cyclobenzaprine (FLEXERIL) 10 MG tablet Take 10 mg by mouth daily.    . hyoscyamine (LEVSIN SL) 0.125 MG SL tablet TAKE 1 TO 2 TABLETS BY MOUTH EVERY 6 HOURS AS NEEDED FOR CRAMPY ABDOMINAL PAIN 120 tablet 11  . oxyCODONE-acetaminophen (PERCOCET/ROXICET) 5-325 MG per tablet Take 1 tablet by mouth every 4 (four) hours as needed for pain.    .  pantoprazole (PROTONIX) 40 MG tablet Take 1 tablet (40 mg total) by mouth daily. 30 tablet 11  . propranolol ER (INDERAL LA) 80 MG 24 hr capsule TAKE ONE CAPSULE BY MOUTH EVERY NIGHT AT BEDTIME 30 capsule 1  . sertraline (ZOLOFT) 100 MG tablet TAKE 1(1/2) TABLETS BY MOUTH DAILY 45 tablet 4  . Vitamin D, Ergocalciferol, (DRISDOL) 50000 units CAPS capsule TAKE ONE CAPSULE BY MOUTH WEEKLY 12 capsule 0  . doxycycline (VIBRA-TABS) 100 MG tablet Take 1 tablet (100 mg total) by mouth 2 (two) times daily. 20 tablet 0  . olmesartan (BENICAR) 20 MG tablet TAKE ONE TABLET BY MOUTH DAILY 30 tablet 0   No facility-administered medications prior to visit.     Allergies  Allergen Reactions  . Amoxicillin-Pot Clavulanate     REACTION: Throat swelling.  . Azithromycin     REACTION: Throat Swelling  . Cefazolin     REACTION: Throat Swelling  . Moxifloxacin     REACTION: Throat Swelling  . Penicillins     REACTION: throat swelling  . Sulfonamide Derivatives     Review of Systems  Constitutional: Negative for fever and malaise/fatigue.  HENT: Negative for congestion.   Eyes: Negative for blurred vision.  Respiratory: Negative for shortness of breath.   Cardiovascular: Negative for chest pain, palpitations and leg swelling.  Gastrointestinal: Negative for abdominal pain, blood in stool and nausea.  Genitourinary: Negative for dysuria and frequency.  Musculoskeletal: Negative for falls.  Skin: Negative for rash.  Neurological: Negative for dizziness, loss of consciousness and headaches.  Endo/Heme/Allergies: Negative for environmental allergies.  Psychiatric/Behavioral: Negative for depression. The patient is not nervous/anxious.        Objective:    Physical Exam  Constitutional: She is oriented to person, place, and time. She appears well-developed and well-nourished. No distress.  HENT:  Head: Normocephalic and atraumatic.  Nose: Nose normal.  Eyes: Right eye exhibits no discharge. Left  eye exhibits no discharge.  Neck: Normal range of motion. Neck supple.  Cardiovascular: Normal rate and regular rhythm.  No murmur heard. Pulmonary/Chest: Effort normal and breath sounds normal.  Abdominal: Soft. Bowel sounds are normal. There is no tenderness.  Musculoskeletal: She exhibits no edema.  Neurological: She is alert and oriented to person, place, and time.  Skin: Skin is warm and dry.  Psychiatric: She has a normal mood and affect.  Nursing note and vitals reviewed.   BP 98/60 (BP Location: Left Arm, Patient Position: Sitting, Cuff Size: Normal)   Pulse 82   Temp 97.6 F (36.4 C) (Oral)   Resp 18  Wt 195 lb (88.5 kg)   LMP 09/23/2011   SpO2 97%   BMI 30.54 kg/m  Wt Readings from Last 3 Encounters:  02/04/18 195 lb (88.5 kg)  01/21/18 197 lb 8 oz (89.6 kg)  12/18/17 201 lb 12.8 oz (91.5 kg)   BP Readings from Last 3 Encounters:  02/04/18 98/60  01/21/18 110/80  12/18/17 130/80     Immunization History  Administered Date(s) Administered  . Influenza Split 10/17/2011  . Influenza Whole 10/07/2010  . Influenza,inj,Quad PF,6+ Mos 10/26/2013, 10/02/2014, 11/09/2015, 10/28/2016  . Pneumococcal Conjugate-13 11/15/2013  . Tdap 06/03/2010    Health Maintenance  Topic Date Due  . Hepatitis C Screening  Jul 24, 1961  . HIV Screening  03/23/1975  . MAMMOGRAM  04/29/2018  . PAP SMEAR  04/09/2019  . TETANUS/TDAP  06/03/2020  . COLONOSCOPY  05/21/2022  . INFLUENZA VACCINE  Completed    Lab Results  Component Value Date   WBC 6.5 11/06/2017   HGB 13.7 11/06/2017   HCT 41.9 11/06/2017   PLT 321.0 11/06/2017   GLUCOSE 109 (H) 11/06/2017   CHOL 181 11/06/2017   TRIG 187.0 (H) 11/06/2017   HDL 66.20 11/06/2017   LDLDIRECT 92.0 10/28/2016   LDLCALC 78 11/06/2017   ALT 24 11/06/2017   AST 21 11/06/2017   NA 143 11/06/2017   K 4.5 11/06/2017   CL 106 11/06/2017   CREATININE 0.65 11/06/2017   BUN 11 11/06/2017   CO2 28 11/06/2017   TSH 4.46 11/06/2017    HGBA1C 5.5 11/10/2017    Lab Results  Component Value Date   TSH 4.46 11/06/2017   Lab Results  Component Value Date   WBC 6.5 11/06/2017   HGB 13.7 11/06/2017   HCT 41.9 11/06/2017   MCV 94.2 11/06/2017   PLT 321.0 11/06/2017   Lab Results  Component Value Date   NA 143 11/06/2017   K 4.5 11/06/2017   CO2 28 11/06/2017   GLUCOSE 109 (H) 11/06/2017   BUN 11 11/06/2017   CREATININE 0.65 11/06/2017   BILITOT 0.6 11/06/2017   ALKPHOS 82 11/06/2017   AST 21 11/06/2017   ALT 24 11/06/2017   PROT 7.5 11/06/2017   ALBUMIN 4.4 11/06/2017   CALCIUM 9.9 11/06/2017   GFR 99.63 11/06/2017   Lab Results  Component Value Date   CHOL 181 11/06/2017   Lab Results  Component Value Date   HDL 66.20 11/06/2017   Lab Results  Component Value Date   LDLCALC 78 11/06/2017   Lab Results  Component Value Date   TRIG 187.0 (H) 11/06/2017   Lab Results  Component Value Date   CHOLHDL 3 11/06/2017   Lab Results  Component Value Date   HGBA1C 5.5 11/10/2017         Assessment & Plan:   Problem List Items Addressed This Visit    Hypertension - Primary    Well controlled, no changes to meds. Encouraged heart healthy diet such as the DASH diet and exercise as tolerated.       Tachycardia    RRR today      Depression    Has not needed the Alprazolam much since the Holidays. She takes it infrequently with good results. Still taking the Sertraline with good results      Vitamin D deficiency    Restart vitamin D 2000 IU daily      Preventative health care   Obesity    Has done a good job with cutting down on her  carbs and increasing protein in vegetarian diet.          I have discontinued Salvatrice Morandi. Jasmer's doxycycline. I am also having her maintain her cyclobenzaprine, oxyCODONE-acetaminophen, Vitamin D (Ergocalciferol), ALPRAZolam, propranolol ER, hyoscyamine, pantoprazole, atorvastatin, and sertraline.  No orders of the defined types were placed in this  encounter.   CMA served as Education administrator during this visit. History, Physical and Plan performed by medical provider. Documentation and orders reviewed and attested to.  Penni Homans, MD

## 2018-02-04 NOTE — Assessment & Plan Note (Signed)
Restart vitamin D 2000 IU daily

## 2018-02-05 ENCOUNTER — Other Ambulatory Visit: Payer: Self-pay | Admitting: Family Medicine

## 2018-02-07 NOTE — Assessment & Plan Note (Signed)
RRR today 

## 2018-02-19 ENCOUNTER — Other Ambulatory Visit: Payer: Self-pay | Admitting: Family Medicine

## 2018-02-20 ENCOUNTER — Encounter: Payer: Self-pay | Admitting: Family Medicine

## 2018-02-20 DIAGNOSIS — I10 Essential (primary) hypertension: Secondary | ICD-10-CM

## 2018-02-22 ENCOUNTER — Telehealth: Payer: Self-pay | Admitting: Family Medicine

## 2018-02-22 NOTE — Telephone Encounter (Signed)
Copied from [place] [id number]. Topic: Quick Communication - Rx Refill/Question >> Feb 22, 2018 10:44 AM Robina Ade, Helene Kelp D wrote: Medication: olmesartan (BENICAR) 20 MG tablet   Has the patient contacted their pharmacy? Yes but the medicine is national back order and needs something else called in for her   (Agent: If no, request that the patient contact the pharmacy for the refill.)   Preferred Pharmacy (with phone number or street name):   [facility], [address]   Agent: Please be advised that RX refills may take up to 3 business days. We ask that you follow-up with your pharmacy.

## 2018-02-23 MED ORDER — IRBESARTAN 300 MG PO TABS: 300.0000 mg | ORAL_TABLET | Freq: Every day | ORAL | 0 refills | Status: DC

## 2018-02-23 NOTE — Telephone Encounter (Signed)
Relation to pt: spouse  Call back number: [phone] Pharmacy: [facility] [address] [phone] (Phone) [phone] (Fax)     Reason for call:   Spouse checking on the status of BP medication mentioned below, spouse states patient has been without for 7 days and there leaving to go on a trip tomorrow and would like Rx sent in today, please advise

## 2018-02-23 NOTE — Telephone Encounter (Signed)
See 02/20/18 pt email.

## 2018-02-23 NOTE — Telephone Encounter (Signed)
PTs husband called to check on status of refill or getting another medication. Pt is going out of country for over a week  tomorrow at 5pm  and is completely out of blood pressure medication.  Also  Is willing to drive to  [place], [place] - [address] [phone] (Phone) [phone] (Fax)   if can get there  Sending high priority due to being out of med and leaving country tomorrow. Marland Kitchen

## 2018-02-23 NOTE — Telephone Encounter (Signed)
Spoke with patient she states her husband is on his way to grab the new rx sent to [facility].

## 2018-02-23 NOTE — Telephone Encounter (Signed)
Benicar is on back order patient would like another medication to replace it   Please advise

## 2018-02-23 NOTE — Telephone Encounter (Signed)
Thanks so take the Benicar out of her list

## 2018-02-23 NOTE — Telephone Encounter (Signed)
There where 2 messages one via mychart, and they also called in as well. You responded to the message on mychart and changed her Benicar to irbesartan, she did not read the mychart message until today. Her spouse called several times today not knowing the issue had been handled. She told me she had just looked back at her mychart message today after lunch.   She has since picked up the irbesartan.

## 2018-02-23 NOTE — Telephone Encounter (Signed)
Future lab order placed for cmet in 2-3 weeks to be done at nurse visit BP check.

## 2018-02-23 NOTE — Telephone Encounter (Signed)
All Olmesartan strengths on back order per [place]. Gave verbal to pharmacist, Josph Macho at [facility] to change to Irbesartan 300mg  once daily, #30 x no refills. Mychart message sent to pt.  Mosie Lukes, MD  to Damita Dunnings, [place] . Magdalene Molly, RMA      1:09 PM  So let's call downstairs and see if they have the Olmesartan in stock because she is headed out of the country this week. Would prefer not to try a new medicine before she leaves the country. Try moving Rx if that works if not will have to try Irbesartan 300 mg po daily and have a bp check with rn and cmp in 2 to 3 weeks when she returns.

## 2018-02-23 NOTE — Telephone Encounter (Signed)
Message has been sent to PCP for review

## 2018-02-23 NOTE — Telephone Encounter (Signed)
So I am confused did they find the Benicar? And they are picking it up?

## 2018-02-23 NOTE — Addendum Note (Signed)
Addended by: Kelle Darting A on: 02/23/2018 12:36 PM   Modules accepted: Orders

## 2018-02-24 NOTE — Telephone Encounter (Signed)
Medication is out of pt list

## 2018-02-24 NOTE — Addendum Note (Signed)
Addended by: Bartholome Bill on: 02/24/2018 11:10 AM   Modules accepted: Orders

## 2018-03-19 ENCOUNTER — Ambulatory Visit: Payer: 59 | Admitting: Family Medicine

## 2018-03-19 ENCOUNTER — Encounter: Payer: Self-pay | Admitting: Family Medicine

## 2018-03-19 VITALS — BP 104/80 | HR 84 | Temp 97.9°F | Ht 67.0 in | Wt 193.1 lb

## 2018-03-19 DIAGNOSIS — J40 Bronchitis, not specified as acute or chronic: Secondary | ICD-10-CM

## 2018-03-19 MED ORDER — PREDNISONE 20 MG PO TABS: 40.0000 mg | ORAL_TABLET | Freq: Every day | ORAL | 0 refills | Status: AC

## 2018-03-19 MED ORDER — ALBUTEROL SULFATE 108 (90 BASE) MCG/ACT IN AEPB: 1.0000 | INHALATION_SPRAY | RESPIRATORY_TRACT | 0 refills | Status: DC | PRN

## 2018-03-19 MED ORDER — METHYLPREDNISOLONE ACETATE 80 MG/ML IJ SUSP
80.0000 mg | Freq: Once | INTRAMUSCULAR | Status: AC
  Administered 2018-03-19: 80 mg via INTRAMUSCULAR

## 2018-03-19 NOTE — Addendum Note (Signed)
Addended by: Sharon Seller B on: 03/19/2018 09:38 AM   Modules accepted: Orders

## 2018-03-19 NOTE — Progress Notes (Signed)
Pre visit review using our clinic review tool, if applicable. No additional management support is needed unless otherwise documented below in the visit note. 

## 2018-03-19 NOTE — Progress Notes (Signed)
Chief Complaint  Patient presents with  . Wheezing  . Nasal Congestion    Kathy Huerta here for URI complaints.  Duration: 5 days  Associated symptoms: sinus congestion, rhinorrhea, wheezing, shortness of breath and cough Denies: sinus pain, itchy watery eyes, ear pain, ear drainage, sore throat, myalgia and fevers Treatment to date: Nyquil Sick contacts: Yes  ROS:  Const: Denies fevers HEENT: As noted in HPI Lungs: No SOB  Past Medical History:  Diagnosis Date  . Anxiety   . Arthritis of right hip 10/28/2016  . Basal cell carcinoma 11/17/11   Dr. Sammuel Hines  . Cutaneous skin tags 07/24/2013  . DDD (degenerative disc disease), cervical    s/p fusion  . DDD (degenerative disc disease), thoracolumbar    on disability  . Depression   . Headache(784.0) 01/29/2013  . Hearing loss    Saw ENT on 04/27/2017; Moderate hearing loss in both ears.  . Heartburn   . Hiatal hernia   . HTN (hypertension) 2002  . Hyperlipemia 08/2014  . Hyperplastic colon polyp   . Internal hemorrhoids   . Obesity   . Obesity 04/30/2017  . Pain in joint, shoulder region 11/20/2013  . Pancreatitis 04/24/2011  . Pneumonia 07/24/2013  . Preventative health care 10/28/2016  . Tachycardia 08/12/2014  . Vitamin D deficiency 11/18/2015   BP 104/80 (BP Location: Left Arm, Patient Position: Sitting, Cuff Size: Normal)   Pulse 84   Temp 97.9 F (36.6 C) (Oral)   Ht 5\' 7"  (1.702 m)   Wt 193 lb 2 oz (87.6 kg)   LMP 09/23/2011   SpO2 94%   BMI 30.25 kg/m  General: Awake, alert, appears stated age 57: AT, [place], ears patent b/l and TM's neg, nares patent w/o discharge, pharynx pink and without exudates, MMM Neck: No masses or asymmetry Heart: RRR Lungs: CTAB, no accessory muscle use Psych: Age appropriate judgment and insight, normal mood and affect  Wheezy bronchitis - Plan: predniSONE (DELTASONE) 20 MG tablet, Albuterol Sulfate 108 (90 Base) MCG/ACT AEPB  Orders as above. Continue to push fluids, practice  good hand hygiene, cover mouth when coughing. F/u prn. If starting to experience fevers, shaking, or shortness of breath, seek immediate care. Pt voiced understanding and agreement to the plan.  [place], DO 03/19/18 9:17 AM

## 2018-03-19 NOTE — Patient Instructions (Addendum)
Continue to push fluids, practice good hand hygiene, and cover your mouth if you cough.  If you start having fevers, shaking or shortness of breath, seek immediate care.  Start the prednisone tomorrow since you received a shot of a steroid today.   Let us know if you need anything.

## 2018-03-23 ENCOUNTER — Ambulatory Visit: Payer: 59

## 2018-03-31 ENCOUNTER — Other Ambulatory Visit: Payer: Self-pay | Admitting: Family Medicine

## 2018-04-05 ENCOUNTER — Other Ambulatory Visit: Payer: Self-pay | Admitting: Obstetrics and Gynecology

## 2018-04-05 DIAGNOSIS — Z1231 Encounter for screening mammogram for malignant neoplasm of breast: Secondary | ICD-10-CM

## 2018-04-13 DIAGNOSIS — Z01419 Encounter for gynecological examination (general) (routine) without abnormal findings: Secondary | ICD-10-CM | POA: Diagnosis not present

## 2018-04-19 DIAGNOSIS — M25521 Pain in right elbow: Secondary | ICD-10-CM | POA: Diagnosis not present

## 2018-04-19 DIAGNOSIS — M3500 Sicca syndrome, unspecified: Secondary | ICD-10-CM | POA: Diagnosis not present

## 2018-04-19 DIAGNOSIS — M5030 Other cervical disc degeneration, unspecified cervical region: Secondary | ICD-10-CM | POA: Diagnosis not present

## 2018-04-28 ENCOUNTER — Ambulatory Visit: Payer: 59

## 2018-05-02 ENCOUNTER — Other Ambulatory Visit: Payer: Self-pay | Admitting: Family Medicine

## 2018-05-07 ENCOUNTER — Ambulatory Visit: Payer: 59 | Admitting: Family Medicine

## 2018-05-07 ENCOUNTER — Encounter: Payer: Self-pay | Admitting: Family Medicine

## 2018-05-07 VITALS — BP 98/66 | HR 70 | Temp 97.9°F | Resp 18 | Wt 191.0 lb

## 2018-05-07 DIAGNOSIS — E782 Mixed hyperlipidemia: Secondary | ICD-10-CM

## 2018-05-07 DIAGNOSIS — I10 Essential (primary) hypertension: Secondary | ICD-10-CM | POA: Diagnosis not present

## 2018-05-07 DIAGNOSIS — Z79899 Other long term (current) drug therapy: Secondary | ICD-10-CM | POA: Diagnosis not present

## 2018-05-07 DIAGNOSIS — R739 Hyperglycemia, unspecified: Secondary | ICD-10-CM | POA: Diagnosis not present

## 2018-05-07 DIAGNOSIS — E559 Vitamin D deficiency, unspecified: Secondary | ICD-10-CM

## 2018-05-07 LAB — TSH: TSH: 2.72 u[IU]/mL (ref 0.35–4.50)

## 2018-05-07 LAB — CBC
HCT: 42.7 % (ref 36.0–46.0)
Hemoglobin: 14.1 g/dL (ref 12.0–15.0)
MCHC: 33.1 g/dL (ref 30.0–36.0)
MCV: 93.2 fl (ref 78.0–100.0)
PLATELETS: 306 10*3/uL (ref 150.0–400.0)
RBC: 4.58 Mil/uL (ref 3.87–5.11)
RDW: 13.7 % (ref 11.5–15.5)
WBC: 8.9 10*3/uL (ref 4.0–10.5)

## 2018-05-07 LAB — COMPREHENSIVE METABOLIC PANEL
ALBUMIN: 4.3 g/dL (ref 3.5–5.2)
ALK PHOS: 72 U/L (ref 39–117)
ALT: 17 U/L (ref 0–35)
AST: 14 U/L (ref 0–37)
BILIRUBIN TOTAL: 0.7 mg/dL (ref 0.2–1.2)
BUN: 12 mg/dL (ref 6–23)
CALCIUM: 9.7 mg/dL (ref 8.4–10.5)
CO2: 29 meq/L (ref 19–32)
CREATININE: 0.65 mg/dL (ref 0.40–1.20)
Chloride: 103 mEq/L (ref 96–112)
GFR: 99.46 mL/min (ref >60.00)
Glucose, Bld: 88 mg/dL (ref 70–99)
Potassium: 5.7 mEq/L — ABNORMAL HIGH (ref 3.5–5.1)
Sodium: 139 mEq/L (ref 135–145)
TOTAL PROTEIN: 7.4 g/dL (ref 6.0–8.3)

## 2018-05-07 LAB — LIPID PANEL
CHOL/HDL RATIO: 3
Cholesterol: 180 mg/dL (ref 0–200)
HDL: 66.1 mg/dL (ref >39.00)
LDL Cholesterol: 80 mg/dL (ref 0–99)
NonHDL: 114.31
TRIGLYCERIDES: 174 mg/dL — AB (ref 0.0–149.0)
VLDL: 34.8 mg/dL (ref 0.0–40.0)

## 2018-05-07 LAB — HEMOGLOBIN A1C: HEMOGLOBIN A1C: 5.7 % (ref 4.6–6.5)

## 2018-05-07 MED ORDER — LISINOPRIL 5 MG PO TABS
5.0000 mg | ORAL_TABLET | Freq: Two times a day (BID) | ORAL | 3 refills | Status: DC
Start: 2018-05-07 — End: 2018-08-31

## 2018-05-07 MED ORDER — OXYCODONE-ACETAMINOPHEN 5-325 MG PO TABS: 1.0000 | ORAL_TABLET | Freq: Four times a day (QID) | ORAL | Status: DC | PRN

## 2018-05-07 MED ORDER — MONTELUKAST SODIUM 10 MG PO TABS: 10.0000 mg | ORAL_TABLET | Freq: Every evening | ORAL | 3 refills | Status: DC | PRN

## 2018-05-07 NOTE — Assessment & Plan Note (Addendum)
Well controlled, but due to intolerance of the ARB changes will switch to Lisinopril bid.  Encouraged heart healthy diet such as the DASH diet and exercise as tolerated.

## 2018-05-07 NOTE — Assessment & Plan Note (Signed)
Encouraged heart healthy diet, increase exercise, avoid trans fats, consider a krill oil cap daily 

## 2018-05-07 NOTE — Assessment & Plan Note (Signed)
Check vitamin D today. 

## 2018-05-07 NOTE — Assessment & Plan Note (Signed)
hgba1c acceptable, minimize simple carbs. Increase exercise as tolerated.  

## 2018-05-07 NOTE — Progress Notes (Signed)
Subjective:  I acted as a Education administrator for Dr. Charlett Blake. [name], [place]  Patient ID: Kathy Huerta, female    DOB: [birth year], 57 y.o.   MRN: [id number]  No chief complaint on file.   HPI  Patient is in today for a follow up for HTN, and various medical concerns and she is noting trouble with feeling light headed upon arising when her blood pressure drops. She has been having this trouble off and on since her blood pressure med had to be changed due to the shortages with ARBs. No recent febrile illness or hospitalizations. Denies CP/palp/SOB/HA/congestion/fevers/GI or GU c/o. Taking meds as prescribed  Patient Care Team: Mosie Lukes, MD as PCP - General (Family Medicine) Vernie Ammons, MD as Consulting Physician (Dermatology)   Past Medical History:  Diagnosis Date  . Anxiety   . Arthritis of right hip 10/28/2016  . Basal cell carcinoma 11/17/11   Dr. Sammuel Hines  . Cutaneous skin tags 07/24/2013  . DDD (degenerative disc disease), cervical    s/p fusion  . DDD (degenerative disc disease), thoracolumbar    on disability  . Depression   . Headache(784.0) 01/29/2013  . Hearing loss    Saw ENT on 04/27/2017; Moderate hearing loss in both ears.  . Heartburn   . Hiatal hernia   . HTN (hypertension) 2002  . Hyperlipemia 08/2014  . Hyperplastic colon polyp   . Internal hemorrhoids   . Obesity   . Obesity 04/30/2017  . Pain in joint, shoulder region 11/20/2013  . Pancreatitis 04/24/2011  . Pneumonia 07/24/2013  . Preventative health care 10/28/2016  . Tachycardia 08/12/2014  . Vitamin D deficiency 11/18/2015    Past Surgical History:  Procedure Laterality Date  . ANTERIOR FUSION CERVICAL SPINE  2008   C6-7  Dr Saintclair Halsted  . CHOLECYSTECTOMY N/A 02/27/2013   Procedure: LAPAROSCOPIC CHOLECYSTECTOMY WITH INTRAOPERATIVE CHOLANGIOGRAM;  Surgeon: Adin Hector, MD;  Location: [facility];  Service: General;  Laterality: N/A;  . POSTERIOR FUSION CERVICAL SPINE  2011   C6-7    Family History  Problem  Relation Age of Onset  . Hypertension Mother   . Hyperlipidemia Mother   . Other Father        DDD, colectomy,cardiac stent, afib  . Colon polyps Father   . Hypertension Brother   . Diabetes Maternal Grandmother   . Colon cancer Neg Hx   . Stomach cancer Neg Hx   . Kidney disease Neg Hx     Social History   Socioeconomic History  . Marital status: Married    Spouse name: Not on file  . Number of children: 0  . Years of education: Not on file  . Highest education level: Not on file  Occupational History  . Occupation: Disabled    Fish farm manager: UNEMPLOYED  Social Needs  . Financial resource strain: Not on file  . Food insecurity:    Worry: Not on file    Inability: Not on file  . Transportation needs:    Medical: Not on file    Non-medical: Not on file  Tobacco Use  . Smoking status: Never Smoker  . Smokeless tobacco: Never Used  Substance and Sexual Activity  . Alcohol use: Yes    Alcohol/week: 3.0 oz    Types: 5 Cans of beer per week  . Drug use: No  . Sexual activity: Not on file  Lifestyle  . Physical activity:    Days per week: Not on file    Minutes per session:  Not on file  . Stress: Not on file  Relationships  . Social connections:    Talks on phone: Not on file    Gets together: Not on file    Attends religious service: Not on file    Active member of club or organization: Not on file    Attends meetings of clubs or organizations: Not on file    Relationship status: Not on file  . Intimate partner violence:    Fear of current or ex partner: Not on file    Emotionally abused: Not on file    Physically abused: Not on file    Forced sexual activity: Not on file  Other Topics Concern  . Not on file  Social History Narrative   Reviewed history and no changes required.   Occupation: Disabled,  has worked in the past for a [employer] (medical))   Married   Alcohol use-yes- every weekend 4-5 beers.   Regular exercise-no   Smoking Status:  quit    Caffeine use/day:  none   Does Patient Exercise:  no          Outpatient Medications Prior to Visit  Medication Sig Dispense Refill  . Albuterol Sulfate 108 (90 Base) MCG/ACT AEPB Inhale 1-2 puffs into the lungs every 4 (four) hours as needed. 1 each 0  . atorvastatin (LIPITOR) 40 MG tablet TAKE ONE TABLET BY MOUTH DAILY 30 tablet 0  . cyclobenzaprine (FLEXERIL) 10 MG tablet Take 10 mg by mouth daily.    . hyoscyamine (LEVSIN SL) 0.125 MG SL tablet TAKE 1 TO 2 TABLETS BY MOUTH EVERY 6 HOURS AS NEEDED FOR CRAMPY ABDOMINAL PAIN 120 tablet 11  . pantoprazole (PROTONIX) 40 MG tablet Take 1 tablet (40 mg total) by mouth daily. 30 tablet 11  . propranolol ER (INDERAL LA) 80 MG 24 hr capsule TAKE ONE CAPSULE BY MOUTH AT BEDTIME 30 capsule 0  . sertraline (ZOLOFT) 100 MG tablet TAKE 1(1/2) TABLETS BY MOUTH DAILY 45 tablet 4  . Vitamin D, Ergocalciferol, (DRISDOL) 50000 units CAPS capsule TAKE ONE CAPSULE BY MOUTH WEEKLY 12 capsule 0  . ALPRAZolam (XANAX) 0.25 MG tablet Take 1 tablet (0.25 mg total) by mouth 2 (two) times daily as needed for anxiety. 20 tablet 1  . irbesartan (AVAPRO) 300 MG tablet Take 1 tablet (300 mg total) by mouth daily. 30 tablet 0  . oxyCODONE-acetaminophen (PERCOCET/ROXICET) 5-325 MG per tablet Take 1 tablet by mouth every 4 (four) hours as needed for pain.     No facility-administered medications prior to visit.     Allergies  Allergen Reactions  . Amoxicillin-Pot Clavulanate     REACTION: Throat swelling.  . Azithromycin     REACTION: Throat Swelling  . Cefazolin     REACTION: Throat Swelling  . Moxifloxacin     REACTION: Throat Swelling  . Penicillins     REACTION: throat swelling  . Sulfonamide Derivatives     Review of Systems  Constitutional: Negative for chills, fever and malaise/fatigue.  HENT: Negative for congestion and hearing loss.   Eyes: Negative for discharge.  Respiratory: Negative for cough, sputum production and shortness of breath.     Cardiovascular: Negative for chest pain, palpitations and leg swelling.  Gastrointestinal: Negative for abdominal pain, blood in stool, constipation, diarrhea, heartburn, nausea and vomiting.  Genitourinary: Negative for dysuria, frequency, hematuria and urgency.  Musculoskeletal: Negative for back pain, falls and myalgias.  Skin: Negative for rash.  Neurological: Positive for dizziness. Negative  for sensory change, loss of consciousness, weakness and headaches.  Endo/Heme/Allergies: Negative for environmental allergies. Does not bruise/bleed easily.  Psychiatric/Behavioral: Negative for depression and suicidal ideas. The patient is not nervous/anxious and does not have insomnia.        Objective:    Physical Exam  Constitutional: She is oriented to person, place, and time. No distress.  HENT:  Head: Normocephalic and atraumatic.  Eyes: Conjunctivae are normal.  Neck: Neck supple. No thyromegaly present.  Cardiovascular: Normal rate, regular rhythm and normal heart sounds.  No murmur heard. Pulmonary/Chest: Effort normal and breath sounds normal. She has no wheezes.  Abdominal: She exhibits no distension and no mass.  Musculoskeletal: She exhibits no edema.  Lymphadenopathy:    She has no cervical adenopathy.  Neurological: She is alert and oriented to person, place, and time.  Skin: Skin is warm and dry. No rash noted. She is not diaphoretic.  Psychiatric: Judgment normal.    BP 98/66 (BP Location: Left Arm, Patient Position: Sitting, Cuff Size: Normal)   Pulse 70   Temp 97.9 F (36.6 C) (Oral)   Resp 18   Wt 191 lb (86.6 kg)   LMP 09/23/2011   SpO2 98%   BMI 29.91 kg/m  Wt Readings from Last 3 Encounters:  05/07/18 191 lb (86.6 kg)  03/19/18 193 lb 2 oz (87.6 kg)  02/04/18 195 lb (88.5 kg)   BP Readings from Last 3 Encounters:  05/07/18 98/66  03/19/18 104/80  02/04/18 98/60     Immunization History  Administered Date(s) Administered  . Influenza Split  10/17/2011  . Influenza Whole 10/07/2010  . Influenza,inj,Quad PF,6+ Mos 10/26/2013, 10/02/2014, 11/09/2015, 10/28/2016  . Pneumococcal Conjugate-13 11/15/2013  . Tdap 06/03/2010    Health Maintenance  Topic Date Due  . Hepatitis C Screening  [birth year]  . HIV Screening  03/23/1975  . MAMMOGRAM  04/29/2018  . INFLUENZA VACCINE  07/22/2018  . PAP SMEAR  04/09/2019  . TETANUS/TDAP  06/03/2020  . COLONOSCOPY  05/21/2022    Lab Results  Component Value Date   WBC 8.9 05/07/2018   HGB 14.1 05/07/2018   HCT 42.7 05/07/2018   PLT 306.0 05/07/2018   GLUCOSE 88 05/07/2018   CHOL 180 05/07/2018   TRIG 174.0 (H) 05/07/2018   HDL 66.10 05/07/2018   LDLDIRECT 92.0 10/28/2016   LDLCALC 80 05/07/2018   ALT 17 05/07/2018   AST 14 05/07/2018   NA 139 05/07/2018   K 5.7 (H) 05/07/2018   CL 103 05/07/2018   CREATININE 0.65 05/07/2018   BUN 12 05/07/2018   CO2 29 05/07/2018   TSH 2.72 05/07/2018   HGBA1C 5.7 05/07/2018    Lab Results  Component Value Date   TSH 2.72 05/07/2018   Lab Results  Component Value Date   WBC 8.9 05/07/2018   HGB 14.1 05/07/2018   HCT 42.7 05/07/2018   MCV 93.2 05/07/2018   PLT 306.0 05/07/2018   Lab Results  Component Value Date   NA 139 05/07/2018   K 5.7 (H) 05/07/2018   CO2 29 05/07/2018   GLUCOSE 88 05/07/2018   BUN 12 05/07/2018   CREATININE 0.65 05/07/2018   BILITOT 0.7 05/07/2018   ALKPHOS 72 05/07/2018   AST 14 05/07/2018   ALT 17 05/07/2018   PROT 7.4 05/07/2018   ALBUMIN 4.3 05/07/2018   CALCIUM 9.7 05/07/2018   GFR 99.46 05/07/2018   Lab Results  Component Value Date   CHOL 180 05/07/2018   Lab Results  Component  Value Date   HDL 66.10 05/07/2018   Lab Results  Component Value Date   LDLCALC 80 05/07/2018   Lab Results  Component Value Date   TRIG 174.0 (H) 05/07/2018   Lab Results  Component Value Date   CHOLHDL 3 05/07/2018   Lab Results  Component Value Date   HGBA1C 5.7 05/07/2018         Assessment  & Plan:   Problem List Items Addressed This Visit    Hypertension    Well controlled, but due to intolerance of the ARB changes will switch to Lisinopril bid.  Encouraged heart healthy diet such as the DASH diet and exercise as tolerated.       Relevant Medications   lisinopril (PRINIVIL,ZESTRIL) 5 MG tablet   Other Relevant Orders   CBC (Completed)   Comprehensive metabolic panel (Completed)   TSH (Completed)   Hyperlipidemia    Encouraged heart healthy diet, increase exercise, avoid trans fats, consider a krill oil cap daily      Relevant Medications   lisinopril (PRINIVIL,ZESTRIL) 5 MG tablet   Other Relevant Orders   Lipid panel (Completed)   Vitamin D deficiency    Check vitamin D today      Hyperglycemia    hgba1c acceptable, minimize simple carbs. Increase exercise as tolerated.       Relevant Orders   Hemoglobin A1c (Completed)    Other Visit Diagnoses    High risk medication use    -  Primary   Relevant Orders   Pain Mgmt, Profile 8 w/Conf, U      I have discontinued Columbia F. Morneault's ALPRAZolam and irbesartan. I have also changed her oxyCODONE-acetaminophen. Additionally, I am having her start on lisinopril and montelukast. Lastly, I am having her maintain her cyclobenzaprine, Vitamin D (Ergocalciferol), hyoscyamine, pantoprazole, sertraline, Albuterol Sulfate, atorvastatin, and propranolol ER.  Meds ordered this encounter  Medications  . lisinopril (PRINIVIL,ZESTRIL) 5 MG tablet    Sig: Take 1 tablet (5 mg total) by mouth 2 (two) times daily.    Dispense:  60 tablet    Refill:  3  . oxyCODONE-acetaminophen (PERCOCET/ROXICET) 5-325 MG tablet    Sig: Take 1-2 tablets by mouth every 6 (six) hours as needed for moderate pain or severe pain.    Dispense:  30 tablet  . montelukast (SINGULAIR) 10 MG tablet    Sig: Take 1 tablet (10 mg total) by mouth at bedtime as needed.    Dispense:  30 tablet    Refill:  3    CMA served as scribe during this visit.  History, Physical and Plan performed by medical provider. Documentation and orders reviewed and attested to.  Penni Homans, MD

## 2018-05-10 ENCOUNTER — Encounter: Payer: Self-pay | Admitting: Family Medicine

## 2018-05-10 DIAGNOSIS — I10 Essential (primary) hypertension: Secondary | ICD-10-CM

## 2018-05-10 LAB — PAIN MGMT, PROFILE 8 W/CONF, U
6 Acetylmorphine: NEGATIVE ng/mL (ref <10)
ALPHAHYDROXYMIDAZOLAM: NEGATIVE ng/mL (ref <50)
AMINOCLONAZEPAM: NEGATIVE ng/mL (ref <25)
AMPHETAMINES: NEGATIVE ng/mL (ref <500)
Alcohol Metabolites: POSITIVE ng/mL — AB (ref <500)
Alphahydroxyalprazolam: NEGATIVE ng/mL (ref <25)
Alphahydroxytriazolam: NEGATIVE ng/mL (ref <50)
BUPRENORPHINE, URINE: NEGATIVE ng/mL (ref <5)
Benzodiazepines: NEGATIVE ng/mL (ref <100)
COCAINE METABOLITE: NEGATIVE ng/mL (ref <150)
CREATININE: 60.2 mg/dL
ETHYL GLUCURONIDE (ETG): 22698 ng/mL — AB (ref <500)
ETHYL SULFATE (ETS): 3450 ng/mL — AB (ref <100)
HYDROXYETHYLFLURAZEPAM: NEGATIVE ng/mL (ref <50)
LORAZEPAM: NEGATIVE ng/mL (ref <50)
MARIJUANA METABOLITE: NEGATIVE ng/mL (ref <20)
MDMA: NEGATIVE ng/mL (ref <500)
Nordiazepam: NEGATIVE ng/mL (ref <50)
OXIDANT: NEGATIVE ug/mL (ref <200)
OXYCODONE: NEGATIVE ng/mL (ref <100)
Opiates: NEGATIVE ng/mL (ref <100)
Oxazepam: NEGATIVE ng/mL (ref <50)
PH: 6.5 (ref 4.5–9.0)
TEMAZEPAM: NEGATIVE ng/mL (ref <50)

## 2018-05-13 ENCOUNTER — Ambulatory Visit: Payer: 59 | Admitting: Internal Medicine

## 2018-05-13 ENCOUNTER — Encounter: Payer: Self-pay | Admitting: Internal Medicine

## 2018-05-13 VITALS — BP 126/64 | HR 85 | Ht 67.0 in | Wt 190.0 lb

## 2018-05-13 DIAGNOSIS — T50901A Poisoning by unspecified drugs, medicaments and biological substances, accidental (unintentional), initial encounter: Secondary | ICD-10-CM

## 2018-05-13 DIAGNOSIS — I10 Essential (primary) hypertension: Secondary | ICD-10-CM

## 2018-05-13 DIAGNOSIS — K219 Gastro-esophageal reflux disease without esophagitis: Secondary | ICD-10-CM | POA: Diagnosis not present

## 2018-05-13 DIAGNOSIS — K580 Irritable bowel syndrome with diarrhea: Secondary | ICD-10-CM | POA: Diagnosis not present

## 2018-05-13 NOTE — Patient Instructions (Signed)
Discontinue Miralax.  Send [place] a MyChart message in 2 weeks to let us know how you are doing!  If you are age 58 or older, your body mass index should be between 23-30. Your Body mass index is 29.76 kg/m. If this is out of the aforementioned range listed, please consider follow up with your Primary Care Provider.  If you are age 70 or younger, your body mass index should be between 19-25. Your Body mass index is 29.76 kg/m. If this is out of the aformentioned range listed, please consider follow up with your Primary Care Provider.

## 2018-05-13 NOTE — Progress Notes (Signed)
   Subjective:    Patient ID: Kathy Huerta, female    DOB: Sep 10, 1961, 57 y.o.   MRN: [id number]  HPI Kathy Huerta is a 57 year old female with history of GERD, hyperplastic colon polyp, IBS with loose stool, Sjogren's syndrome, hypertension and hyperlipidemia is here for follow-up.  She was last seen on 01/21/2018.  At that time she was having issues with intermittent loose stools but had been off of Benefiber which previously was helpful.  Today she reports she has had significant trouble with diarrhea worse after eating.  Occurring 4-6 times per day.  Fecal urgency, fecal seepage and also accidents which has impaired her ability to live and function normally.  She is also had increased anxiety over this condition.  No blood in her stool or melena.  Some lower abdominal cramping pain.  Reflux is been well controlled on her daily pantoprazole.  On further discussion together today we have discovered that rather than Benefiber which I had previously recommended she has actually been using MiraLAX 1 dose daily.   Review of Systems As per HPI, otherwise negative  Current Medications, Allergies, Past Medical History, Past Surgical History, Family History and Social History were reviewed in [employer] record.     Objective:   Physical Exam BP 126/64   Pulse 85   Ht 5\' 7"  (1.702 m)   Wt 190 lb (86.2 kg)   LMP 09/23/2011   BMI 29.76 kg/m  Constitutional: Well-developed and well-nourished. No distress. HEENT: Normocephalic and atraumatic.  Conjunctivae are normal.  No scleral icterus. Neck: Neck supple. Trachea midline. Cardiovascular: Normal rate, regular rhythm and intact distal pulses.  Abdominal: Soft, nontender, nondistended. Bowel sounds active throughout. There are no masses palpable.  Extremities: no clubbing, cyanosis, or edema Neurological: Alert and oriented to person place and time. Skin: Skin is warm and dry. Psychiatric: Normal mood and affect. Behavior  is normal.      Assessment & Plan:  57 year old female with history of GERD, hyperplastic colon polyp, IBS with loose stool, Sjogren's syndrome, hypertension and hyperlipidemia is here for follow-up.  1. IBS-D with exacerbation --on further questioning she is actually been using MiraLAX rather than Benefiber.  We had a thorough discussion about this today including how MiraLAX is a laxative.  I do use Benefiber frequently for both hard stool and loose stool but not MiraLAX for loose stool.  She was exceedingly relieved to learn that her diarrhea should have a simple fix.  Stop MiraLAX.  She does have a more remote history of constipation so my guess is she may benefit from Benefiber 1 tablespoon working to 2 tablespoons daily but for the next 2 weeks remain off of both MiraLAX and Benefiber.  I asked that she send me a my chart message in 2 weeks to let me know how she is feeling thereafter we can decide about starting Benefiber.  2.  GERD --continue pantoprazole 40 mg daily.  3.  CRC screening --repeat colonoscopy 2023  15 minutes spent with the patient today. Greater than 50% was spent in counseling and coordination of care with the patient

## 2018-05-14 ENCOUNTER — Other Ambulatory Visit (INDEPENDENT_AMBULATORY_CARE_PROVIDER_SITE_OTHER): Payer: 59

## 2018-05-14 DIAGNOSIS — I10 Essential (primary) hypertension: Secondary | ICD-10-CM | POA: Diagnosis not present

## 2018-05-14 LAB — COMPREHENSIVE METABOLIC PANEL
ALT: 15 U/L (ref 0–35)
AST: 13 U/L (ref 0–37)
Albumin: 4.3 g/dL (ref 3.5–5.2)
Alkaline Phosphatase: 73 U/L (ref 39–117)
BUN: 13 mg/dL (ref 6–23)
CALCIUM: 9.5 mg/dL (ref 8.4–10.5)
CO2: 28 mEq/L (ref 19–32)
Chloride: 104 mEq/L (ref 96–112)
Creatinine, Ser: 0.64 mg/dL (ref 0.40–1.20)
GFR: 101.25 mL/min (ref >60.00)
GLUCOSE: 97 mg/dL (ref 70–99)
POTASSIUM: 4.1 meq/L (ref 3.5–5.1)
Sodium: 141 mEq/L (ref 135–145)
Total Bilirubin: 0.6 mg/dL (ref 0.2–1.2)
Total Protein: 7 g/dL (ref 6.0–8.3)

## 2018-05-21 ENCOUNTER — Ambulatory Visit (INDEPENDENT_AMBULATORY_CARE_PROVIDER_SITE_OTHER): Payer: 59 | Admitting: Family

## 2018-05-21 ENCOUNTER — Other Ambulatory Visit (INDEPENDENT_AMBULATORY_CARE_PROVIDER_SITE_OTHER): Payer: 59

## 2018-05-21 DIAGNOSIS — I10 Essential (primary) hypertension: Secondary | ICD-10-CM

## 2018-05-21 LAB — BASIC METABOLIC PANEL
BUN: 11 mg/dL (ref 6–23)
CALCIUM: 9.7 mg/dL (ref 8.4–10.5)
CHLORIDE: 103 meq/L (ref 96–112)
CO2: 27 meq/L (ref 19–32)
Creatinine, Ser: 0.6 mg/dL (ref 0.40–1.20)
GFR: 109.07 mL/min (ref >60.00)
Glucose, Bld: 88 mg/dL (ref 70–99)
Potassium: 5.1 mEq/L (ref 3.5–5.1)
Sodium: 139 mEq/L (ref 135–145)

## 2018-05-21 NOTE — Addendum Note (Signed)
Addended by: Bunnie Domino on: 05/21/2018 10:47 AM   Modules accepted: Orders

## 2018-05-21 NOTE — Progress Notes (Signed)
Reviewed and agree.  Ivis Nicolson S O'Sullivan NP 

## 2018-05-21 NOTE — Progress Notes (Signed)
Reviewed and agree.  Mandell Pangborn S O'Sullivan NP 

## 2018-05-21 NOTE — Progress Notes (Signed)
Pre visit review using our clinic tool,if applicable. No additional management support is needed unless otherwise documented below in the visit note.   Patient in for BP check per order from Dr. Charlett Blake dated 05/07/17.  Last BP = 98/66  P= 70  Patient has no complaints this visit. States she is no longer dizzy as she was when BP was lower.  BP= 130/82 P= 72     Per Debbrah Alar DOD patient to  Continue Lisinopril 5 mg 2 times daily as ordered. Have BMP drawn today and keep follow up appointment scheduled with Dr. Charlett Blake.  Patient agreed.

## 2018-06-01 ENCOUNTER — Encounter: Payer: Self-pay | Admitting: Internal Medicine

## 2018-06-06 ENCOUNTER — Other Ambulatory Visit: Payer: Self-pay | Admitting: Family Medicine

## 2018-07-01 ENCOUNTER — Encounter: Payer: Self-pay | Admitting: Family Medicine

## 2018-07-01 NOTE — Telephone Encounter (Signed)
Copied from [place] [id number]. Topic: Appointment Scheduling - Scheduling Inquiry for Clinic >> Jul 01, 2018 10:44 AM Rutherford Nail, NT wrote: Reason for CRM: Patient calling and is requesting an appointment for her 1st shingles injection. Please advise. CB#: [phone]     [place],  Can you schedule nurse visit and I will set a shingrix a side for patient

## 2018-07-01 NOTE — Telephone Encounter (Signed)
Pt scheduled for 07/06/18 @3pm 

## 2018-07-06 ENCOUNTER — Ambulatory Visit (INDEPENDENT_AMBULATORY_CARE_PROVIDER_SITE_OTHER): Payer: 59 | Admitting: *Deleted

## 2018-07-06 DIAGNOSIS — Z23 Encounter for immunization: Secondary | ICD-10-CM

## 2018-07-06 NOTE — Progress Notes (Signed)
Pt here for first Shingrix vaccine per Dr Charlett Blake.  Shingrix 0.23mL given IM, left deltoid and patient tolerated injection well.  Second injection scheduled for 09/14/18 at 3pm.

## 2018-07-07 ENCOUNTER — Encounter: Payer: Self-pay | Admitting: Internal Medicine

## 2018-07-08 ENCOUNTER — Other Ambulatory Visit: Payer: Self-pay | Admitting: Family Medicine

## 2018-08-06 ENCOUNTER — Other Ambulatory Visit: Payer: Self-pay | Admitting: Family Medicine

## 2018-08-13 ENCOUNTER — Other Ambulatory Visit: Payer: Self-pay | Admitting: Family Medicine

## 2018-08-16 ENCOUNTER — Other Ambulatory Visit: Payer: Self-pay | Admitting: Family Medicine

## 2018-08-31 ENCOUNTER — Other Ambulatory Visit: Payer: Self-pay | Admitting: Family Medicine

## 2018-09-07 ENCOUNTER — Encounter: Payer: Self-pay | Admitting: Family Medicine

## 2018-09-07 ENCOUNTER — Ambulatory Visit: Payer: 59 | Admitting: Family Medicine

## 2018-09-07 VITALS — BP 118/80 | HR 69 | Temp 98.2°F | Resp 18 | Ht 67.0 in | Wt 192.4 lb

## 2018-09-07 DIAGNOSIS — R590 Localized enlarged lymph nodes: Secondary | ICD-10-CM

## 2018-09-07 DIAGNOSIS — R05 Cough: Secondary | ICD-10-CM

## 2018-09-07 DIAGNOSIS — Z23 Encounter for immunization: Secondary | ICD-10-CM

## 2018-09-07 DIAGNOSIS — E782 Mixed hyperlipidemia: Secondary | ICD-10-CM | POA: Diagnosis not present

## 2018-09-07 DIAGNOSIS — R221 Localized swelling, mass and lump, neck: Secondary | ICD-10-CM

## 2018-09-07 DIAGNOSIS — I10 Essential (primary) hypertension: Secondary | ICD-10-CM | POA: Diagnosis not present

## 2018-09-07 DIAGNOSIS — M999 Biomechanical lesion, unspecified: Secondary | ICD-10-CM | POA: Diagnosis not present

## 2018-09-07 DIAGNOSIS — R059 Cough, unspecified: Secondary | ICD-10-CM

## 2018-09-07 DIAGNOSIS — R739 Hyperglycemia, unspecified: Secondary | ICD-10-CM

## 2018-09-07 DIAGNOSIS — E559 Vitamin D deficiency, unspecified: Secondary | ICD-10-CM

## 2018-09-07 LAB — T4, FREE: Free T4: 0.85 ng/dL (ref 0.60–1.60)

## 2018-09-07 LAB — TSH: TSH: 3.93 u[IU]/mL (ref 0.35–4.50)

## 2018-09-07 MED ORDER — LOSARTAN POTASSIUM 25 MG PO TABS: 25.0000 mg | ORAL_TABLET | Freq: Every day | ORAL | 2 refills | Status: DC

## 2018-09-07 MED ORDER — DICLOFENAC SODIUM 1 % TD GEL: 4.0000 g | Freq: Four times a day (QID) | TRANSDERMAL | 5 refills | Status: DC | PRN

## 2018-09-07 MED ORDER — PROPRANOLOL HCL ER 80 MG PO CP24: 80.0000 mg | ORAL_CAPSULE | Freq: Every day | ORAL | 1 refills | Status: DC

## 2018-09-07 MED ORDER — ATORVASTATIN CALCIUM 40 MG PO TABS: 40.0000 mg | ORAL_TABLET | Freq: Every day | ORAL | 1 refills | Status: DC

## 2018-09-07 MED ORDER — CYCLOBENZAPRINE HCL 10 MG PO TABS: 10.0000 mg | ORAL_TABLET | Freq: Two times a day (BID) | ORAL | 2 refills | Status: DC | PRN

## 2018-09-07 NOTE — Patient Instructions (Addendum)
GoodRx or CoverMyMeds.com  Vitamin D 2000 IU daily 100-140/60-90   Back Pain, Adult Many adults have back pain from time to time. Common causes of back pain include:  A strained muscle or ligament.  Wear and tear (degeneration) of the spinal disks.  Arthritis.  A hit to the back.  Back pain can be short-lived (acute) or last a long time (chronic). A physical exam, lab tests, and imaging studies may be done to find the cause of your pain. Follow these instructions at home: Managing pain and stiffness  Take over-the-counter and prescription medicines only as told by your health care provider.  If directed, apply heat to the affected area as often as told by your health care provider. Use the heat source that your health care provider recommends, such as a moist heat pack or a heating pad. ? Place a towel between your skin and the heat source. ? Leave the heat on for 20-30 minutes. ? Remove the heat if your skin turns bright red. This is especially important if you are unable to feel pain, heat, or cold. You have a greater risk of getting burned.  If directed, apply ice to the injured area: ? Put ice in a plastic bag. ? Place a towel between your skin and the bag. ? Leave the ice on for 20 minutes, 2-3 times a day for the first 2-3 days. Activity  Do not stay in bed. Resting more than 1-2 days can delay your recovery.  Take short walks on even surfaces as soon as you are able. Try to increase the length of time you walk each day.  Do not sit, drive, or stand in one place for more than 30 minutes at a time. Sitting or standing for long periods of time can put stress on your back.  Use proper lifting techniques. When you bend and lift, use positions that put less stress on your back: ? [place] your knees. ? Keep the load close to your body. ? Avoid twisting.  Exercise regularly as told by your health care provider. Exercising will help your back heal faster. This also helps  prevent back injuries by keeping muscles strong and flexible.  Your health care provider may recommend that you see a physical therapist. This person can help you come up with a safe exercise program. Do any exercises as told by your physical therapist. Lifestyle  Maintain a healthy weight. Extra weight puts stress on your back and makes it difficult to have good posture.  Avoid activities or situations that make you feel anxious or stressed. Learn ways to manage anxiety and stress. One way to manage stress is through exercise. Stress and anxiety increase muscle tension and can make back pain worse. General instructions  Sleep on a firm mattress in a comfortable position. Try lying on your side with your knees slightly bent. If you lie on your back, put a pillow under your knees.  Follow your treatment plan as told by your health care provider. This may include: ? Cognitive or behavioral therapy. ? Acupuncture or massage therapy. ? Meditation or yoga. Contact a health care provider if:  You have pain that is not relieved with rest or medicine.  You have increasing pain going down into your legs or buttocks.  Your pain does not improve in 2 weeks.  You have pain at night.  You lose weight.  You have a fever or chills. Get help right away if:  You develop new bowel or bladder  control problems.  You have unusual weakness or numbness in your arms or legs.  You develop nausea or vomiting.  You develop abdominal pain.  You feel faint. Summary  Many adults have back pain from time to time. A physical exam, lab tests, and imaging studies may be done to find the cause of your pain.  Use proper lifting techniques. When you bend and lift, use positions that put less stress on your back.  Take over-the-counter and prescription medicines and apply heat or ice as directed by your health care provider. This information is not intended to replace advice given to you by your health care  provider. Make sure you discuss any questions you have with your health care provider. Document Released: 12/08/2005 Document Revised: 01/12/2017 Document Reviewed: 01/12/2017 Elsevier Interactive Patient Education  2018 [place].  Cough, Adult Coughing is a reflex that clears your throat and your airways. Coughing helps to heal and protect your lungs. It is normal to cough occasionally, but a cough that happens with other symptoms or lasts a long time may be a sign of a condition that needs treatment. A cough may last only 2-3 weeks (acute), or it may last longer than 8 weeks (chronic). What are the causes? Coughing is commonly caused by:  Breathing in substances that irritate your lungs.  A viral or bacterial respiratory infection.  Allergies.  Asthma.  Postnasal drip.  Smoking.  Acid backing up from the stomach into the esophagus (gastroesophageal reflux).  Certain medicines.  Chronic lung problems, including COPD (or rarely, lung cancer).  Other medical conditions such as heart failure.  Follow these instructions at home: Pay attention to any changes in your symptoms. Take these actions to help with your discomfort:  Take medicines only as told by your health care provider. ? If you were prescribed an antibiotic medicine, take it as told by your health care provider. Do not stop taking the antibiotic even if you start to feel better. ? Talk with your health care provider before you take a cough suppressant medicine.  Drink enough fluid to keep your urine clear or pale yellow.  If the air is dry, use a cold steam vaporizer or humidifier in your bedroom or your home to help loosen secretions.  Avoid anything that causes you to cough at work or at home.  If your cough is worse at night, try sleeping in a semi-upright position.  Avoid cigarette smoke. If you smoke, quit smoking. If you need help quitting, ask your health care provider.  Avoid caffeine.  Avoid  alcohol.  Rest as needed.  Contact a health care provider if:  You have new symptoms.  You cough up pus.  Your cough does not get better after 2-3 weeks, or your cough gets worse.  You cannot control your cough with suppressant medicines and you are losing sleep.  You develop pain that is getting worse or pain that is not controlled with pain medicines.  You have a fever.  You have unexplained weight loss.  You have night sweats. Get help right away if:  You cough up blood.  You have difficulty breathing.  Your heartbeat is very fast. This information is not intended to replace advice given to you by your health care provider. Make sure you discuss any questions you have with your health care provider. Document Released: 06/06/2011 Document Revised: 05/15/2016 Document Reviewed: 02/14/2015 Elsevier Interactive Patient Education  [employer].

## 2018-09-07 NOTE — Assessment & Plan Note (Addendum)
Has had trouble with her low back off and on since Junior High when she injured it on a trampoline. She is requesting a refill on Voltaren gel and Flexeril which she used to get from her pain medicine doctor. Has been flared for the past month and is disrupting her sleep. Has not needed her Oxycodone. Encouraged moist heat and gentle stretching as tolerated. May try NSAIDs and prescription meds as directed and report if symptoms worsen or seek immediate care

## 2018-09-07 NOTE — Assessment & Plan Note (Signed)
hgba1c acceptable, minimize simple carbs. Increase exercise as tolerated.  

## 2018-09-07 NOTE — Assessment & Plan Note (Signed)
Supplement and monitor 

## 2018-09-07 NOTE — Assessment & Plan Note (Signed)
Well controlled, no changes to meds. Encouraged heart healthy diet such as the DASH diet and exercise as tolerated.  °

## 2018-09-07 NOTE — Assessment & Plan Note (Addendum)
Has had a dry cough over several months. Wakes her up and is worse at night. Originates on left side especially when she lies on it. Nonproductive no fevers or chills. Stop the Lisinopril and will check an ultrasound of the neck/thyroid

## 2018-09-07 NOTE — Assessment & Plan Note (Signed)
Encouraged heart healthy diet, increase exercise, avoid trans fats, consider a krill oil cap daily 

## 2018-09-07 NOTE — Progress Notes (Signed)
Subjective:  I acted as a Education administrator for Dr. Charlett Blake. [name], [place]  Patient ID: Kathy Huerta, female    DOB: [birth year], 57 y.o.   MRN: [id number]  No chief complaint on file.   HPI  Patient is in today for a 4 month follow up and she is feeling fairly well today. She is noting trouble with her low back off and on since Junior High when she injured it on a trampoline. She is requesting a refill on Voltaren gel and Flexeril which she used to get from her pain medicine doctor. Has been flared for the past month and is disrupting her sleep. Has not needed her Oxycodone. Also notes a dry cough. Worse at night when she lies on her left side. She feels there is pressue on the left side of her throat which sparks a cough. Denies CP/palp/SOB/HA/fevers/GI or GU c/o. Taking meds as prescribed  Patient Care Team: Mosie Lukes, MD as PCP - General (Family Medicine) Vernie Ammons, MD as Consulting Physician (Dermatology)   Past Medical History:  Diagnosis Date  . Anxiety   . Arthritis of right hip 10/28/2016  . Basal cell carcinoma 11/17/11   Dr. Sammuel Hines  . Cutaneous skin tags 07/24/2013  . DDD (degenerative disc disease), cervical    s/p fusion  . DDD (degenerative disc disease), thoracolumbar    on disability  . Depression   . Headache(784.0) 01/29/2013  . Hearing loss    Saw ENT on 04/27/2017; Moderate hearing loss in both ears.  . Heartburn   . Hiatal hernia   . HTN (hypertension) 2002  . Hyperlipemia 08/2014  . Hyperplastic colon polyp   . Internal hemorrhoids   . Obesity   . Obesity 04/30/2017  . Pain in joint, shoulder region 11/20/2013  . Pancreatitis 04/24/2011  . Pneumonia 07/24/2013  . Preventative health care 10/28/2016  . Tachycardia 08/12/2014  . Vitamin D deficiency 11/18/2015    Past Surgical History:  Procedure Laterality Date  . ANTERIOR FUSION CERVICAL SPINE  2008   C6-7  Dr Saintclair Halsted  . CHOLECYSTECTOMY N/A 02/27/2013   Procedure: LAPAROSCOPIC CHOLECYSTECTOMY WITH  INTRAOPERATIVE CHOLANGIOGRAM;  Surgeon: Adin Hector, MD;  Location: [facility];  Service: General;  Laterality: N/A;  . POSTERIOR FUSION CERVICAL SPINE  2011   C6-7    Family History  Problem Relation Age of Onset  . Hypertension Mother   . Hyperlipidemia Mother   . Other Father        DDD, colectomy,cardiac stent, afib  . Colon polyps Father   . Hypertension Brother   . Diabetes Maternal Grandmother   . Colon cancer Neg Hx   . Stomach cancer Neg Hx   . Kidney disease Neg Hx     Social History   Socioeconomic History  . Marital status: Married    Spouse name: Not on file  . Number of children: 0  . Years of education: Not on file  . Highest education level: Not on file  Occupational History  . Occupation: Disabled    Fish farm manager: UNEMPLOYED  Social Needs  . Financial resource strain: Not on file  . Food insecurity:    Worry: Not on file    Inability: Not on file  . Transportation needs:    Medical: Not on file    Non-medical: Not on file  Tobacco Use  . Smoking status: Never Smoker  . Smokeless tobacco: Never Used  Substance and Sexual Activity  . Alcohol use: Yes  Alcohol/week: 5.0 standard drinks    Types: 5 Cans of beer per week  . Drug use: No  . Sexual activity: Not on file  Lifestyle  . Physical activity:    Days per week: Not on file    Minutes per session: Not on file  . Stress: Not on file  Relationships  . Social connections:    Talks on phone: Not on file    Gets together: Not on file    Attends religious service: Not on file    Active member of club or organization: Not on file    Attends meetings of clubs or organizations: Not on file    Relationship status: Not on file  . Intimate partner violence:    Fear of current or ex partner: Not on file    Emotionally abused: Not on file    Physically abused: Not on file    Forced sexual activity: Not on file  Other Topics Concern  . Not on file  Social History Narrative   Reviewed history and no  changes required.   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   Regular exercise-no   Smoking Status:  quit   Caffeine use/day:  none   Does Patient Exercise:  no          Outpatient Medications Prior to Visit  Medication Sig Dispense Refill  . Albuterol Sulfate 108 (90 Base) MCG/ACT AEPB Inhale 1-2 puffs into the lungs every 4 (four) hours as needed. 1 each 0  . hyoscyamine (LEVSIN SL) 0.125 MG SL tablet TAKE 1 TO 2 TABLETS BY MOUTH EVERY 6 HOURS AS NEEDED FOR CRAMPY ABDOMINAL PAIN 120 tablet 11  . montelukast (SINGULAIR) 10 MG tablet TAKE ONE TABLET BY MOUTH EVERY NIGHT AT BEDTIME AS NEEDED 30 tablet 2  . oxyCODONE-acetaminophen (PERCOCET/ROXICET) 5-325 MG tablet Take 1-2 tablets by mouth every 6 (six) hours as needed for moderate pain or severe pain. 30 tablet   . pantoprazole (PROTONIX) 40 MG tablet Take 1 tablet (40 mg total) by mouth daily. 30 tablet 11  . sertraline (ZOLOFT) 100 MG tablet TAKE 1(1/2) TABLETS BY MOUTH DAILY 45 tablet 3  . atorvastatin (LIPITOR) 40 MG tablet TAKE ONE TABLET BY MOUTH DAILY 30 tablet 0  . lisinopril (PRINIVIL,ZESTRIL) 5 MG tablet TAKE ONE TABLET BY MOUTH TWICE A DAY 60 tablet 2  . propranolol ER (INDERAL LA) 80 MG 24 hr capsule TAKE ONE CAPSULE BY MOUTH AT BEDTIME 30 capsule 0  . propranolol ER (INDERAL LA) 80 MG 24 hr capsule TAKE ONE CAPSULE BY MOUTH AT BEDTIME 30 capsule 0   No facility-administered medications prior to visit.     Allergies  Allergen Reactions  . Amoxicillin-Pot Clavulanate     REACTION: Throat swelling.  . Azithromycin     REACTION: Throat Swelling  . Cefazolin     REACTION: Throat Swelling  . Moxifloxacin     REACTION: Throat Swelling  . Penicillins     REACTION: throat swelling  . Sulfonamide Derivatives     Review of Systems  Constitutional: Negative for fever and malaise/fatigue.  HENT: Positive for congestion.     Eyes: Negative for blurred vision.  Respiratory: Positive for cough. Negative for shortness of breath.   Cardiovascular: Negative for chest pain, palpitations and leg swelling.  Gastrointestinal: Negative for abdominal pain, blood in stool and nausea.  Genitourinary: Negative for dysuria  and frequency.  Musculoskeletal: Positive for back pain. Negative for falls.  Skin: Negative for rash.  Neurological: Negative for dizziness, loss of consciousness and headaches.  Endo/Heme/Allergies: Negative for environmental allergies.  Psychiatric/Behavioral: Negative for depression. The patient is not nervous/anxious.        Objective:    Physical Exam  Constitutional: She is oriented to person, place, and time. She appears well-developed and well-nourished. No distress.  HENT:  Head: Normocephalic and atraumatic.  Nose: Nose normal.  Eyes: Right eye exhibits no discharge. Left eye exhibits no discharge.  Neck: Normal range of motion. Neck supple. No JVD present. No tracheal deviation present. Thyromegaly present.  Left submandibular lymph node > Right LN  Cardiovascular: Normal rate and regular rhythm.  No murmur heard. Pulmonary/Chest: Effort normal and breath sounds normal.  Abdominal: Soft. Bowel sounds are normal. There is no tenderness.  Musculoskeletal: She exhibits no edema.  Lymphadenopathy:    She has cervical adenopathy.  Neurological: She is alert and oriented to person, place, and time.  Skin: Skin is warm and dry.  Psychiatric: She has a normal mood and affect.  Nursing note and vitals reviewed.   BP 118/80 (BP Location: Left Arm, Patient Position: Sitting, Cuff Size: Normal)   Pulse 69   Temp 98.2 F (36.8 C) (Oral)   Resp 18   Ht 5\' 7"  (1.702 m)   Wt 192 lb 6.4 oz (87.3 kg)   LMP 09/23/2011   SpO2 98%   BMI 30.13 kg/m  Wt Readings from Last 3 Encounters:  09/07/18 192 lb 6.4 oz (87.3 kg)  05/13/18 190 lb (86.2 kg)  05/07/18 191 lb (86.6 kg)   BP Readings from  Last 3 Encounters:  09/07/18 118/80  05/13/18 126/64  05/07/18 98/66     Immunization History  Administered Date(s) Administered  . Influenza Split 10/17/2011  . Influenza Whole 10/07/2010  . Influenza,inj,Quad PF,6+ Mos 10/26/2013, 10/02/2014, 11/09/2015, 10/28/2016, 09/07/2018  . Pneumococcal Conjugate-13 11/15/2013  . Tdap 06/03/2010, 06/27/2010  . Zoster Recombinat (Shingrix) 07/06/2018    Health Maintenance  Topic Date Due  . Hepatitis C Screening  10-Sep-1961  . HIV Screening  03/23/1975  . MAMMOGRAM  04/29/2018  . PAP SMEAR  04/09/2019  . TETANUS/TDAP  06/27/2020  . COLONOSCOPY  05/21/2022  . INFLUENZA VACCINE  Completed    Lab Results  Component Value Date   WBC 8.9 05/07/2018   HGB 14.1 05/07/2018   HCT 42.7 05/07/2018   PLT 306.0 05/07/2018   GLUCOSE 88 05/21/2018   CHOL 180 05/07/2018   TRIG 174.0 (H) 05/07/2018   HDL 66.10 05/07/2018   LDLDIRECT 92.0 10/28/2016   LDLCALC 80 05/07/2018   ALT 15 05/14/2018   AST 13 05/14/2018   NA 139 05/21/2018   K 5.1 05/21/2018   CL 103 05/21/2018   CREATININE 0.60 05/21/2018   BUN 11 05/21/2018   CO2 27 05/21/2018   TSH 3.93 09/07/2018   HGBA1C 5.7 05/07/2018    Lab Results  Component Value Date   TSH 3.93 09/07/2018   Lab Results  Component Value Date   WBC 8.9 05/07/2018   HGB 14.1 05/07/2018   HCT 42.7 05/07/2018   MCV 93.2 05/07/2018   PLT 306.0 05/07/2018   Lab Results  Component Value Date   NA 139 05/21/2018   K 5.1 05/21/2018   CO2 27 05/21/2018   GLUCOSE 88 05/21/2018   BUN 11 05/21/2018   CREATININE 0.60 05/21/2018   BILITOT 0.6 05/14/2018   ALKPHOS  73 05/14/2018   AST 13 05/14/2018   ALT 15 05/14/2018   PROT 7.0 05/14/2018   ALBUMIN 4.3 05/14/2018   CALCIUM 9.7 05/21/2018   GFR 109.07 05/21/2018   Lab Results  Component Value Date   CHOL 180 05/07/2018   Lab Results  Component Value Date   HDL 66.10 05/07/2018   Lab Results  Component Value Date   LDLCALC 80 05/07/2018    Lab Results  Component Value Date   TRIG 174.0 (H) 05/07/2018   Lab Results  Component Value Date   CHOLHDL 3 05/07/2018   Lab Results  Component Value Date   HGBA1C 5.7 05/07/2018         Assessment & Plan:   Problem List Items Addressed This Visit    Hypertension    Well controlled, no changes to meds. Encouraged heart healthy diet such as the DASH diet and exercise as tolerated.       Relevant Medications   propranolol ER (INDERAL LA) 80 MG 24 hr capsule   atorvastatin (LIPITOR) 40 MG tablet   losartan (COZAAR) 25 MG tablet   Hyperlipidemia    Encouraged heart healthy diet, increase exercise, avoid trans fats, consider a krill oil cap daily      Relevant Medications   propranolol ER (INDERAL LA) 80 MG 24 hr capsule   atorvastatin (LIPITOR) 40 MG tablet   losartan (COZAAR) 25 MG tablet   Nonallopathic lesion of lumbosacral region    Has had trouble with her low back off and on since Junior High when she injured it on a trampoline. She is requesting a refill on Voltaren gel and Flexeril which she used to get from her pain medicine doctor. Has been flared for the past month and is disrupting her sleep. Has not needed her Oxycodone. Encouraged moist heat and gentle stretching as tolerated. May try NSAIDs and prescription meds as directed and report if symptoms worsen or seek immediate care      Vitamin D deficiency    Supplement and monitor      Hyperglycemia    hgba1c acceptable, minimize simple carbs. Increase exercise as tolerated.       Cough in adult    Has had a dry cough over several months. Wakes her up and is worse at night. Originates on left side especially when she lies on it. Nonproductive no fevers or chills. Stop the Lisinopril and will check an ultrasound of the neck/thyroid      Relevant Orders   US SOFT TISSUE HEAD & NECK (NON-THYROID) (Completed)    Other Visit Diagnoses    Needs flu shot    -  Primary   Relevant Orders   Flu Vaccine QUAD  6+ mos PF IM (Fluarix Quad PF) (Completed)   Enlarged lymph node in neck       Relevant Orders   TSH (Completed)   T4, free (Completed)   US SOFT TISSUE HEAD & NECK (NON-THYROID) (Completed)   Neck mass       Relevant Orders   TSH (Completed)   T4, free (Completed)   US SOFT TISSUE HEAD & NECK (NON-THYROID) (Completed)      I have discontinued Jamine F. Fails's propranolol ER and lisinopril. I have also changed her propranolol ER and atorvastatin. Additionally, I am having her start on cyclobenzaprine, diclofenac sodium, and losartan. Lastly, I am having her maintain her hyoscyamine, pantoprazole, Albuterol Sulfate, oxyCODONE-acetaminophen, sertraline, and montelukast.  Meds ordered this encounter  Medications  . propranolol  ER (INDERAL LA) 80 MG 24 hr capsule    Sig: Take 1 capsule (80 mg total) by mouth at bedtime.    Dispense:  90 capsule    Refill:  1  . atorvastatin (LIPITOR) 40 MG tablet    Sig: Take 1 tablet (40 mg total) by mouth daily.    Dispense:  90 tablet    Refill:  1  . cyclobenzaprine (FLEXERIL) 10 MG tablet    Sig: Take 1 tablet (10 mg total) by mouth 2 (two) times daily as needed for muscle spasms.    Dispense:  60 tablet    Refill:  2  . diclofenac sodium (VOLTAREN) 1 % GEL    Sig: Apply 4 g topically 4 (four) times daily as needed.    Dispense:  300 g    Refill:  5  . losartan (COZAAR) 25 MG tablet    Sig: Take 1 tablet (25 mg total) by mouth daily.    Dispense:  30 tablet    Refill:  2    CMA served as scribe during this visit. History, Physical and Plan performed by medical provider. Documentation and orders reviewed and attested to.  Penni Homans, MD

## 2018-09-08 ENCOUNTER — Ambulatory Visit (HOSPITAL_BASED_OUTPATIENT_CLINIC_OR_DEPARTMENT_OTHER)
Admission: RE | Admit: 2018-09-08 | Discharge: 2018-09-08 | Disposition: A | Discharge status: Home / Self Care | Payer: 59 | Source: Ambulatory Visit | Attending: Family Medicine | Admitting: Family Medicine

## 2018-09-08 ENCOUNTER — Ambulatory Visit (HOSPITAL_BASED_OUTPATIENT_CLINIC_OR_DEPARTMENT_OTHER)
Admission: RE | Admit: 2018-09-08 | Discharge: 2018-09-08 | Disposition: A | Discharge status: Home / Self Care | Payer: 59 | Source: Ambulatory Visit | Attending: Obstetrics and Gynecology | Admitting: Obstetrics and Gynecology

## 2018-09-08 DIAGNOSIS — R590 Localized enlarged lymph nodes: Secondary | ICD-10-CM

## 2018-09-08 DIAGNOSIS — R059 Cough, unspecified: Secondary | ICD-10-CM

## 2018-09-08 DIAGNOSIS — R05 Cough: Secondary | ICD-10-CM | POA: Insufficient documentation

## 2018-09-08 DIAGNOSIS — Z1231 Encounter for screening mammogram for malignant neoplasm of breast: Secondary | ICD-10-CM | POA: Diagnosis present

## 2018-09-08 DIAGNOSIS — R221 Localized swelling, mass and lump, neck: Secondary | ICD-10-CM

## 2018-09-09 NOTE — Telephone Encounter (Signed)
Ultrasound completed 09/08/2018.

## 2018-09-14 ENCOUNTER — Ambulatory Visit (INDEPENDENT_AMBULATORY_CARE_PROVIDER_SITE_OTHER): Payer: 59

## 2018-09-14 DIAGNOSIS — Z23 Encounter for immunization: Secondary | ICD-10-CM | POA: Diagnosis not present

## 2018-11-19 ENCOUNTER — Encounter: Payer: 59 | Admitting: Family Medicine

## 2018-11-25 ENCOUNTER — Encounter: Payer: 59 | Admitting: Family Medicine

## 2018-12-05 ENCOUNTER — Other Ambulatory Visit: Payer: Self-pay | Admitting: Family Medicine

## 2018-12-06 ENCOUNTER — Other Ambulatory Visit: Payer: Self-pay | Admitting: Family Medicine

## 2018-12-07 ENCOUNTER — Encounter: Payer: 59 | Admitting: Family Medicine

## 2018-12-14 ENCOUNTER — Encounter: Payer: Self-pay | Admitting: Physician Assistant

## 2018-12-14 ENCOUNTER — Ambulatory Visit: Payer: 59 | Admitting: Physician Assistant

## 2018-12-14 VITALS — BP 116/86 | HR 90 | Ht 67.0 in | Wt 198.0 lb

## 2018-12-14 DIAGNOSIS — K648 Other hemorrhoids: Secondary | ICD-10-CM | POA: Diagnosis not present

## 2018-12-14 DIAGNOSIS — R1084 Generalized abdominal pain: Secondary | ICD-10-CM

## 2018-12-14 MED ORDER — DICYCLOMINE HCL 10 MG PO CAPS: 10.0000 mg | ORAL_CAPSULE | Freq: Two times a day (BID) | ORAL | 4 refills | Status: DC

## 2018-12-14 MED ORDER — HYDROCORTISONE ACETATE 25 MG RE SUPP: 25.0000 mg | Freq: Two times a day (BID) | RECTAL | 1 refills | Status: DC

## 2018-12-14 NOTE — Patient Instructions (Addendum)
If you are age 58 or older, your body mass index should be between 23-30. Your Body mass index is 31.01 kg/m. If this is out of the aforementioned range listed, please consider follow up with your Primary Care Provider.  If you are age 26 or younger, your body mass index should be between 19-25. Your Body mass index is 31.01 kg/m. If this is out of the aformentioned range listed, please consider follow up with your Primary Care Provider.   We have sent the following medications to your pharmacy for you to pick up at your convenience: Hydrocortisone Suppositories Dicyclomine  Follow up with me on January 18, 2019  At 10:15 am.  Thank you for choosing me and [facility].    Ellouise Newer, PA-C

## 2018-12-14 NOTE — Progress Notes (Addendum)
Chief Complaint: Rectal bleeding  HPI:    Kathy Huerta is a 57 year old female with a past medical history as listed below, known to Dr. Hilarie Fredrickson for her irritable bowel syndrome with diarrhea and GERD, who presents to clinic today for rectal bleeding.    05/21/2012 colonoscopy with 6 polyps in the transverse colon, descending colon and rectum.  Small internal hemorrhoids and otherwise normal.  Pathology revealed hyperplastic polyps and repeat was recommended in 10 years.    Today, the patient tells me that her IBS with diarrhea has been 80% better after the addition of Benefiber but she does continue with generalized lower abdominal cramping which is pretty much constant all day long and rated as a discomfort of a 3-4/10.  She also has occasional breakthrough episodes of diarrhea typically when she is stressed.      Most recently in July she woke up one night to urinate and noticed that when she stood up the toilet was full of bright red blood.  She dabbed around to figure out where this was coming from and it was from her rectum.  The next day she called our clinic but it had completely stopped so she did not seek further evaluation.  A week ago the same thing happened at night.  This then recurred again on 12/11/2018 with a bowel movement.  Every time "quite a bit of bright red blood".  Each time it occurs it is just one episode.  Denies rectal pain.    Denies fever, chills, weight loss, anorexia, nausea, vomiting, heartburn or reflux,.     Past Medical History:  Diagnosis Date  . Anxiety   . Arthritis of right hip 10/28/2016  . Basal cell carcinoma 11/17/11   Dr. Sammuel Hines  . Cutaneous skin tags 07/24/2013  . DDD (degenerative disc disease), cervical    s/p fusion  . DDD (degenerative disc disease), thoracolumbar    on disability  . Depression   . Headache(784.0) 01/29/2013  . Hearing loss    Saw ENT on 04/27/2017; Moderate hearing loss in both ears.  . Heartburn   . Hiatal hernia   . HTN  (hypertension) 2002  . Hyperlipemia 08/2014  . Hyperplastic colon polyp   . Internal hemorrhoids   . Obesity   . Obesity 04/30/2017  . Pain in joint, shoulder region 11/20/2013  . Pancreatitis 04/24/2011  . Pneumonia 07/24/2013  . Preventative health care 10/28/2016  . Tachycardia 08/12/2014  . Vitamin D deficiency 11/18/2015    Past Surgical History:  Procedure Laterality Date  . ANTERIOR FUSION CERVICAL SPINE  2008   C6-7  Dr Saintclair Halsted  . CHOLECYSTECTOMY N/A 02/27/2013   Procedure: LAPAROSCOPIC CHOLECYSTECTOMY WITH INTRAOPERATIVE CHOLANGIOGRAM;  Surgeon: Adin Hector, MD;  Location: [facility];  Service: General;  Laterality: N/A;  . POSTERIOR FUSION CERVICAL SPINE  2011   C6-7    Current Outpatient Medications  Medication Sig Dispense Refill  . atorvastatin (LIPITOR) 40 MG tablet Take 1 tablet (40 mg total) by mouth daily. 90 tablet 1  . cyclobenzaprine (FLEXERIL) 10 MG tablet Take 1 tablet (10 mg total) by mouth 2 (two) times daily as needed for muscle spasms. 60 tablet 2  . diclofenac sodium (VOLTAREN) 1 % GEL Apply 4 g topically 4 (four) times daily as needed. 300 g 5  . hyoscyamine (LEVSIN SL) 0.125 MG SL tablet TAKE 1 TO 2 TABLETS BY MOUTH EVERY 6 HOURS AS NEEDED FOR CRAMPY ABDOMINAL PAIN 120 tablet 11  . losartan (COZAAR) 25  MG tablet TAKE ONE TABLET BY MOUTH DAILY 30 tablet 1  . pantoprazole (PROTONIX) 40 MG tablet Take 1 tablet (40 mg total) by mouth daily. 30 tablet 11  . propranolol ER (INDERAL LA) 80 MG 24 hr capsule Take 1 capsule (80 mg total) by mouth at bedtime. 90 capsule 1  . sertraline (ZOLOFT) 100 MG tablet TAKE 1(1/2) TABLETS BY MOUTH DAILY 45 tablet 2   No current facility-administered medications for this visit.     Allergies as of 12/14/2018 - Review Complete 12/14/2018  Allergen Reaction Noted  . Amoxicillin-pot clavulanate  12/27/2010  . Azithromycin  12/27/2010  . Cefazolin  12/27/2010  . Moxifloxacin  12/27/2010  . Penicillins  12/27/2010  . Sulfonamide  derivatives  12/27/2010    Family History  Problem Relation Age of Onset  . Hypertension Mother   . Hyperlipidemia Mother   . Other Father        DDD, colectomy,cardiac stent, afib  . Colon polyps Father   . Hypertension Brother   . Diabetes Maternal Grandmother   . Colon cancer Neg Hx   . Stomach cancer Neg Hx   . Kidney disease Neg Hx     Social History   Socioeconomic History  . Marital status: Married    Spouse name: Not on file  . Number of children: 0  . Years of education: Not on file  . Highest education level: Not on file  Occupational History  . Occupation: Disabled    Fish farm manager: UNEMPLOYED  Social Needs  . Financial resource strain: Not on file  . Food insecurity:    Worry: Not on file    Inability: Not on file  . Transportation needs:    Medical: Not on file    Non-medical: Not on file  Tobacco Use  . Smoking status: Never Smoker  . Smokeless tobacco: Never Used  Substance and Sexual Activity  . Alcohol use: Yes    Alcohol/week: 5.0 standard drinks    Types: 5 Cans of beer per week  . Drug use: No  . Sexual activity: Not on file  Lifestyle  . Physical activity:    Days per week: Not on file    Minutes per session: Not on file  . Stress: Not on file  Relationships  . Social connections:    Talks on phone: Not on file    Gets together: Not on file    Attends religious service: Not on file    Active member of club or organization: Not on file    Attends meetings of clubs or organizations: Not on file    Relationship status: Not on file  . Intimate partner violence:    Fear of current or ex partner: Not on file    Emotionally abused: Not on file    Physically abused: Not on file    Forced sexual activity: Not on file  Other Topics Concern  . Not on file  Social History Narrative   Reviewed history and no changes required.   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   Regular exercise-no   Smoking Status:  quit   Caffeine use/day:  none   Does Patient Exercise:  no          Review of Systems:    Constitutional: No weight loss, fever or chills Cardiovascular: No chest pain Respiratory: No SOB  Gastrointestinal: See HPI  and otherwise negative   Physical Exam:  Vital signs: BP 116/86   Pulse 90   Ht 5\' 7"  (1.702 m)   Wt 198 lb (89.8 kg)   LMP 09/23/2011   BMI 31.01 kg/m    Constitutional:   Pleasant overweight Caucasian female appears to be in NAD, Well developed, Well nourished, alert and cooperative Respiratory: Respirations even and unlabored. Lungs clear to auscultation bilaterally.   No wheezes, crackles, or rhonchi.  Cardiovascular: Normal S1, S2. No MRG. Regular rate and rhythm. No peripheral edema, cyanosis or pallor.  Gastrointestinal:  Soft, nondistended, mild b/l lower ttp, No rebound or guarding. Normal bowel sounds. No appreciable masses or hepatomegaly. Rectal:  External: no hemorrhoids, no fissure; Internal: no mass or ttp, no discharge; Anoscopy: Grade II internal hemorrhoids with stigmata of recent bleeding Psychiatric: Demonstrates good judgement and reason without abnormal affect or behaviors.  No recent labs or imaging.  Assessment: 1.  Rectal bleeding: 3 isolated episodes, h/o IBS-D, hemorrhoids on exam; most likely internal hemorrhoids 2.  Lower abdominal cramping: with IBS  Plan: 1.  Anoscopy clearly shows internal hemorrhoids with stigmata of recent bleeding.  Discussed hemorrhoids with the patient. 2.  Prescribed Hydrocortisone suppositories twice daily x7 days, #14, with 1 refill. 3.  Discussed with patient she should continue her Benefiber 4.  Prescribed Dicyclomine 10 mg twice daily, every morning and before dinner.  #60 with 3 refills 5.  Discussed with patient that if she has further bleeding after suppositories above would recommend a repeat colonoscopy. 6.  Patient to follow in  clinic with me in 3-4 weeks or sooner if necessary.  Ellouise Newer, PA-C [facility] 12/14/2018, 10:15 AM  Cc: Mosie Lukes, MD   Addendum: Reviewed and agree with assessment and management plan. Pyrtle, Lajuan Lines, MD

## 2019-01-18 ENCOUNTER — Encounter: Payer: Self-pay | Admitting: Physician Assistant

## 2019-01-18 ENCOUNTER — Ambulatory Visit: Payer: 59 | Admitting: Physician Assistant

## 2019-01-18 VITALS — BP 104/86 | HR 88 | Ht 66.5 in | Wt 195.2 lb

## 2019-01-18 DIAGNOSIS — K648 Other hemorrhoids: Secondary | ICD-10-CM

## 2019-01-18 DIAGNOSIS — K582 Mixed irritable bowel syndrome: Secondary | ICD-10-CM | POA: Diagnosis not present

## 2019-01-18 DIAGNOSIS — R1084 Generalized abdominal pain: Secondary | ICD-10-CM

## 2019-01-18 NOTE — Patient Instructions (Signed)
If you are age 59 or older, your body mass index should be between 23-30. Your Body mass index is 31.04 kg/m. If this is out of the aforementioned range listed, please consider follow up with your Primary Care Provider.  If you are age 67 or younger, your body mass index should be between 19-25. Your Body mass index is 31.04 kg/m. If this is out of the aformentioned range listed, please consider follow up with your Primary Care Provider.   Follow up with me in six months.  You have been placed on recall.  Thank you for choosing me and [facility].    Ellouise Newer, PA-C

## 2019-01-18 NOTE — Progress Notes (Addendum)
Chief Complaint: Follow-up rectal bleeding, abdominal pain and IBS-D  HPI:    Kathy Huerta is a 58 year old Caucasian female with a past medical history as listed below, known to Dr. Hilarie Fredrickson for her IBS with diarrhea and reflux, who returns to clinic today for follow-up of her rectal bleeding, abdominal pain and IBS-D.    05/21/2012 colonoscopy with 6 polyps in the transverse colon, descending colon and rectum, small internal hemorrhoids and otherwise normal.  Path revealed hyperplastic polyps repeat recommended in 10 years.    12/14/2018 office visit with me discussing that her IBS with diarrhea was 80% better after the addition of Benefiber but continued generalized lower abdominal cramping and occasional breakthrough symptoms.  Also described some rectal bleeding which had started back in the summer.  Anoscopy at that visit showed grade 2 bleeding internal hemorrhoids.  Patient given Hydrocortisone suppositories twice daily x7 days with 1 refill.  Also prescribed Dicyclomine 10 mg twice daily every morning and before dinner.    Today, patient presents to clinic and explains that she is completely better.  She has seen no further bleeding since using the suppositories and her abdominal cramping and diarrhea are much better now that she is back on her regular schedule of Benefiber.  In fact, tells me now that she is back to her typical constipation with a bowel movement maybe once every 3 days, she had on yesterday which was somewhat difficult to pass.  Reports being on MiraLAX previously.  She did use the Dicyclomine twice a day for a week or 2, but since then has discontinued due to fear of side effects listed on the bottle.  Asked if she can use this intermittently as needed from now on for breakthrough symptoms.    Denies fever, chills, weight loss or other new GI symptoms.  Past Medical History:  Diagnosis Date  . Anxiety   . Arthritis of right hip 10/28/2016  . Basal cell carcinoma 11/17/11   Dr.  Sammuel Hines  . Cutaneous skin tags 07/24/2013  . DDD (degenerative disc disease), cervical    s/p fusion  . DDD (degenerative disc disease), thoracolumbar    on disability  . Depression   . Headache(784.0) 01/29/2013  . Hearing loss    Saw ENT on 04/27/2017; Moderate hearing loss in both ears.  . Heartburn   . Hiatal hernia   . HTN (hypertension) 2002  . Hyperlipemia 08/2014  . Hyperplastic colon polyp   . Internal hemorrhoids   . Obesity   . Obesity 04/30/2017  . Pain in joint, shoulder region 11/20/2013  . Pancreatitis 04/24/2011  . Pneumonia 07/24/2013  . Preventative health care 10/28/2016  . Tachycardia 08/12/2014  . Vitamin D deficiency 11/18/2015    Past Surgical History:  Procedure Laterality Date  . ANTERIOR FUSION CERVICAL SPINE  2008   C6-7  Dr Saintclair Halsted  . CHOLECYSTECTOMY N/A 02/27/2013   Procedure: LAPAROSCOPIC CHOLECYSTECTOMY WITH INTRAOPERATIVE CHOLANGIOGRAM;  Surgeon: Adin Hector, MD;  Location: [facility];  Service: General;  Laterality: N/A;  . POSTERIOR FUSION CERVICAL SPINE  2011   C6-7    Current Outpatient Medications  Medication Sig Dispense Refill  . atorvastatin (LIPITOR) 40 MG tablet Take 1 tablet (40 mg total) by mouth daily. 90 tablet 1  . cyclobenzaprine (FLEXERIL) 10 MG tablet Take 1 tablet (10 mg total) by mouth 2 (two) times daily as needed for muscle spasms. 60 tablet 2  . diclofenac sodium (VOLTAREN) 1 % GEL Apply 4 g topically 4 (four)  times daily as needed. 300 g 5  . dicyclomine (BENTYL) 10 MG capsule Take 1 capsule (10 mg total) by mouth 2 (two) times daily. 20 capsule 4  . hydrocortisone (ANUSOL-HC) 25 MG suppository Place 1 suppository (25 mg total) rectally 2 (two) times daily. (Patient taking differently: Place 25 mg rectally as needed. ) 14 suppository 1  . hyoscyamine (LEVSIN SL) 0.125 MG SL tablet TAKE 1 TO 2 TABLETS BY MOUTH EVERY 6 HOURS AS NEEDED FOR CRAMPY ABDOMINAL PAIN 120 tablet 11  . losartan (COZAAR) 25 MG tablet TAKE ONE TABLET BY MOUTH  DAILY 30 tablet 1  . pantoprazole (PROTONIX) 40 MG tablet Take 1 tablet (40 mg total) by mouth daily. 30 tablet 11  . propranolol ER (INDERAL LA) 80 MG 24 hr capsule Take 1 capsule (80 mg total) by mouth at bedtime. 90 capsule 1  . sertraline (ZOLOFT) 100 MG tablet TAKE 1(1/2) TABLETS BY MOUTH DAILY 45 tablet 2   No current facility-administered medications for this visit.     Allergies as of 01/18/2019 - Review Complete 01/18/2019  Allergen Reaction Noted  . Amoxicillin-pot clavulanate  12/27/2010  . Azithromycin  12/27/2010  . Cefazolin  12/27/2010  . Moxifloxacin  12/27/2010  . Penicillins  12/27/2010  . Sulfonamide derivatives  12/27/2010    Family History  Problem Relation Age of Onset  . Hypertension Mother   . Hyperlipidemia Mother   . Other Father        DDD, colectomy,cardiac stent, afib  . Colon polyps Father   . Hypertension Brother   . Diabetes Maternal Grandmother   . Colon cancer Neg Hx   . Stomach cancer Neg Hx   . Kidney disease Neg Hx     Social History   Socioeconomic History  . Marital status: Married    Spouse name: Not on file  . Number of children: 0  . Years of education: Not on file  . Highest education level: Not on file  Occupational History  . Occupation: Disabled    Fish farm manager: UNEMPLOYED  Social Needs  . Financial resource strain: Not on file  . Food insecurity:    Worry: Not on file    Inability: Not on file  . Transportation needs:    Medical: Not on file    Non-medical: Not on file  Tobacco Use  . Smoking status: Never Smoker  . Smokeless tobacco: Never Used  Substance and Sexual Activity  . Alcohol use: Yes    Alcohol/week: 5.0 standard drinks    Types: 5 Cans of beer per week  . Drug use: No  . Sexual activity: Not on file  Lifestyle  . Physical activity:    Days per week: Not on file    Minutes per session: Not on file  . Stress: Not on file  Relationships  . Social connections:    Talks on phone: Not on file    Gets  together: Not on file    Attends religious service: Not on file    Active member of club or organization: Not on file    Attends meetings of clubs or organizations: Not on file    Relationship status: Not on file  . Intimate partner violence:    Fear of current or ex partner: Not on file    Emotionally abused: Not on file    Physically abused: Not on file    Forced sexual activity: Not on file  Other Topics Concern  . Not on file  Social  History Narrative   Reviewed history and no changes required.   Occupation: Disabled,  has worked in the past for a [employer] (medical))   Married   Alcohol use-yes- every weekend 4-5 beers.   Regular exercise-no   Smoking Status:  quit   Caffeine use/day:  none   Does Patient Exercise:  no          Review of Systems:    Constitutional: No weight loss, fever or chills Cardiovascular: No chest pain Respiratory: No SOB  Gastrointestinal: See HPI and otherwise negative   Physical Exam:  Vital signs: BP 104/86   Pulse 88   Ht 5' 6.5" (1.689 m)   Wt 195 lb 4 oz (88.6 kg)   LMP 09/23/2011   BMI 31.04 kg/m   Constitutional:   Pleasant overweight Caucasian female appears to be in NAD, Well developed, Well nourished, alert and cooperative Respiratory: Respirations even and unlabored. Lungs clear to auscultation bilaterally.   No wheezes, crackles, or rhonchi.  Cardiovascular: Normal S1, S2. No MRG. Regular rate and rhythm. No peripheral edema, cyanosis or pallor.  Gastrointestinal:  Soft, nondistended, nontender. No rebound or guarding. Normal bowel sounds. No appreciable masses or hepatomegaly. Rectal:  Not performed.  Psychiatric: Demonstrates good judgement and reason without abnormal affect or behaviors.  No recent labs.  Assessment: 1.  Bleeding internal hemorrhoids: Completely resolved after Hydrocortisone suppositories twice daily x7 days 2.  Lower abdominal cramping: Much better now that she is back on her fiber  regimen 3.  IBS: With constipation and diarrhea, currently constipated  Plan: 1.  Patient reports that symptoms are much better.  She may continue the Dicyclomine 10 mg as needed from now on.  She can call for refills. 2.  Patient does have some suppositories on hand in case she starts bleeding again. 3.  Encouraged the patient to restart her MiraLAX, even if it is a half of a dose a day with her daily Benefiber to encourage regular bowel movements.  Discussed titration of this. 4.  Patient to follow in clinic with me in 6 months to check in.  Ellouise Newer, PA-C [facility] 01/18/2019, 10:21 AM  Cc: Mosie Lukes, MD   Addendum: Reviewed and agree with assessment and management plan. Pyrtle, Lajuan Lines, MD

## 2019-01-31 ENCOUNTER — Other Ambulatory Visit: Payer: Self-pay | Admitting: Internal Medicine

## 2019-02-01 ENCOUNTER — Ambulatory Visit (INDEPENDENT_AMBULATORY_CARE_PROVIDER_SITE_OTHER): Payer: 59 | Admitting: Family Medicine

## 2019-02-01 ENCOUNTER — Encounter: Payer: Self-pay | Admitting: Family Medicine

## 2019-02-01 VITALS — BP 110/78 | HR 80 | Temp 98.0°F | Resp 18 | Wt 197.0 lb

## 2019-02-01 DIAGNOSIS — E559 Vitamin D deficiency, unspecified: Secondary | ICD-10-CM

## 2019-02-01 DIAGNOSIS — Z7289 Other problems related to lifestyle: Secondary | ICD-10-CM

## 2019-02-01 DIAGNOSIS — I10 Essential (primary) hypertension: Secondary | ICD-10-CM | POA: Diagnosis not present

## 2019-02-01 DIAGNOSIS — Z0000 Encounter for general adult medical examination without abnormal findings: Secondary | ICD-10-CM | POA: Diagnosis not present

## 2019-02-01 DIAGNOSIS — R000 Tachycardia, unspecified: Secondary | ICD-10-CM

## 2019-02-01 DIAGNOSIS — E782 Mixed hyperlipidemia: Secondary | ICD-10-CM

## 2019-02-01 DIAGNOSIS — R739 Hyperglycemia, unspecified: Secondary | ICD-10-CM

## 2019-02-01 NOTE — Assessment & Plan Note (Signed)
hgba1c acceptable, minimize simple carbs. Increase exercise as tolerated.  

## 2019-02-01 NOTE — Assessment & Plan Note (Signed)
Patient encouraged to maintain heart healthy diet, regular exercise, adequate sleep. Consider daily probiotics. Take medications as prescribed. Given and reviewed copy of ACP documents from [place] Secretary of State and encouraged to complete and return 

## 2019-02-01 NOTE — Assessment & Plan Note (Signed)
Well controlled, no changes to meds. Encouraged heart healthy diet such as the DASH diet and exercise as tolerated.  °

## 2019-02-01 NOTE — Assessment & Plan Note (Signed)
Supplement and monitor 

## 2019-02-01 NOTE — Patient Instructions (Addendum)
Encouraged increased hydration and fiber in diet. Daily probiotics. If bowels not moving can use MOM 2 tbls po in 4 oz of warm prune juice by mouth every 2-3 days. If no results then repeat in 4 hours with  Dulcolax suppository pr, may repeat again in 4 more hours as needed. Seek care if symptoms worsen. Consider daily Miralax and/or Dulcolax if symptoms persist.   Miralax with Benefiber once to twice daily  Would like a copy of your advanced directives, HCP and Living will  Preventive Care 40-64 Years, Female Preventive care refers to lifestyle choices and visits with your health care provider that can promote health and wellness. What does preventive care include?   A yearly physical exam. This is also called an annual well check.  Dental exams once or twice a year.  Routine eye exams. Ask your health care provider how often you should have your eyes checked.  Personal lifestyle choices, including: ? Daily care of your teeth and gums. ? Regular physical activity. ? Eating a healthy diet. ? Avoiding tobacco and drug use. ? Limiting alcohol use. ? Practicing safe sex. ? Taking low-dose aspirin daily starting at age 30. ? Taking vitamin and mineral supplements as recommended by your health care provider. What happens during an annual well check? The services and screenings done by your health care provider during your annual well check will depend on your age, overall health, lifestyle risk factors, and family history of disease. Counseling Your health care provider may ask you questions about your:  Alcohol use.  Tobacco use.  Drug use.  Emotional well-being.  Home and relationship well-being.  Sexual activity.  Eating habits.  Work and work Statistician.  Method of birth control.  Menstrual cycle.  Pregnancy history. Screening You may have the following tests or measurements:  Height, weight, and BMI.  Blood pressure.  Lipid and cholesterol levels. These may  be checked every 5 years, or more frequently if you are over 37 years old.  Skin check.  Lung cancer screening. You may have this screening every year starting at age 105 if you have a 30-pack-year history of smoking and currently smoke or have quit within the past 15 years.  Colorectal cancer screening. All adults should have this screening starting at age 61 and continuing until age 31. Your health care provider may recommend screening at age 76. You will have tests every 1-10 years, depending on your results and the type of screening test. People at increased risk should start screening at an earlier age. Screening tests may include: ? Guaiac-based fecal occult blood testing. ? Fecal immunochemical test (FIT). ? Stool DNA test. ? Virtual colonoscopy. ? Sigmoidoscopy. During this test, a flexible tube with a tiny camera (sigmoidoscope) is used to examine your rectum and lower colon. The sigmoidoscope is inserted through your anus into your rectum and lower colon. ? Colonoscopy. During this test, a long, thin, flexible tube with a tiny camera (colonoscope) is used to examine your entire colon and rectum.  Hepatitis C blood test.  Hepatitis B blood test.  Sexually transmitted disease (STD) testing.  Diabetes screening. This is done by checking your blood sugar (glucose) after you have not eaten for a while (fasting). You may have this done every 1-3 years.  Mammogram. This may be done every 1-2 years. Talk to your health care provider about when you should start having regular mammograms. This may depend on whether you have a family history of breast cancer.  BRCA-related  cancer screening. This may be done if you have a family history of breast, ovarian, tubal, or peritoneal cancers.  Pelvic exam and Pap test. This may be done every 3 years starting at age 6. Starting at age 65, this may be done every 5 years if you have a Pap test in combination with an HPV test.  Bone density scan. This  is done to screen for osteoporosis. You may have this scan if you are at high risk for osteoporosis. Discuss your test results, treatment options, and if necessary, the need for more tests with your health care provider. Vaccines Your health care provider may recommend certain vaccines, such as:  Influenza vaccine. This is recommended every year.  Tetanus, diphtheria, and acellular pertussis (Tdap, Td) vaccine. You may need a Td booster every 10 years.  Varicella vaccine. You may need this if you have not been vaccinated.  Zoster vaccine. You may need this after age 22.  Measles, mumps, and rubella (MMR) vaccine. You may need at least one dose of MMR if you were born in 1957 or later. You may also need a second dose.  Pneumococcal 13-valent conjugate (PCV13) vaccine. You may need this if you have certain conditions and were not previously vaccinated.  Pneumococcal polysaccharide (PPSV23) vaccine. You may need one or two doses if you smoke cigarettes or if you have certain conditions.  Meningococcal vaccine. You may need this if you have certain conditions.  Hepatitis A vaccine. You may need this if you have certain conditions or if you travel or work in places where you may be exposed to hepatitis A.  Hepatitis B vaccine. You may need this if you have certain conditions or if you travel or work in places where you may be exposed to hepatitis B.  Haemophilus influenzae type b (Hib) vaccine. You may need this if you have certain conditions. Talk to your health care provider about which screenings and vaccines you need and how often you need them. This information is not intended to replace advice given to you by your health care provider. Make sure you discuss any questions you have with your health care provider. Document Released: 01/04/2016 Document Revised: 01/28/2018 Document Reviewed: 10/09/2015 Elsevier Interactive Patient Education  2019 [employer].

## 2019-02-01 NOTE — Assessment & Plan Note (Signed)
RRR today 

## 2019-02-01 NOTE — Assessment & Plan Note (Signed)
Tolerating statin, encouraged heart healthy diet, avoid trans fats, minimize simple carbs and saturated fats. Increase exercise as tolerated 

## 2019-02-01 NOTE — Progress Notes (Signed)
Subjective:    Patient ID: Kathy Huerta, female    DOB: Dec 25, 1960, 58 y.o.   MRN: [id number]  No chief complaint on file.   HPI Patient is in today for annual preventative exam and follow-up on chronic medical concerns including hypertension, hyperlipidemia, hyperglycemia and more.  She feels well today.  No recent febrile illness or hospitalizations.  She is managing her activities of daily living well.  She tries to maintain a heart healthy diet and stay active. Denies CP/palp/SOB/HA/congestion/fevers/GI or GU c/o. Taking meds as prescribed  Past Medical History:  Diagnosis Date  . Anxiety   . Arthritis of right hip 10/28/2016  . Basal cell carcinoma 11/17/11   Dr. Sammuel Hines  . Cutaneous skin tags 07/24/2013  . DDD (degenerative disc disease), cervical    s/p fusion  . DDD (degenerative disc disease), thoracolumbar    on disability  . Depression   . Headache(784.0) 01/29/2013  . Hearing loss    Saw ENT on 04/27/2017; Moderate hearing loss in both ears.  . Heartburn   . Hiatal hernia   . HTN (hypertension) 2002  . Hyperlipemia 08/2014  . Hyperplastic colon polyp   . Internal hemorrhoids   . Obesity   . Obesity 04/30/2017  . Pain in joint, shoulder region 11/20/2013  . Pancreatitis 04/24/2011  . Pneumonia 07/24/2013  . Preventative health care 10/28/2016  . Tachycardia 08/12/2014  . Vitamin D deficiency 11/18/2015    Past Surgical History:  Procedure Laterality Date  . ANTERIOR FUSION CERVICAL SPINE  2008   C6-7  Dr Saintclair Halsted  . CHOLECYSTECTOMY N/A 02/27/2013   Procedure: LAPAROSCOPIC CHOLECYSTECTOMY WITH INTRAOPERATIVE CHOLANGIOGRAM;  Surgeon: Adin Hector, MD;  Location: [facility];  Service: General;  Laterality: N/A;  . POSTERIOR FUSION CERVICAL SPINE  2011   C6-7    Family History  Problem Relation Age of Onset  . Hypertension Mother   . Hyperlipidemia Mother   . Other Father        DDD, colectomy,cardiac stent, afib  . Colon polyps Father   . Hypertension Brother   .  Diabetes Maternal Grandmother   . Colon cancer Neg Hx   . Stomach cancer Neg Hx   . Kidney disease Neg Hx     Social History   Socioeconomic History  . Marital status: Married    Spouse name: Not on file  . Number of children: 0  . Years of education: Not on file  . Highest education level: Not on file  Occupational History  . Occupation: Disabled    Fish farm manager: UNEMPLOYED  Social Needs  . Financial resource strain: Not on file  . Food insecurity:    Worry: Not on file    Inability: Not on file  . Transportation needs:    Medical: Not on file    Non-medical: Not on file  Tobacco Use  . Smoking status: Never Smoker  . Smokeless tobacco: Never Used  Substance and Sexual Activity  . Alcohol use: Yes    Alcohol/week: 5.0 standard drinks    Types: 5 Cans of beer per week  . Drug use: No  . Sexual activity: Not on file  Lifestyle  . Physical activity:    Days per week: Not on file    Minutes per session: Not on file  . Stress: Not on file  Relationships  . Social connections:    Talks on phone: Not on file    Gets together: Not on file    Attends religious  service: Not on file    Active member of club or organization: Not on file    Attends meetings of clubs or organizations: Not on file    Relationship status: Not on file  . Intimate partner violence:    Fear of current or ex partner: Not on file    Emotionally abused: Not on file    Physically abused: Not on file    Forced sexual activity: Not on file  Other Topics Concern  . Not on file  Social History Narrative   Reviewed history and no changes required.   Occupation: Disabled,  has worked in the past for a [employer] (medical))   Married   Alcohol use-yes- every weekend 4-5 beers.   Regular exercise-no   Smoking Status:  quit   Caffeine use/day:  none   Does Patient Exercise:  no          Outpatient Medications Prior to Visit  Medication Sig Dispense Refill  . atorvastatin (LIPITOR) 40 MG  tablet Take 1 tablet (40 mg total) by mouth daily. 90 tablet 1  . cyclobenzaprine (FLEXERIL) 10 MG tablet Take 1 tablet (10 mg total) by mouth 2 (two) times daily as needed for muscle spasms. 60 tablet 2  . diclofenac sodium (VOLTAREN) 1 % GEL Apply 4 g topically 4 (four) times daily as needed. 300 g 5  . hyoscyamine (LEVSIN SL) 0.125 MG SL tablet TAKE 1 TO 2 TABLETS BY MOUTH EVERY 6 HOURS AS NEEDED FOR CRAMPY ABDOMINAL PAIN 120 tablet 11  . losartan (COZAAR) 25 MG tablet TAKE ONE TABLET BY MOUTH DAILY 30 tablet 1  . pantoprazole (PROTONIX) 40 MG tablet TAKE ONE TABLET BY MOUTH DAILY 30 tablet 3  . propranolol ER (INDERAL LA) 80 MG 24 hr capsule Take 1 capsule (80 mg total) by mouth at bedtime. 90 capsule 1  . sertraline (ZOLOFT) 100 MG tablet TAKE 1(1/2) TABLETS BY MOUTH DAILY 45 tablet 2  . dicyclomine (BENTYL) 10 MG capsule Take 1 capsule (10 mg total) by mouth 2 (two) times daily. 20 capsule 4  . hydrocortisone (ANUSOL-HC) 25 MG suppository Place 1 suppository (25 mg total) rectally 2 (two) times daily. (Patient taking differently: Place 25 mg rectally as needed. ) 14 suppository 1   No facility-administered medications prior to visit.     Allergies  Allergen Reactions  . Amoxicillin-Pot Clavulanate     REACTION: Throat swelling.  . Azithromycin     REACTION: Throat Swelling  . Cefazolin     REACTION: Throat Swelling  . Moxifloxacin     REACTION: Throat Swelling  . Penicillins     REACTION: throat swelling  . Sulfonamide Derivatives     Review of Systems  Constitutional: Negative for chills, fever and malaise/fatigue.  HENT: Negative for congestion and hearing loss.   Eyes: Negative for discharge.  Respiratory: Negative for cough, sputum production and shortness of breath.   Cardiovascular: Negative for chest pain, palpitations and leg swelling.  Gastrointestinal: Negative for abdominal pain, blood in stool, constipation, diarrhea, heartburn, nausea and vomiting.    Genitourinary: Negative for dysuria, frequency, hematuria and urgency.  Musculoskeletal: Negative for back pain, falls and myalgias.  Skin: Negative for rash.  Neurological: Negative for dizziness, sensory change, loss of consciousness, weakness and headaches.  Endo/Heme/Allergies: Negative for environmental allergies. Does not bruise/bleed easily.  Psychiatric/Behavioral: Negative for depression and suicidal ideas. The patient is not nervous/anxious and does not have insomnia.        Objective:  Physical Exam Constitutional:      General: She is not in acute distress.    Appearance: She is well-developed.  HENT:     Head: Normocephalic and atraumatic.  Eyes:     Conjunctiva/sclera: Conjunctivae normal.  Neck:     Musculoskeletal: Neck supple.     Thyroid: No thyromegaly.  Cardiovascular:     Rate and Rhythm: Normal rate and regular rhythm.     Heart sounds: Normal heart sounds. No murmur.  Pulmonary:     Effort: Pulmonary effort is normal. No respiratory distress.     Breath sounds: Normal breath sounds.  Abdominal:     General: Bowel sounds are normal. There is no distension.     Palpations: Abdomen is soft. There is no mass.     Tenderness: There is no abdominal tenderness.  Lymphadenopathy:     Cervical: No cervical adenopathy.  Skin:    General: Skin is warm and dry.  Neurological:     Mental Status: She is alert and oriented to person, place, and time.  Psychiatric:        Behavior: Behavior normal.     BP 110/78 (BP Location: Left Arm, Patient Position: Sitting, Cuff Size: Normal)   Pulse 80   Temp 98 F (36.7 C) (Oral)   Resp 18   Wt 197 lb (89.4 kg)   LMP 09/23/2011   SpO2 97%   BMI 31.32 kg/m  Wt Readings from Last 3 Encounters:  02/01/19 197 lb (89.4 kg)  01/18/19 195 lb 4 oz (88.6 kg)  12/14/18 198 lb (89.8 kg)     Lab Results  Component Value Date   WBC 8.9 05/07/2018   HGB 14.1 05/07/2018   HCT 42.7 05/07/2018   PLT 306.0 05/07/2018    GLUCOSE 88 05/21/2018   CHOL 180 05/07/2018   TRIG 174.0 (H) 05/07/2018   HDL 66.10 05/07/2018   LDLDIRECT 92.0 10/28/2016   LDLCALC 80 05/07/2018   ALT 15 05/14/2018   AST 13 05/14/2018   NA 139 05/21/2018   K 5.1 05/21/2018   CL 103 05/21/2018   CREATININE 0.60 05/21/2018   BUN 11 05/21/2018   CO2 27 05/21/2018   TSH 3.93 09/07/2018   HGBA1C 5.7 05/07/2018    Lab Results  Component Value Date   TSH 3.93 09/07/2018   Lab Results  Component Value Date   WBC 8.9 05/07/2018   HGB 14.1 05/07/2018   HCT 42.7 05/07/2018   MCV 93.2 05/07/2018   PLT 306.0 05/07/2018   Lab Results  Component Value Date   NA 139 05/21/2018   K 5.1 05/21/2018   CO2 27 05/21/2018   GLUCOSE 88 05/21/2018   BUN 11 05/21/2018   CREATININE 0.60 05/21/2018   BILITOT 0.6 05/14/2018   ALKPHOS 73 05/14/2018   AST 13 05/14/2018   ALT 15 05/14/2018   PROT 7.0 05/14/2018   ALBUMIN 4.3 05/14/2018   CALCIUM 9.7 05/21/2018   GFR 109.07 05/21/2018   Lab Results  Component Value Date   CHOL 180 05/07/2018   Lab Results  Component Value Date   HDL 66.10 05/07/2018   Lab Results  Component Value Date   LDLCALC 80 05/07/2018   Lab Results  Component Value Date   TRIG 174.0 (H) 05/07/2018   Lab Results  Component Value Date   CHOLHDL 3 05/07/2018   Lab Results  Component Value Date   HGBA1C 5.7 05/07/2018       Assessment & Plan:   Problem  List Items Addressed This Visit    Hypertension    Well controlled, no changes to meds. Encouraged heart healthy diet such as the DASH diet and exercise as tolerated.       Relevant Orders   CBC   Comprehensive metabolic panel   TSH   Hyperlipidemia    Tolerating statin, encouraged heart healthy diet, avoid trans fats, minimize simple carbs and saturated fats. Increase exercise as tolerated      Relevant Orders   Lipid panel   Tachycardia    RRR today      Vitamin D deficiency    Supplement and monitor      Relevant Orders    VITAMIN D 25 Hydroxy (Vit-D Deficiency, Fractures)   Preventative health care    Patient encouraged to maintain heart healthy diet, regular exercise, adequate sleep. Consider daily probiotics. Take medications as prescribed. Given and reviewed copy of ACP documents from [place] and encouraged to complete and return      Hyperglycemia    hgba1c acceptable, minimize simple carbs. Increase exercise as tolerated.       Relevant Orders   Hemoglobin A1c      I have discontinued Jayda F. Buskey's hydrocortisone and dicyclomine. I am also having her maintain her hyoscyamine, propranolol ER, atorvastatin, cyclobenzaprine, diclofenac sodium, losartan, sertraline, and pantoprazole.  No orders of the defined types were placed in this encounter.   Penni Homans, MD

## 2019-02-02 LAB — CBC
HEMATOCRIT: 41.1 % (ref 36.0–46.0)
Hemoglobin: 13.9 g/dL (ref 12.0–15.0)
MCHC: 33.7 g/dL (ref 30.0–36.0)
MCV: 91.9 fl (ref 78.0–100.0)
Platelets: 305 10*3/uL (ref 150.0–400.0)
RBC: 4.47 Mil/uL (ref 3.87–5.11)
RDW: 13.2 % (ref 11.5–15.5)
WBC: 8.6 10*3/uL (ref 4.0–10.5)

## 2019-02-02 LAB — COMPREHENSIVE METABOLIC PANEL
ALT: 21 U/L (ref 0–35)
AST: 23 U/L (ref 0–37)
Albumin: 4.5 g/dL (ref 3.5–5.2)
Alkaline Phosphatase: 94 U/L (ref 39–117)
BUN: 12 mg/dL (ref 6–23)
CHLORIDE: 103 meq/L (ref 96–112)
CO2: 24 meq/L (ref 19–32)
CREATININE: 0.58 mg/dL (ref 0.40–1.20)
Calcium: 9.7 mg/dL (ref 8.4–10.5)
GFR: 106.45 mL/min (ref >60.00)
Glucose, Bld: 90 mg/dL (ref 70–99)
POTASSIUM: 4.4 meq/L (ref 3.5–5.1)
SODIUM: 139 meq/L (ref 135–145)
Total Bilirubin: 0.6 mg/dL (ref 0.2–1.2)
Total Protein: 7.5 g/dL (ref 6.0–8.3)

## 2019-02-02 LAB — VITAMIN D 25 HYDROXY (VIT D DEFICIENCY, FRACTURES): VITD: 23.51 ng/mL — ABNORMAL LOW (ref 30.00–100.00)

## 2019-02-02 LAB — LIPID PANEL
CHOL/HDL RATIO: 3
Cholesterol: 181 mg/dL (ref 0–200)
HDL: 64.7 mg/dL (ref >39.00)
LDL Cholesterol: 87 mg/dL (ref 0–99)
NONHDL: 116.26
Triglycerides: 146 mg/dL (ref 0.0–149.0)
VLDL: 29.2 mg/dL (ref 0.0–40.0)

## 2019-02-02 LAB — HEMOGLOBIN A1C: Hgb A1c MFr Bld: 5.5 % (ref 4.6–6.5)

## 2019-02-02 LAB — TSH: TSH: 3.22 u[IU]/mL (ref 0.35–4.50)

## 2019-02-03 LAB — HEPATITIS C ANTIBODY
HEP C AB: NONREACTIVE
SIGNAL TO CUT-OFF: 0.01 (ref <1.00)

## 2019-02-03 MED ORDER — VITAMIN D (ERGOCALCIFEROL) 1.25 MG (50000 UNIT) PO CAPS: 50000.0000 [IU] | ORAL_CAPSULE | ORAL | 4 refills | Status: DC

## 2019-02-03 NOTE — Addendum Note (Signed)
Addended by: Magdalene Molly A on: 02/03/2019 03:35 PM   Modules accepted: Orders

## 2019-02-04 ENCOUNTER — Telehealth: Payer: Self-pay | Admitting: *Deleted

## 2019-02-04 NOTE — Telephone Encounter (Signed)
Received Medical records from [facility] - Dr. Landry Mellow, MD; forwarded to provider/SLS 02/14

## 2019-02-09 ENCOUNTER — Other Ambulatory Visit: Payer: Self-pay | Admitting: Family Medicine

## 2019-02-11 ENCOUNTER — Encounter: Payer: Self-pay | Admitting: Family Medicine

## 2019-02-25 ENCOUNTER — Other Ambulatory Visit: Payer: Self-pay | Admitting: Family Medicine

## 2019-03-08 ENCOUNTER — Other Ambulatory Visit: Payer: Self-pay | Admitting: Family Medicine

## 2019-03-24 ENCOUNTER — Other Ambulatory Visit: Payer: Self-pay | Admitting: Family Medicine

## 2019-04-04 ENCOUNTER — Other Ambulatory Visit: Payer: Self-pay | Admitting: Family Medicine

## 2019-04-20 ENCOUNTER — Other Ambulatory Visit: Payer: Self-pay | Admitting: Family Medicine

## 2019-04-30 ENCOUNTER — Other Ambulatory Visit: Payer: Self-pay | Admitting: Family Medicine

## 2019-05-04 ENCOUNTER — Other Ambulatory Visit: Payer: Self-pay | Admitting: Family Medicine

## 2019-05-30 ENCOUNTER — Other Ambulatory Visit: Payer: Self-pay | Admitting: Physician Assistant

## 2019-06-02 ENCOUNTER — Other Ambulatory Visit: Payer: Self-pay | Admitting: Family Medicine

## 2019-06-08 ENCOUNTER — Other Ambulatory Visit: Payer: Self-pay | Admitting: Internal Medicine

## 2019-06-30 ENCOUNTER — Other Ambulatory Visit: Payer: Self-pay

## 2019-06-30 MED ORDER — PANTOPRAZOLE SODIUM 40 MG PO TBEC: 40.0000 mg | DELAYED_RELEASE_TABLET | Freq: Every day | ORAL | 1 refills | Status: DC

## 2019-07-02 ENCOUNTER — Other Ambulatory Visit: Payer: Self-pay | Admitting: Family Medicine

## 2019-07-06 ENCOUNTER — Other Ambulatory Visit: Payer: Self-pay | Admitting: Internal Medicine

## 2019-07-11 ENCOUNTER — Other Ambulatory Visit: Payer: Self-pay | Admitting: Family Medicine

## 2019-07-16 ENCOUNTER — Other Ambulatory Visit: Payer: Self-pay | Admitting: Family Medicine

## 2019-08-02 ENCOUNTER — Other Ambulatory Visit: Payer: Self-pay

## 2019-08-02 ENCOUNTER — Ambulatory Visit (INDEPENDENT_AMBULATORY_CARE_PROVIDER_SITE_OTHER): Payer: 59 | Admitting: Family Medicine

## 2019-08-02 ENCOUNTER — Encounter: Payer: Self-pay | Admitting: Family Medicine

## 2019-08-02 VITALS — BP 115/70 | Wt 197.0 lb

## 2019-08-02 DIAGNOSIS — R739 Hyperglycemia, unspecified: Secondary | ICD-10-CM

## 2019-08-02 DIAGNOSIS — E559 Vitamin D deficiency, unspecified: Secondary | ICD-10-CM

## 2019-08-02 DIAGNOSIS — I10 Essential (primary) hypertension: Secondary | ICD-10-CM

## 2019-08-02 DIAGNOSIS — E782 Mixed hyperlipidemia: Secondary | ICD-10-CM | POA: Diagnosis not present

## 2019-08-03 ENCOUNTER — Encounter: Payer: Self-pay | Admitting: Internal Medicine

## 2019-08-03 NOTE — Progress Notes (Signed)
Virtual Visit via Video Note  I connected with Kathy Huerta on  08/02/2019 at  3:00 PM EDT by a video enabled telemedicine application and verified that I am speaking with the correct person using two identifiers.  Location: Patient: home Provider: office   I discussed the limitations of evaluation and management by telemedicine and the availability of in person appointments. The patient expressed understanding and agreed to proceed. Kathy Huerta, CMA was able to get the patient set up on visit, video   Subjective:    Patient ID: Kathy Huerta, female    DOB: 06/27/1961, 58 y.o.   MRN: [id number]  No chief complaint on file.   HPI Patient is in today for follow up on chronic medical concerns including hyperlipidemia, hyperglycemia and hypertension. Over all she is doing well. No recent febrile illness or hospitalizations. She has been quarantining since mid March and has done well. She is noting some intermittent episodes of her legs aching and feeling a pulsating feeling in her lower legs. None at present. No swelling or skin changes. Denies CP/palp/SOB/HA/congestion/fevers/GI or GU c/o. Taking meds as prescribed  Past Medical History:  Diagnosis Date  . Anxiety   . Arthritis of right hip 10/28/2016  . Basal cell carcinoma 11/17/11   Dr. Sammuel Hines  . Cutaneous skin tags 07/24/2013  . DDD (degenerative disc disease), cervical    s/p fusion  . DDD (degenerative disc disease), thoracolumbar    on disability  . Depression   . Headache(784.0) 01/29/2013  . Hearing loss    Saw ENT on 04/27/2017; Moderate hearing loss in both ears.  . Heartburn   . Hiatal hernia   . HTN (hypertension) 2002  . Hyperlipemia 08/2014  . Hyperplastic colon polyp   . Internal hemorrhoids   . Obesity   . Obesity 04/30/2017  . Pain in joint, shoulder region 11/20/2013  . Pancreatitis 04/24/2011  . Pneumonia 07/24/2013  . Preventative health care 10/28/2016  . Tachycardia 08/12/2014  . Vitamin D deficiency  11/18/2015    Past Surgical History:  Procedure Laterality Date  . ANTERIOR FUSION CERVICAL SPINE  2008   C6-7  Dr Saintclair Halsted  . CHOLECYSTECTOMY N/A 02/27/2013   Procedure: LAPAROSCOPIC CHOLECYSTECTOMY WITH INTRAOPERATIVE CHOLANGIOGRAM;  Surgeon: Adin Hector, MD;  Location: [facility];  Service: General;  Laterality: N/A;  . POSTERIOR FUSION CERVICAL SPINE  2011   C6-7    Family History  Problem Relation Age of Onset  . Hypertension Mother   . Hyperlipidemia Mother   . Other Father        DDD, colectomy,cardiac stent, afib  . Colon polyps Father   . Hypertension Brother   . Diabetes Maternal Grandmother   . Colon cancer Neg Hx   . Stomach cancer Neg Hx   . Kidney disease Neg Hx     Social History   Socioeconomic History  . Marital status: Married    Spouse name: Not on file  . Number of children: 0  . Years of education: Not on file  . Highest education level: Not on file  Occupational History  . Occupation: Disabled    Fish farm manager: UNEMPLOYED  Social Needs  . Financial resource strain: Not on file  . Food insecurity    Worry: Not on file    Inability: Not on file  . Transportation needs    Medical: Not on file    Non-medical: Not on file  Tobacco Use  . Smoking status: Never Smoker  . Smokeless tobacco:  Never Used  Substance and Sexual Activity  . Alcohol use: Yes    Alcohol/week: 5.0 standard drinks    Types: 5 Cans of beer per week  . Drug use: No  . Sexual activity: Not on file  Lifestyle  . Physical activity    Days per week: Not on file    Minutes per session: Not on file  . Stress: Not on file  Relationships  . Social Herbalist on phone: Not on file    Gets together: Not on file    Attends religious service: Not on file    Active member of club or organization: Not on file    Attends meetings of clubs or organizations: Not on file    Relationship status: Not on file  . Intimate partner violence    Fear of current or ex partner: Not on file     Emotionally abused: Not on file    Physically abused: Not on file    Forced sexual activity: Not on file  Other Topics Concern  . Not on file  Social History Narrative   Reviewed history and no changes required.   Occupation: Disabled,  has worked in the past for a [employer] (medical))   Married   Alcohol use-yes- every weekend 4-5 beers.   Regular exercise-no   Smoking Status:  quit   Caffeine use/day:  none   Does Patient Exercise:  no          Outpatient Medications Prior to Visit  Medication Sig Dispense Refill  . atorvastatin (LIPITOR) 40 MG tablet TAKE ONE TABLET BY MOUTH DAILY 30 tablet 2  . cyclobenzaprine (FLEXERIL) 10 MG tablet TAKE ONE TABLET BY MOUTH TWICE A DAY AS NEEDED FOR MUSCLE SPASMS 60 tablet 0  . diclofenac sodium (VOLTAREN) 1 % GEL Apply 4 g topically 4 (four) times daily as needed. 300 g 5  . dicyclomine (BENTYL) 10 MG capsule TAKE ONE CAPSULE BY MOUTH TWICE A DAY 20 capsule 3  . hyoscyamine (LEVSIN SL) 0.125 MG SL tablet TAKE 1 TO 2 TABLETS BY MOUTH EVERY 6 HOURS AS NEEDED FOR CRAMPY ABDOMINAL PAIN 120 tablet 11  . losartan (COZAAR) 25 MG tablet TAKE ONE TABLET BY MOUTH DAILY 30 tablet 0  . pantoprazole (PROTONIX) 40 MG tablet TAKE ONE TABLET BY MOUTH DAILY - NEEDS OFFICE VISIT FOR FURTHER REFILLS 30 tablet 3  . propranolol ER (INDERAL LA) 80 MG 24 hr capsule TAKE ONE CAPSULE BY MOUTH EVERY NIGHT AT BEDTIME 30 capsule 2  . sertraline (ZOLOFT) 100 MG tablet TAKE 1 AND 1/2 TABLET BY MOUTH DAILY 45 tablet 0  . Vitamin D, Ergocalciferol, (DRISDOL) 1.25 MG (50000 UT) CAPS capsule Take 1 capsule (50,000 Units total) by mouth every 7 (seven) days. 4 capsule 4   No facility-administered medications prior to visit.     Allergies  Allergen Reactions  . Amoxicillin-Pot Clavulanate     REACTION: Throat swelling.  . Azithromycin     REACTION: Throat Swelling  . Cefazolin     REACTION: Throat Swelling  . Moxifloxacin     REACTION: Throat Swelling   . Penicillins     REACTION: throat swelling  . Sulfonamide Derivatives     Review of Systems  Constitutional: Negative for fever and malaise/fatigue.  HENT: Negative for congestion.   Eyes: Negative for blurred vision.  Respiratory: Negative for shortness of breath.   Cardiovascular: Negative for chest pain, palpitations and leg swelling.  Gastrointestinal: Negative  for abdominal pain, blood in stool and nausea.  Genitourinary: Negative for dysuria and frequency.  Musculoskeletal: Positive for myalgias. Negative for falls.  Skin: Negative for rash.  Neurological: Negative for dizziness, loss of consciousness and headaches.  Endo/Heme/Allergies: Negative for environmental allergies.  Psychiatric/Behavioral: Negative for depression. The patient is not nervous/anxious.        Objective:    Physical Exam Constitutional:      Appearance: She is not ill-appearing.  HENT:     Head: Normocephalic and atraumatic.     Nose: Nose normal.  Pulmonary:     Effort: Pulmonary effort is normal.  Neurological:     Mental Status: She is alert and oriented to person, place, and time.  Psychiatric:        Mood and Affect: Mood normal.        Behavior: Behavior normal.     BP 115/70 (BP Location: Left Arm, Patient Position: Sitting, Cuff Size: Normal)   Wt 197 lb (89.4 kg)   LMP 09/23/2011   BMI 31.32 kg/m  Wt Readings from Last 3 Encounters:  08/02/19 197 lb (89.4 kg)  02/01/19 197 lb (89.4 kg)  01/18/19 195 lb 4 oz (88.6 kg)    Diabetic Foot Exam - Simple   No data filed     Lab Results  Component Value Date   WBC 8.6 02/01/2019   HGB 13.9 02/01/2019   HCT 41.1 02/01/2019   PLT 305.0 02/01/2019   GLUCOSE 90 02/01/2019   CHOL 181 02/01/2019   TRIG 146.0 02/01/2019   HDL 64.70 02/01/2019   LDLDIRECT 92.0 10/28/2016   LDLCALC 87 02/01/2019   ALT 21 02/01/2019   AST 23 02/01/2019   NA 139 02/01/2019   K 4.4 02/01/2019   CL 103 02/01/2019   CREATININE 0.58 02/01/2019    BUN 12 02/01/2019   CO2 24 02/01/2019   TSH 3.22 02/01/2019   HGBA1C 5.5 02/01/2019    Lab Results  Component Value Date   TSH 3.22 02/01/2019   Lab Results  Component Value Date   WBC 8.6 02/01/2019   HGB 13.9 02/01/2019   HCT 41.1 02/01/2019   MCV 91.9 02/01/2019   PLT 305.0 02/01/2019   Lab Results  Component Value Date   NA 139 02/01/2019   K 4.4 02/01/2019   CO2 24 02/01/2019   GLUCOSE 90 02/01/2019   BUN 12 02/01/2019   CREATININE 0.58 02/01/2019   BILITOT 0.6 02/01/2019   ALKPHOS 94 02/01/2019   AST 23 02/01/2019   ALT 21 02/01/2019   PROT 7.5 02/01/2019   ALBUMIN 4.5 02/01/2019   CALCIUM 9.7 02/01/2019   GFR 106.45 02/01/2019   Lab Results  Component Value Date   CHOL 181 02/01/2019   Lab Results  Component Value Date   HDL 64.70 02/01/2019   Lab Results  Component Value Date   LDLCALC 87 02/01/2019   Lab Results  Component Value Date   TRIG 146.0 02/01/2019   Lab Results  Component Value Date   CHOLHDL 3 02/01/2019   Lab Results  Component Value Date   HGBA1C 5.5 02/01/2019       Assessment & Plan:   Problem List Items Addressed This Visit    Hypertension    Encouraged to check vitals weekly, no changes to meds. Encouraged heart healthy diet such as the DASH diet and exercise as tolerated.       Relevant Orders   CBC   Comprehensive metabolic panel   TSH  Hyperlipidemia    Tolerating statin, encouraged heart healthy diet, avoid trans fats, minimize simple carbs and saturated fats. Increase exercise as tolerated      Relevant Orders   Lipid panel   Vitamin D deficiency - Primary    Supplement and monitor      Relevant Orders   VITAMIN D 25 Hydroxy (Vit-D Deficiency, Fractures)   Hyperglycemia    hgba1c acceptable, minimize simple carbs. Increase exercise as tolerated.       Relevant Orders   Hemoglobin A1c      I am having [name] maintain her hyoscyamine, diclofenac sodium, Vitamin D (Ergocalciferol),  dicyclomine, sertraline, losartan, pantoprazole, cyclobenzaprine, propranolol ER, and atorvastatin.  No orders of the defined types were placed in this encounter.   I discussed the assessment and treatment plan with the patient. The patient was provided an opportunity to ask questions and all were answered. The patient agreed with the plan and demonstrated an understanding of the instructions.   The patient was advised to call back or seek an in-person evaluation if the symptoms worsen or if the condition fails to improve as anticipated.  I provided 25 minutes of non-face-to-face time during this encounter.   Penni Homans, MD

## 2019-08-03 NOTE — Assessment & Plan Note (Signed)
hgba1c acceptable, minimize simple carbs. Increase exercise as tolerated.  

## 2019-08-03 NOTE — Assessment & Plan Note (Signed)
Encouraged to check vitals weekly, no changes to meds. Encouraged heart healthy diet such as the DASH diet and exercise as tolerated.

## 2019-08-03 NOTE — Assessment & Plan Note (Signed)
Supplement and monitor 

## 2019-08-03 NOTE — Assessment & Plan Note (Signed)
Tolerating statin, encouraged heart healthy diet, avoid trans fats, minimize simple carbs and saturated fats. Increase exercise as tolerated 

## 2019-08-06 ENCOUNTER — Other Ambulatory Visit: Payer: Self-pay | Admitting: Family Medicine

## 2019-08-22 ENCOUNTER — Telehealth: Payer: Self-pay | Admitting: Internal Medicine

## 2019-08-22 NOTE — Telephone Encounter (Signed)
Pt just made appt for f/u with Dr. Hilarie Fredrickson for 10/7 but she is requesting refill for pantoprazole because she is running out and will not have med until she sees him. She uses [facility] on [address] in [place].

## 2019-08-23 NOTE — Telephone Encounter (Signed)
I have spoken to pharmacist at [facility] who indicates that patient picked up script for pantoprazole 7 days ago but also has a script on hold for pantoprazole. I advised patient to use script she currently has for pantoprazole and to then call [facility] and ask that they fill prescription on hold when she is about to run out of pantoprazole. She verbalizes understanding.

## 2019-08-29 ENCOUNTER — Other Ambulatory Visit: Payer: Self-pay | Admitting: Family Medicine

## 2019-09-08 ENCOUNTER — Other Ambulatory Visit (INDEPENDENT_AMBULATORY_CARE_PROVIDER_SITE_OTHER): Payer: 59

## 2019-09-08 ENCOUNTER — Other Ambulatory Visit: Payer: Self-pay

## 2019-09-08 ENCOUNTER — Ambulatory Visit (INDEPENDENT_AMBULATORY_CARE_PROVIDER_SITE_OTHER): Payer: 59 | Admitting: *Deleted

## 2019-09-08 DIAGNOSIS — I10 Essential (primary) hypertension: Secondary | ICD-10-CM

## 2019-09-08 DIAGNOSIS — E559 Vitamin D deficiency, unspecified: Secondary | ICD-10-CM

## 2019-09-08 DIAGNOSIS — R739 Hyperglycemia, unspecified: Secondary | ICD-10-CM | POA: Diagnosis not present

## 2019-09-08 DIAGNOSIS — E782 Mixed hyperlipidemia: Secondary | ICD-10-CM | POA: Diagnosis not present

## 2019-09-08 DIAGNOSIS — Z23 Encounter for immunization: Secondary | ICD-10-CM

## 2019-09-08 LAB — COMPREHENSIVE METABOLIC PANEL
ALT: 31 U/L (ref 0–35)
AST: 25 U/L (ref 0–37)
Albumin: 4.3 g/dL (ref 3.5–5.2)
Alkaline Phosphatase: 89 U/L (ref 39–117)
BUN: 12 mg/dL (ref 6–23)
CO2: 28 mEq/L (ref 19–32)
Calcium: 9.6 mg/dL (ref 8.4–10.5)
Chloride: 103 mEq/L (ref 96–112)
Creatinine, Ser: 0.63 mg/dL (ref 0.40–1.20)
GFR: 96.57 mL/min (ref >60.00)
Glucose, Bld: 102 mg/dL — ABNORMAL HIGH (ref 70–99)
Potassium: 4.7 mEq/L (ref 3.5–5.1)
Sodium: 138 mEq/L (ref 135–145)
Total Bilirubin: 0.6 mg/dL (ref 0.2–1.2)
Total Protein: 7.3 g/dL (ref 6.0–8.3)

## 2019-09-08 LAB — CBC
HCT: 41.7 % (ref 36.0–46.0)
Hemoglobin: 14.2 g/dL (ref 12.0–15.0)
MCHC: 34 g/dL (ref 30.0–36.0)
MCV: 92.4 fl (ref 78.0–100.0)
Platelets: 275 10*3/uL (ref 150.0–400.0)
RBC: 4.51 Mil/uL (ref 3.87–5.11)
RDW: 12.4 % (ref 11.5–15.5)
WBC: 6.8 10*3/uL (ref 4.0–10.5)

## 2019-09-08 LAB — LIPID PANEL
Cholesterol: 204 mg/dL — ABNORMAL HIGH (ref 0–200)
HDL: 61.3 mg/dL (ref >39.00)
NonHDL: 143.15
Total CHOL/HDL Ratio: 3
Triglycerides: 273 mg/dL — ABNORMAL HIGH (ref 0.0–149.0)
VLDL: 54.6 mg/dL — ABNORMAL HIGH (ref 0.0–40.0)

## 2019-09-08 LAB — HEMOGLOBIN A1C: Hgb A1c MFr Bld: 5.7 % (ref 4.6–6.5)

## 2019-09-08 LAB — TSH: TSH: 6.72 u[IU]/mL — ABNORMAL HIGH (ref 0.35–4.50)

## 2019-09-08 LAB — LDL CHOLESTEROL, DIRECT: Direct LDL: 117 mg/dL

## 2019-09-08 LAB — VITAMIN D 25 HYDROXY (VIT D DEFICIENCY, FRACTURES): VITD: 40.08 ng/mL (ref 30.00–100.00)

## 2019-09-08 NOTE — Progress Notes (Signed)
Patient here today for flu shot.  Vaccine given and patient tolerated well.

## 2019-09-09 ENCOUNTER — Encounter: Payer: Self-pay | Admitting: Family Medicine

## 2019-09-09 ENCOUNTER — Other Ambulatory Visit (INDEPENDENT_AMBULATORY_CARE_PROVIDER_SITE_OTHER): Payer: 59

## 2019-09-09 DIAGNOSIS — R7989 Other specified abnormal findings of blood chemistry: Secondary | ICD-10-CM | POA: Diagnosis not present

## 2019-09-09 LAB — T4, FREE: Free T4: 0.77 ng/dL (ref 0.60–1.60)

## 2019-09-14 ENCOUNTER — Other Ambulatory Visit: Payer: Self-pay | Admitting: Internal Medicine

## 2019-09-15 ENCOUNTER — Telehealth: Payer: Self-pay | Admitting: Family Medicine

## 2019-09-15 ENCOUNTER — Other Ambulatory Visit: Payer: Self-pay | Admitting: Family Medicine

## 2019-09-15 MED ORDER — LEVOTHYROXINE SODIUM 25 MCG PO TABS: 25.0000 ug | ORAL_TABLET | Freq: Every day | ORAL | 3 refills | Status: DC

## 2019-09-15 NOTE — Telephone Encounter (Signed)
I sent her levothyroxine 25 mcg daily to [facility], recheck labs in 3 months

## 2019-09-15 NOTE — Telephone Encounter (Signed)
Pt is calling and would like new rx levothyroxine to be sent to [address]

## 2019-09-15 NOTE — Telephone Encounter (Signed)
Medication not on current list, please advise.

## 2019-09-16 NOTE — Telephone Encounter (Signed)
Patient notified

## 2019-09-24 ENCOUNTER — Other Ambulatory Visit: Payer: Self-pay | Admitting: Family Medicine

## 2019-09-28 ENCOUNTER — Ambulatory Visit: Payer: 59 | Admitting: Internal Medicine

## 2019-09-28 ENCOUNTER — Encounter: Payer: Self-pay | Admitting: Internal Medicine

## 2019-09-28 ENCOUNTER — Other Ambulatory Visit: Payer: Self-pay

## 2019-09-28 VITALS — BP 128/74 | HR 80 | Temp 98.1°F | Ht 67.0 in | Wt 198.4 lb

## 2019-09-28 DIAGNOSIS — K625 Hemorrhage of anus and rectum: Secondary | ICD-10-CM | POA: Diagnosis not present

## 2019-09-28 DIAGNOSIS — K582 Mixed irritable bowel syndrome: Secondary | ICD-10-CM | POA: Diagnosis not present

## 2019-09-28 DIAGNOSIS — Z8371 Family history of colonic polyps: Secondary | ICD-10-CM | POA: Diagnosis not present

## 2019-09-28 DIAGNOSIS — K219 Gastro-esophageal reflux disease without esophagitis: Secondary | ICD-10-CM | POA: Diagnosis not present

## 2019-09-28 MED ORDER — DICYCLOMINE HCL 10 MG PO CAPS: 10.0000 mg | ORAL_CAPSULE | Freq: Four times a day (QID) | ORAL | 2 refills | Status: DC | PRN

## 2019-09-28 MED ORDER — SUPREP BOWEL PREP KIT 17.5-3.13-1.6 GM/177ML PO SOLN: 1.0000 | ORAL | 0 refills | Status: DC

## 2019-09-28 MED ORDER — PANTOPRAZOLE SODIUM 40 MG PO TBEC: DELAYED_RELEASE_TABLET | ORAL | 5 refills | Status: DC

## 2019-09-28 NOTE — Patient Instructions (Signed)
You have been scheduled for a colonoscopy. Please follow written instructions given to you at your visit today.  Please pick up your prep supplies at the pharmacy within the next 1-3 days. If you use inhalers (even only as needed), please bring them with you on the day of your procedure. Your physician has requested that you go to www.startemmi.com and enter the access code given to you at your visit today. This web site gives a general overview about your procedure. However, you should still follow specific instructions given to you by our office regarding your preparation for the procedure.  We have sent the following medications to your pharmacy for you to pick up at your convenience: Bentyl 10 mg 1-2 tablets every 6 hours as needed Pantoprazole 40 mg   If you are age 19 or older, your body mass index should be between 23-30. Your Body mass index is 31.07 kg/m. If this is out of the aforementioned range listed, please consider follow up with your Primary Care Provider.  If you are age 59 or younger, your body mass index should be between 19-25. Your Body mass index is 31.07 kg/m. If this is out of the aformentioned range listed, please consider follow up with your Primary Care Provider.

## 2019-09-28 NOTE — Progress Notes (Signed)
Subjective:    Patient ID: Kathy Huerta, female    DOB: 01-16-1961, 58 y.o.   MRN: [id number]  HPI Kathy Huerta is a 58 year old female with a history of GERD, hyperplastic colon polyps, IBS with alternating diarrhea and constipation, Sjogren's syndrome, hypertension and hyperlipidemia who is here for follow-up.  She was last seen by me in May 2019 but by Ellouise Newer, PA-C in January 2020.  She is here alone today.  She reports that most recently she has been doing well.  She has had intermittent episodes of painless rectal bleeding.  At times this is been fairly high-volume and included clots.  It seems to start and stop very fast lasting less than 24 hours.  At times it has been enough where she is needed to wear a feminine napkin.  Not always associated with bowel movement.  Has occurred 3 times since being seen in January.  Again always painless without abdominal pain.  Outside of these episodes she does have back-and-forth irritable bowel with diarrhea and at times constipation.  Benefiber seems to help this.  She occasionally uses MiraLAX but occasionally MiraLAX makes her stools too loose.  She does get significant benefit when she uses Bentyl she requests possibly to have this more frequently.  Her IBS does not seem to correspond to the bleeding episodes that she has described.   Separately she reports good control of reflux on pantoprazole.  No dysphagia or odynophagia  It should be noted today that her father had a previous partial colon resection for a large precancerous polyp.  He did not have colon cancer. Her last colonoscopy was 2013    Review of Systems As per HPI, otherwise negative  Current Medications, Allergies, Past Medical History, Past Surgical History, Family History and Social History were reviewed in [employer] record.     Objective:   Physical Exam BP 128/74 (BP Location: Right Arm, Patient Position: Sitting, Cuff Size: Normal)   Pulse 80    Temp 98.1 F (36.7 C) (Oral)   Ht 5\' 7"  (1.702 m)   Wt 198 lb 6 oz (90 kg)   LMP 09/23/2011   BMI 31.07 kg/m  Gen: awake, alert, NAD HEENT: anicteric, op clear CV: RRR, no mrg Pulm: CTA b/l Abd: soft, NT/ND, +BS throughout Ext: no c/c/e Neuro: nonfocal  CBC    Component Value Date/Time   WBC 6.8 09/08/2019 1011   RBC 4.51 09/08/2019 1011   HGB 14.2 09/08/2019 1011   HCT 41.7 09/08/2019 1011   PLT 275.0 09/08/2019 1011   MCV 92.4 09/08/2019 1011   MCH 30.9 04/18/2016 1602   MCHC 34.0 09/08/2019 1011   RDW 12.4 09/08/2019 1011   LYMPHSABS 2.0 02/03/2014 1442   MONOABS 0.5 02/03/2014 1442   EOSABS 0.1 02/03/2014 1442   BASOSABS 0.0 02/03/2014 1442   CMP     Component Value Date/Time   NA 138 09/08/2019 1011   K 4.7 09/08/2019 1011   CL 103 09/08/2019 1011   CO2 28 09/08/2019 1011   GLUCOSE 102 (H) 09/08/2019 1011   BUN 12 09/08/2019 1011   CREATININE 0.63 09/08/2019 1011   CREATININE 0.69 04/18/2016 1602   CALCIUM 9.6 09/08/2019 1011   PROT 7.3 09/08/2019 1011   ALBUMIN 4.3 09/08/2019 1011   AST 25 09/08/2019 1011   ALT 31 09/08/2019 1011   ALKPHOS 89 09/08/2019 1011   BILITOT 0.6 09/08/2019 1011   GFRNONAA >90 02/28/2013 0413   GFRNONAA >60 04/24/2011 0902  GFRAA >90 02/28/2013 0413   GFRAA >60 04/24/2011 0902        Assessment & Plan:  58 year old female with a history of GERD, hyperplastic colon polyps, IBS with alternating diarrhea and constipation, Sjogren's syndrome, hypertension and hyperlipidemia who is here for follow-up.    1.  Episodic rectal bleeding --sounds hemorrhoidal but without a bowel movement first this warrants further investigation.  It certainly could be diverticular in nature.  I recommended we proceed with colonoscopy which is also needed for screening based on her father's history of an advanced colon polyp.  We discussed the risk, benefits and alternatives and she is agreeable and wishes to proceed  2.  Family history of  advanced colon polyp --5-year surveillance interval indicated.  She is due at this time.  We will proceed with colonoscopy  3.  IBS with alternating diarrhea and constipation --good response to fiber and Bentyl.  Certainly okay to increase Bentyl dose --Continue daily fiber --Continue as needed MiraLAX --Bentyl 10 mg 1 to 2 tablets every 6 hours as needed for crampy abdominal pain  4.  GERD --currently well controlled pantoprazole.  Continue 40 mg daily  25 minutes spent with the patient today. Greater than 50% was spent in counseling and coordination of care with the patient

## 2019-10-03 ENCOUNTER — Other Ambulatory Visit: Payer: Self-pay | Admitting: Family Medicine

## 2019-10-23 ENCOUNTER — Other Ambulatory Visit: Payer: Self-pay | Admitting: Family Medicine

## 2019-10-26 ENCOUNTER — Encounter: Payer: Self-pay | Admitting: Internal Medicine

## 2019-10-28 ENCOUNTER — Other Ambulatory Visit: Payer: Self-pay

## 2019-10-28 ENCOUNTER — Telehealth: Payer: Self-pay | Admitting: Internal Medicine

## 2019-10-28 ENCOUNTER — Ambulatory Visit (AMBULATORY_SURGERY_CENTER): Payer: 59 | Admitting: Internal Medicine

## 2019-10-28 ENCOUNTER — Encounter: Payer: Self-pay | Admitting: Internal Medicine

## 2019-10-28 VITALS — BP 115/81 | HR 72 | Temp 97.8°F | Resp 13 | Ht 67.0 in | Wt 198.0 lb

## 2019-10-28 DIAGNOSIS — Z8371 Family history of colonic polyps: Secondary | ICD-10-CM

## 2019-10-28 DIAGNOSIS — Z1211 Encounter for screening for malignant neoplasm of colon: Secondary | ICD-10-CM | POA: Diagnosis not present

## 2019-10-28 DIAGNOSIS — D124 Benign neoplasm of descending colon: Secondary | ICD-10-CM | POA: Diagnosis not present

## 2019-10-28 DIAGNOSIS — K635 Polyp of colon: Secondary | ICD-10-CM

## 2019-10-28 DIAGNOSIS — D120 Benign neoplasm of cecum: Secondary | ICD-10-CM

## 2019-10-28 DIAGNOSIS — D122 Benign neoplasm of ascending colon: Secondary | ICD-10-CM | POA: Diagnosis not present

## 2019-10-28 DIAGNOSIS — D125 Benign neoplasm of sigmoid colon: Secondary | ICD-10-CM

## 2019-10-28 MED ORDER — SODIUM CHLORIDE 0.9 % IV SOLN: 500.0000 mL | Freq: Once | INTRAVENOUS | Status: DC

## 2019-10-28 NOTE — Op Note (Signed)
[place] Patient Name: Kathy Huerta Procedure Date: 10/28/2019 12:05 PM MRN: [id number] Endoscopist: Jerene Bears , MD Age: 58 Referring MD:  Date of Birth: Oct 10, 1961 Gender: Female Account #: [id number] Procedure:                Colonoscopy Indications:              Colon cancer screening in patient with 1st-degree                            relative having advanced adenoma of the colon, Last                            colonoscopy: 2013 Medicines:                Monitored Anesthesia Care Procedure:                Pre-Anesthesia Assessment:                           - Prior to the procedure, a History and Physical                            was performed, and patient medications and                            allergies were reviewed. The patient's tolerance of                            previous anesthesia was also reviewed. The risks                            and benefits of the procedure and the sedation                            options and risks were discussed with the patient.                            All questions were answered, and informed consent                            was obtained. Prior Anticoagulants: The patient has                            taken no previous anticoagulant or antiplatelet                            agents. ASA Grade Assessment: II - A patient with                            mild systemic disease. After reviewing the risks                            and benefits, the patient was deemed in  satisfactory condition to undergo the procedure.                           After obtaining informed consent, the colonoscope                            was passed under direct vision. Throughout the                            procedure, the patient's blood pressure, pulse, and                            oxygen saturations were monitored continuously. The                            Colonoscope was introduced through the anus  and                            advanced to the cecum, identified by appendiceal                            orifice and ileocecal valve. The colonoscopy was                            performed without difficulty. The patient tolerated                            the procedure well. The quality of the bowel                            preparation was good. The ileocecal valve,                            appendiceal orifice, and rectum were photographed. Scope In: 12:13:09 PM Scope Out: [id number] PM Scope Withdrawal Time: 0 hours 19 minutes 21 seconds  Total Procedure Duration: 0 hours 23 minutes 8 seconds  Findings:                 Hemorrhoids were found on perianal exam.                           A 6 mm polyp was found in the cecum. The polyp was                            sessile. The polyp was removed with a cold snare.                            Resection and retrieval were complete.                           A 6 mm polyp was found in the ascending colon. The                            polyp was flat.  The polyp was removed with a cold                            snare. Resection and retrieval were complete.                           Two sessile polyps were found in the descending                            colon. The polyps were 4 to 7 mm in size. These                            polyps were removed with a cold snare. Resection                            and retrieval were complete.                           A 5 mm polyp was found in the sigmoid colon. The                            polyp was sessile. The polyp was removed with a                            cold snare. Resection and retrieval were complete.                           Internal hemorrhoids were found during retroflexion                            and during digital exam. The hemorrhoids were small. Complications:            No immediate complications. Estimated Blood Loss:     Estimated blood loss was minimal. Impression:                - Hemorrhoids found on perianal exam.                           - One 6 mm polyp in the cecum, removed with a cold                            snare. Resected and retrieved.                           - One 6 mm polyp in the ascending colon, removed                            with a cold snare. Resected and retrieved.                           - Two 4 to 7 mm polyps in the descending colon,  removed with a cold snare. Resected and retrieved.                           - One 5 mm polyp in the sigmoid colon, removed with                            a cold snare. Resected and retrieved.                           - Internal hemorrhoids. Recommendation:           - Patient has a contact number available for                            emergencies. The signs and symptoms of potential                            delayed complications were discussed with the                            patient. Return to normal activities tomorrow.                            Written discharge instructions were provided to the                            patient.                           - Resume previous diet.                           - Continue present medications.                           - Await pathology results.                           - If rectal bleeding secondary to internal                            hemorrhoids persists, hemorrhoidal banding can be                            performed for treatment.                           - Repeat colonoscopy is recommended for                            surveillance. The colonoscopy date will be                            determined after pathology results from today's                            exam  become available for review. Jerene Bears, MD 10/28/2019 12:40:44 PM This report has been signed electronically.

## 2019-10-28 NOTE — Progress Notes (Signed)
Called to room to assist during endoscopic procedure.  Patient ID and intended procedure confirmed with present staff. Received instructions for my participation in the procedure from the performing physician.  

## 2019-10-28 NOTE — Progress Notes (Signed)
Temp taken by LC VS taken by CW 

## 2019-10-28 NOTE — Telephone Encounter (Signed)
Patient had some bleeding overnight after first dose of prep - similar to bleeding she is having already  Advised to take the second half of prep

## 2019-10-28 NOTE — Patient Instructions (Signed)
5 polyps and internal hemorrhoids.  The polyps were removed.    YOU HAD AN ENDOSCOPIC PROCEDURE TODAY AT [facility]:   Refer to the procedure report that was given to you for any specific questions about what was found during the examination.  If the procedure report does not answer your questions, please call your gastroenterologist to clarify.  If you requested that your care partner not be given the details of your procedure findings, then the procedure report has been included in a sealed envelope for you to review at your convenience later.  YOU SHOULD EXPECT: Some feelings of bloating in the abdomen. Passage of more gas than usual.  Walking can help get rid of the air that was put into your GI tract during the procedure and reduce the bloating. If you had a lower endoscopy (such as a colonoscopy or flexible sigmoidoscopy) you may notice spotting of blood in your stool or on the toilet paper. If you underwent a bowel prep for your procedure, you may not have a normal bowel movement for a few days.  Please Note:  You might notice some irritation and congestion in your nose or some drainage.  This is from the oxygen used during your procedure.  There is no need for concern and it should clear up in a day or so.  SYMPTOMS TO REPORT IMMEDIATELY:   Following lower endoscopy (colonoscopy or flexible sigmoidoscopy):  Excessive amounts of blood in the stool  Significant tenderness or worsening of abdominal pains  Swelling of the abdomen that is new, acute  Fever of 100F or higher  For urgent or emergent issues, a gastroenterologist can be reached at any hour by calling [phone].   DIET:  We do recommend a small meal at first, but then you may proceed to your regular diet.  Drink plenty of fluids but you should avoid alcoholic beverages for 24 hours.  ACTIVITY:  You should plan to take it easy for the rest of today and you should NOT DRIVE or use heavy machinery until  tomorrow (because of the sedation medicines used during the test).    FOLLOW UP: Our staff will call the number listed on your records 48-72 hours following your procedure to check on you and address any questions or concerns that you may have regarding the information given to you following your procedure. If we do not reach you, we will leave a message.  We will attempt to reach you two times.  During this call, we will ask if you have developed any symptoms of COVID 19. If you develop any symptoms (ie: fever, flu-like symptoms, shortness of breath, cough etc.) before then, please call [phone].  If you test positive for Covid 19 in the 2 weeks post procedure, please call and report this information to [place].    If any biopsies were taken you will be contacted by phone or by letter within the next 1-3 weeks.  Please call us at [phone] if you have not heard about the biopsies in 3 weeks.    SIGNATURES/CONFIDENTIALITY: You and/or your care partner have signed paperwork which will be entered into your electronic medical record.  These signatures attest to the fact that that the information above on your After Visit Summary has been reviewed and is understood.  Full responsibility of the confidentiality of this discharge information lies with you and/or your care-partner.

## 2019-10-28 NOTE — Progress Notes (Signed)
Pt tolerated well. VSS. Maren Reamer and to recovery.

## 2019-11-01 ENCOUNTER — Telehealth: Payer: Self-pay

## 2019-11-01 NOTE — Telephone Encounter (Signed)
  Follow up Call-  Call back number 10/28/2019  Post procedure Call Back phone  # [phone]  Permission to leave phone message Yes  Some recent data might be hidden     Patient questions:  Do you have a fever, pain , or abdominal swelling? No. Pain Score  0 *  Have you tolerated food without any problems? Yes.    Have you been able to return to your normal activities? Yes.    Do you have any questions about your discharge instructions: Diet   No. Medications  No. Follow up visit  No.  Do you have questions or concerns about your Care? No.  Actions: * If pain score is 4 or above: No action needed, pain <4.  1. Have you developed a fever since your procedure? no  2.   Have you had an respiratory symptoms (SOB or cough) since your procedure? no  3.   Have you tested positive for COVID 19 since your procedure? no  4.   Have you had any family members/close contacts diagnosed with the COVID 19 since your procedure?  no   If yes to any of these questions please route to Joylene John, RN and Alphonsa Gin, Therapist, sports.

## 2019-11-06 ENCOUNTER — Other Ambulatory Visit: Payer: Self-pay | Admitting: Family Medicine

## 2019-11-07 ENCOUNTER — Encounter: Payer: Self-pay | Admitting: Internal Medicine

## 2019-11-23 ENCOUNTER — Other Ambulatory Visit: Payer: Self-pay | Admitting: Family Medicine

## 2019-12-05 ENCOUNTER — Other Ambulatory Visit: Payer: Self-pay | Admitting: Family Medicine

## 2019-12-09 ENCOUNTER — Encounter: Payer: Self-pay | Admitting: Family Medicine

## 2019-12-13 ENCOUNTER — Other Ambulatory Visit: Payer: Self-pay | Admitting: Family Medicine

## 2019-12-13 MED ORDER — PROPRANOLOL HCL 40 MG PO TABS: 40.0000 mg | ORAL_TABLET | Freq: Two times a day (BID) | ORAL | 1 refills | Status: DC

## 2019-12-15 NOTE — Telephone Encounter (Signed)
Per Dr.blyth: sent in a new prescription for her but she needs a follow up in about a month to assess her bp and response to new med please arrange

## 2019-12-15 NOTE — Telephone Encounter (Signed)
Yes thanks 

## 2019-12-18 ENCOUNTER — Other Ambulatory Visit: Payer: Self-pay | Admitting: Family Medicine

## 2019-12-24 ENCOUNTER — Other Ambulatory Visit: Payer: Self-pay | Admitting: Family Medicine

## 2019-12-26 ENCOUNTER — Other Ambulatory Visit: Payer: 59

## 2019-12-27 ENCOUNTER — Encounter: Payer: Self-pay | Admitting: Family Medicine

## 2019-12-29 ENCOUNTER — Ambulatory Visit: Payer: 59 | Attending: Family

## 2019-12-29 ENCOUNTER — Ambulatory Visit (INDEPENDENT_AMBULATORY_CARE_PROVIDER_SITE_OTHER): Payer: 59 | Admitting: Family Medicine

## 2019-12-29 ENCOUNTER — Other Ambulatory Visit: Payer: Self-pay

## 2019-12-29 DIAGNOSIS — J069 Acute upper respiratory infection, unspecified: Secondary | ICD-10-CM | POA: Diagnosis not present

## 2019-12-29 DIAGNOSIS — E782 Mixed hyperlipidemia: Secondary | ICD-10-CM

## 2019-12-29 DIAGNOSIS — I10 Essential (primary) hypertension: Secondary | ICD-10-CM

## 2019-12-29 DIAGNOSIS — R739 Hyperglycemia, unspecified: Secondary | ICD-10-CM

## 2019-12-29 DIAGNOSIS — Z20822 Contact with and (suspected) exposure to covid-19: Secondary | ICD-10-CM

## 2019-12-29 DIAGNOSIS — E039 Hypothyroidism, unspecified: Secondary | ICD-10-CM

## 2019-12-29 NOTE — Assessment & Plan Note (Signed)
Tolerating statin, encouraged heart healthy diet, avoid trans fats, minimize simple carbs and saturated fats. Increase exercise as tolerated 

## 2019-12-29 NOTE — Assessment & Plan Note (Signed)
hgba1c acceptable, minimize simple carbs. Increase exercise as tolerated.  

## 2019-12-29 NOTE — Assessment & Plan Note (Addendum)
Encouraged to monitor, no changes to meds. Encouraged heart healthy diet such as the DASH diet and exercise as tolerated. Tolerating Propranolol 40 mg po bid she should report any concerning numbers.

## 2019-12-29 NOTE — Assessment & Plan Note (Signed)
Cough, flushing, myyalgias, sore throat congestion, malaise. She agrees to call for covid testing and isolate. Encouraged MV with minerals, ASA 81 mg bid, pepcid 20 mg bid, vit D 2000 Iu daily probiotics, Melatonin, Vitamin C bid and Zinc. Encouraged increased rest and hydration, add probiotics, zinc such as Coldeze or Xicam. Treat fevers as needed

## 2019-12-29 NOTE — Progress Notes (Signed)
Patient ID: Kathy Huerta, female   DOB: [birth year], 60 y.o.   MRN: [id number] Virtual Visit via phone Note  I connected with Kathy Huerta on 12/29/19 at  8:20 AM EST by a phone enabled telemedicine application and verified that I am speaking with the correct person using two identifiers.  Location: Patient: home Provider: office   I discussed the limitations of evaluation and management by telemedicine and the availability of in person appointments. The patient expressed understanding and agreed to proceed. Magdalene Molly, CMA was able to set the patient up with a phone visit after being unable to set up a video visit.    Subjective:    Patient ID: Kathy Huerta, female    DOB: Jul 10, 1961, 59 y.o.   MRN: [id number]  No chief complaint on file.   HPI Patient is in today for follow up on chronic medical concerns including hypothyroidism, hypertension and more. No recent febrile illness or hosptializations. She is noting 5 days of head congestion rhinorrhea, sore throat, nausea, malaise, myalgias and more. No vomiting or chest pain. Denies CP/palp/SOB/HA or GU c/o. Taking meds as prescribed  Past Medical History:  Diagnosis Date  . Anxiety   . Arthritis of right hip 10/28/2016  . Basal cell carcinoma 11/17/11   Dr. Sammuel Hines  . Cutaneous skin tags 07/24/2013  . DDD (degenerative disc disease), cervical    s/p fusion  . DDD (degenerative disc disease), thoracolumbar    on disability  . Depression   . Headache(784.0) 01/29/2013  . Hearing loss    Saw ENT on 04/27/2017; Moderate hearing loss in both ears.  . Heartburn   . Hiatal hernia   . HTN (hypertension) 2002  . Hyperlipemia 08/2014  . Hyperplastic colon polyp   . Hypothyroid   . Internal hemorrhoids   . Obesity   . Obesity 04/30/2017  . Pain in joint, shoulder region 11/20/2013  . Pancreatitis 04/24/2011  . Pneumonia 07/24/2013  . Preventative health care 10/28/2016  . Tachycardia 08/12/2014  . Vitamin D deficiency 11/18/2015     Past Surgical History:  Procedure Laterality Date  . ANTERIOR FUSION CERVICAL SPINE  2008   C6-7  Dr Saintclair Halsted  . CHOLECYSTECTOMY N/A 02/27/2013   Procedure: LAPAROSCOPIC CHOLECYSTECTOMY WITH INTRAOPERATIVE CHOLANGIOGRAM;  Surgeon: Adin Hector, MD;  Location: [facility];  Service: General;  Laterality: N/A;  . POSTERIOR FUSION CERVICAL SPINE  2011   C6-7    Family History  Problem Relation Age of Onset  . Hypertension Mother   . Hyperlipidemia Mother   . Other Father        DDD, colectomy,cardiac stent, afib  . Colon polyps Father   . Hypertension Brother   . Diabetes Maternal Grandmother   . Colon cancer Neg Hx   . Stomach cancer Neg Hx   . Kidney disease Neg Hx     Social History   Socioeconomic History  . Marital status: Married    Spouse name: Not on file  . Number of children: 0  . Years of education: Not on file  . Highest education level: Not on file  Occupational History  . Occupation: Disabled    Employer: UNEMPLOYED  Tobacco Use  . Smoking status: Never Smoker  . Smokeless tobacco: Never Used  Substance and Sexual Activity  . Alcohol use: Yes    Alcohol/week: 5.0 standard drinks    Types: 5 Cans of beer per week  . Drug use: No  . Sexual activity: Not on file  Other Topics Concern  . Not on file  Social History Narrative   Reviewed history and no changes required.   Occupation: Disabled,  has worked in the past for a [employer] (medical))   Married   Alcohol use-yes- every weekend 4-5 beers.   Regular exercise-no   Smoking Status:  quit   Caffeine use/day:  none   Does Patient Exercise:  no         Social Determinants of Health   Financial Resource Strain:   . Difficulty of Paying Living Expenses: Not on file  Food Insecurity:   . Worried About Charity fundraiser in the Last Year: Not on file  . Ran Out of Food in the Last Year: Not on file  Transportation Needs:   . Lack of Transportation (Medical): Not on file  . Lack of  Transportation (Non-Medical): Not on file  Physical Activity:   . Days of Exercise per Week: Not on file  . Minutes of Exercise per Session: Not on file  Stress:   . Feeling of Stress : Not on file  Social Connections:   . Frequency of Communication with Friends and Family: Not on file  . Frequency of Social Gatherings with Friends and Family: Not on file  . Attends Religious Services: Not on file  . Active Member of Clubs or Organizations: Not on file  . Attends Archivist Meetings: Not on file  . Marital Status: Not on file  Intimate Partner Violence:   . Fear of Current or Ex-Partner: Not on file  . Emotionally Abused: Not on file  . Physically Abused: Not on file  . Sexually Abused: Not on file    Outpatient Medications Prior to Visit  Medication Sig Dispense Refill  . atorvastatin (LIPITOR) 40 MG tablet TAKE ONE TABLET BY MOUTH DAILY 30 tablet 1  . cyclobenzaprine (FLEXERIL) 10 MG tablet TAKE ONE TABLET BY MOUTH TWICE A DAY AS NEEDED FOR MUSCLE SPASMS 60 tablet 0  . diclofenac sodium (VOLTAREN) 1 % GEL Apply 4 g topically 4 (four) times daily as needed. 300 g 5  . dicyclomine (BENTYL) 10 MG capsule Take 1-2 capsules (10-20 mg total) by mouth every 6 (six) hours as needed for spasms. 120 capsule 2  . hyoscyamine (LEVSIN SL) 0.125 MG SL tablet TAKE 1 TO 2 TABLETS BY MOUTH EVERY 6 HOURS AS NEEDED FOR CRAMPY ABDOMINAL PAIN 120 tablet 11  . levothyroxine (SYNTHROID) 25 MCG tablet Take 1 tablet (25 mcg total) by mouth daily before breakfast. 30 tablet 3  . losartan (COZAAR) 25 MG tablet TAKE ONE TABLET BY MOUTH DAILY 30 tablet 0  . pantoprazole (PROTONIX) 40 MG tablet TAKE ONE TABLET BY MOUTH DAILY 30 tablet 5  . propranolol (INDERAL) 40 MG tablet Take 1 tablet (40 mg total) by mouth 2 (two) times daily. 60 tablet 1  . sertraline (ZOLOFT) 100 MG tablet TAKE 1 AND 1/2 TABLET BY MOUTH DAILY 45 tablet 0  . Vitamin D, Ergocalciferol, (DRISDOL) 1.25 MG (50000 UT) CAPS capsule TAKE  ONE CAPSULE BY MOUTH EVERY 7 DAYS 4 capsule 3   No facility-administered medications prior to visit.    Allergies  Allergen Reactions  . Amoxicillin-Pot Clavulanate     REACTION: Throat swelling.  . Azithromycin     REACTION: Throat Swelling  . Cefazolin     REACTION: Throat Swelling  . Moxifloxacin     REACTION: Throat Swelling  . Penicillins     REACTION: throat swelling  .  Sulfonamide Derivatives     Review of Systems  Constitutional: Positive for fever and malaise/fatigue. Negative for chills.  HENT: Positive for congestion and sore throat.   Eyes: Negative for blurred vision.  Respiratory: Positive for cough and sputum production. Negative for shortness of breath.   Cardiovascular: Negative for chest pain, palpitations, leg swelling and PND.  Gastrointestinal: Positive for nausea. Negative for abdominal pain, blood in stool and vomiting.  Genitourinary: Negative for dysuria and frequency.  Musculoskeletal: Positive for myalgias. Negative for falls.  Skin: Negative for rash.  Neurological: Negative for dizziness, loss of consciousness and headaches.  Endo/Heme/Allergies: Negative for environmental allergies.  Psychiatric/Behavioral: Negative for depression. The patient is not nervous/anxious.        Objective:    Physical Exam unable to obtain via phone  BP 101/79 (BP Location: Left Arm, Patient Position: Sitting, Cuff Size: Normal)   Wt 197 lb (89.4 kg)   LMP 09/23/2011   BMI 30.85 kg/m  Wt Readings from Last 3 Encounters:  12/29/19 197 lb (89.4 kg)  10/28/19 198 lb (89.8 kg)  09/28/19 198 lb 6 oz (90 kg)    Diabetic Foot Exam - Simple   No data filed     Lab Results  Component Value Date   WBC 6.8 09/08/2019   HGB 14.2 09/08/2019   HCT 41.7 09/08/2019   PLT 275.0 09/08/2019   GLUCOSE 102 (H) 09/08/2019   CHOL 204 (H) 09/08/2019   TRIG 273.0 (H) 09/08/2019   HDL 61.30 09/08/2019   LDLDIRECT 117.0 09/08/2019   LDLCALC 87 02/01/2019   ALT 31  09/08/2019   AST 25 09/08/2019   NA 138 09/08/2019   K 4.7 09/08/2019   CL 103 09/08/2019   CREATININE 0.63 09/08/2019   BUN 12 09/08/2019   CO2 28 09/08/2019   TSH 6.72 (H) 09/08/2019   HGBA1C 5.7 09/08/2019    Lab Results  Component Value Date   TSH 6.72 (H) 09/08/2019   Lab Results  Component Value Date   WBC 6.8 09/08/2019   HGB 14.2 09/08/2019   HCT 41.7 09/08/2019   MCV 92.4 09/08/2019   PLT 275.0 09/08/2019   Lab Results  Component Value Date   NA 138 09/08/2019   K 4.7 09/08/2019   CO2 28 09/08/2019   GLUCOSE 102 (H) 09/08/2019   BUN 12 09/08/2019   CREATININE 0.63 09/08/2019   BILITOT 0.6 09/08/2019   ALKPHOS 89 09/08/2019   AST 25 09/08/2019   ALT 31 09/08/2019   PROT 7.3 09/08/2019   ALBUMIN 4.3 09/08/2019   CALCIUM 9.6 09/08/2019   GFR 96.57 09/08/2019   Lab Results  Component Value Date   CHOL 204 (H) 09/08/2019   Lab Results  Component Value Date   HDL 61.30 09/08/2019   Lab Results  Component Value Date   LDLCALC 87 02/01/2019   Lab Results  Component Value Date   TRIG 273.0 (H) 09/08/2019   Lab Results  Component Value Date   CHOLHDL 3 09/08/2019   Lab Results  Component Value Date   HGBA1C 5.7 09/08/2019       Assessment & Plan:   Problem List Items Addressed This Visit    Hypertension    Encouraged to monitor, no changes to meds. Encouraged heart healthy diet such as the DASH diet and exercise as tolerated. Tolerating Propranolol 40 mg po bid she should report any concerning numbers.       Hyperlipidemia    Tolerating statin, encouraged heart healthy diet,  avoid trans fats, minimize simple carbs and saturated fats. Increase exercise as tolerated      URI (upper respiratory infection)    Cough, flushing, myyalgias, sore throat congestion, malaise. She agrees to call for covid testing and isolate. Encouraged MV with minerals, ASA 81 mg bid, pepcid 20 mg bid, vit D 2000 Iu daily probiotics, Melatonin, Vitamin C bid and  Zinc. Encouraged increased rest and hydration, add probiotics, zinc such as Coldeze or Xicam. Treat fevers as needed      Hyperglycemia    hgba1c acceptable, minimize simple carbs. Increase exercise as tolerated.       Hypothyroidism    She is tolerating the Levothyroxine and we will check her TSH in 6 weeks. Continue current meds         I am having Kathy Huerta maintain her hyoscyamine, diclofenac sodium, levothyroxine, cyclobenzaprine, dicyclomine, pantoprazole, Vitamin D (Ergocalciferol), losartan, propranolol, atorvastatin, and sertraline.  No orders of the defined types were placed in this encounter.    I discussed the assessment and treatment plan with the patient. The patient was provided an opportunity to ask questions and all were answered. The patient agreed with the plan and demonstrated an understanding of the instructions.   The patient was advised to call back or seek an in-person evaluation if the symptoms worsen or if the condition fails to improve as anticipated.  I provided 25 minutes of non-face-to-face time during this encounter.   Penni Homans, MD

## 2019-12-29 NOTE — Assessment & Plan Note (Signed)
She is tolerating the Levothyroxine and we will check her TSH in 6 weeks. Continue current meds

## 2019-12-31 LAB — NOVEL CORONAVIRUS, NAA: SARS-CoV-2, NAA: NOT DETECTED

## 2020-01-07 ENCOUNTER — Other Ambulatory Visit: Payer: Self-pay | Admitting: Family Medicine

## 2020-01-08 ENCOUNTER — Other Ambulatory Visit: Payer: Self-pay | Admitting: Family Medicine

## 2020-01-24 ENCOUNTER — Other Ambulatory Visit: Payer: Self-pay | Admitting: Family Medicine

## 2020-02-05 ENCOUNTER — Other Ambulatory Visit: Payer: Self-pay | Admitting: Family Medicine

## 2020-02-14 ENCOUNTER — Other Ambulatory Visit: Payer: Self-pay | Admitting: Family Medicine

## 2020-02-15 ENCOUNTER — Other Ambulatory Visit: Payer: Self-pay | Admitting: Internal Medicine

## 2020-02-15 ENCOUNTER — Other Ambulatory Visit: Payer: Self-pay

## 2020-02-15 ENCOUNTER — Other Ambulatory Visit (INDEPENDENT_AMBULATORY_CARE_PROVIDER_SITE_OTHER): Payer: 59

## 2020-02-15 DIAGNOSIS — E782 Mixed hyperlipidemia: Secondary | ICD-10-CM

## 2020-02-15 DIAGNOSIS — E039 Hypothyroidism, unspecified: Secondary | ICD-10-CM | POA: Diagnosis not present

## 2020-02-15 DIAGNOSIS — I10 Essential (primary) hypertension: Secondary | ICD-10-CM

## 2020-02-15 LAB — LDL CHOLESTEROL, DIRECT: Direct LDL: 106 mg/dL

## 2020-02-15 LAB — TSH: TSH: 3.37 u[IU]/mL (ref 0.35–4.50)

## 2020-02-15 LAB — CBC
HCT: 41.2 % (ref 36.0–46.0)
Hemoglobin: 13.7 g/dL (ref 12.0–15.0)
MCHC: 33.3 g/dL (ref 30.0–36.0)
MCV: 92.6 fl (ref 78.0–100.0)
Platelets: 265 10*3/uL (ref 150.0–400.0)
RBC: 4.45 Mil/uL (ref 3.87–5.11)
RDW: 12.9 % (ref 11.5–15.5)
WBC: 6.4 10*3/uL (ref 4.0–10.5)

## 2020-02-15 LAB — COMPREHENSIVE METABOLIC PANEL
ALT: 22 U/L (ref 0–35)
AST: 22 U/L (ref 0–37)
Albumin: 4.3 g/dL (ref 3.5–5.2)
Alkaline Phosphatase: 91 U/L (ref 39–117)
BUN: 10 mg/dL (ref 6–23)
CO2: 27 mEq/L (ref 19–32)
Calcium: 9.7 mg/dL (ref 8.4–10.5)
Chloride: 105 mEq/L (ref 96–112)
Creatinine, Ser: 0.64 mg/dL (ref 0.40–1.20)
GFR: 94.69 mL/min (ref >60.00)
Glucose, Bld: 150 mg/dL — ABNORMAL HIGH (ref 70–99)
Potassium: 4.7 mEq/L (ref 3.5–5.1)
Sodium: 139 mEq/L (ref 135–145)
Total Bilirubin: 0.6 mg/dL (ref 0.2–1.2)
Total Protein: 7.2 g/dL (ref 6.0–8.3)

## 2020-02-15 LAB — LIPID PANEL
Cholesterol: 196 mg/dL (ref 0–200)
HDL: 57 mg/dL (ref >39.00)
NonHDL: 138.96
Total CHOL/HDL Ratio: 3
Triglycerides: 245 mg/dL — ABNORMAL HIGH (ref 0.0–149.0)
VLDL: 49 mg/dL — ABNORMAL HIGH (ref 0.0–40.0)

## 2020-02-16 ENCOUNTER — Other Ambulatory Visit: Payer: Self-pay | Admitting: Family Medicine

## 2020-02-16 ENCOUNTER — Other Ambulatory Visit (INDEPENDENT_AMBULATORY_CARE_PROVIDER_SITE_OTHER): Payer: 59

## 2020-02-16 DIAGNOSIS — R739 Hyperglycemia, unspecified: Secondary | ICD-10-CM

## 2020-02-17 LAB — HEMOGLOBIN A1C: Hgb A1c MFr Bld: 5.7 % (ref 4.6–6.5)

## 2020-02-21 ENCOUNTER — Encounter: Payer: Self-pay | Admitting: Family Medicine

## 2020-02-21 ENCOUNTER — Other Ambulatory Visit: Payer: Self-pay

## 2020-02-21 ENCOUNTER — Ambulatory Visit (INDEPENDENT_AMBULATORY_CARE_PROVIDER_SITE_OTHER): Payer: 59 | Admitting: Family Medicine

## 2020-02-21 VITALS — BP 102/78 | HR 80 | Wt 198.0 lb

## 2020-02-21 DIAGNOSIS — R000 Tachycardia, unspecified: Secondary | ICD-10-CM | POA: Diagnosis not present

## 2020-02-21 DIAGNOSIS — E559 Vitamin D deficiency, unspecified: Secondary | ICD-10-CM

## 2020-02-21 DIAGNOSIS — E782 Mixed hyperlipidemia: Secondary | ICD-10-CM | POA: Diagnosis not present

## 2020-02-21 DIAGNOSIS — I10 Essential (primary) hypertension: Secondary | ICD-10-CM

## 2020-02-21 DIAGNOSIS — E039 Hypothyroidism, unspecified: Secondary | ICD-10-CM

## 2020-02-21 DIAGNOSIS — R739 Hyperglycemia, unspecified: Secondary | ICD-10-CM

## 2020-02-21 MED ORDER — VITAMIN D (ERGOCALCIFEROL) 1.25 MG (50000 UNIT) PO CAPS: ORAL_CAPSULE | ORAL | 1 refills | Status: DC

## 2020-02-21 MED ORDER — LEVOTHYROXINE SODIUM 50 MCG PO TABS: 50.0000 ug | ORAL_TABLET | Freq: Every day | ORAL | 1 refills | Status: DC

## 2020-02-21 NOTE — Patient Instructions (Signed)
Omron Blood Pressure cuff, upper arm, want BP 100-140/60-90 Pulse oximeter, want oxygen in 90s  Weekly vitals  Take Multivitamin with minerals, selenium Vitamin D 1000-2000 IU daily Probiotic with lactobacillus and bifidophilus Asprin EC 81 mg daily  Melatonin 2-5 mg at bedtime Benefiber powder once to twice daily [email or web address].[email or web address] ToxicBlast.pl [email or web address] The mRNA technology has been in development for 20 years and we already had the Coronavirus family of viruses (which usually just cause the common cold) genetically mapped already which is why we were able to come up with viable vaccine candidates so quickly in stage 1, then stage 2 scientifically took the correct amount of time what we did to speed it up was just build the manufacturing platform at the same time we were running the experiments so if it worked we could produce faster. And stage 3 has now had many months and millions of people immunized and we are seeing the immunity hold for over 9 months now with sign of it dissipating and no significant numbers of adverse reactions.  During every flu season we see 2 anaphylactic reactions for every million shots given and we initially thought we would see 11 per million with the COVID vaccine but now we see only 2-3 with Moderna and 5 or so with [place] so compared to someone is dying every 20 minutes from [place] and more deadly and infectious strains are coming it is definitely best when weighing the risks and benefits to take the shots.  Another pooled analysis of the 5 most utilized vaccines in the world shows that after full immunization so far no one has died from [place].

## 2020-02-22 NOTE — Assessment & Plan Note (Signed)
Well controlled, no changes to meds. Encouraged heart healthy diet such as the DASH diet and exercise as tolerated.  °

## 2020-02-22 NOTE — Assessment & Plan Note (Signed)
Supplement and monitor 

## 2020-02-22 NOTE — Assessment & Plan Note (Signed)
hgba1c acceptable, minimize simple carbs. Increase exercise as tolerated.  

## 2020-02-22 NOTE — Assessment & Plan Note (Signed)
On Levothyroxine, continue to monitor 

## 2020-02-22 NOTE — Progress Notes (Addendum)
Virtual Visit via phone Note  I connected with Kathy Huerta on 02/22/20 at  2:20 PM EST by a phone enabled telemedicine application and verified that I am speaking with the correct person using two identifiers.  Location: Patient: home Provider: office   I discussed the limitations of evaluation and management by telemedicine and the availability of in person appointments. The patient expressed understanding and agreed to proceed. Wynonia Musty, CMA was able to set the patient up on a visit, phone after being unable to set up a video visit.    Subjective:    Patient ID: Kathy Huerta, female    DOB: 25-Mar-1961, 59 y.o.   MRN: [id number]  Chief Complaint  Patient presents with  . Lab Work    tsh level  . Medication Refill    HPI Patient is in today for follow up on chronic medical concerns. She is feeling well. No recent febrile illness or hospitalizations. She is eating well and maintaining quarantine. Denies CP/palp/SOB/HA/congestion/fevers/GI or GU c/o. Taking meds as prescribed  Past Medical History:  Diagnosis Date  . Anxiety   . Arthritis of right hip 10/28/2016  . Basal cell carcinoma 11/17/11   Dr. Sammuel Hines  . Cutaneous skin tags 07/24/2013  . DDD (degenerative disc disease), cervical    s/p fusion  . DDD (degenerative disc disease), thoracolumbar    on disability  . Depression   . Headache(784.0) 01/29/2013  . Hearing loss    Saw ENT on 04/27/2017; Moderate hearing loss in both ears.  . Heartburn   . Hiatal hernia   . HTN (hypertension) 2002  . Hyperlipemia 08/2014  . Hyperplastic colon polyp   . Hypothyroid   . Internal hemorrhoids   . Obesity   . Obesity 04/30/2017  . Pain in joint, shoulder region 11/20/2013  . Pancreatitis 04/24/2011  . Pneumonia 07/24/2013  . Preventative health care 10/28/2016  . Tachycardia 08/12/2014  . Vitamin D deficiency 11/18/2015    Past Surgical History:  Procedure Laterality Date  . ANTERIOR FUSION CERVICAL SPINE  2008   C6-7  Dr  Saintclair Halsted  . CHOLECYSTECTOMY N/A 02/27/2013   Procedure: LAPAROSCOPIC CHOLECYSTECTOMY WITH INTRAOPERATIVE CHOLANGIOGRAM;  Surgeon: Adin Hector, MD;  Location: [facility];  Service: General;  Laterality: N/A;  . POSTERIOR FUSION CERVICAL SPINE  2011   C6-7    Family History  Problem Relation Age of Onset  . Hypertension Mother   . Hyperlipidemia Mother   . Other Father        DDD, colectomy,cardiac stent, afib  . Colon polyps Father   . Hypertension Brother   . Diabetes Maternal Grandmother   . Colon cancer Neg Hx   . Stomach cancer Neg Hx   . Kidney disease Neg Hx     Social History   Socioeconomic History  . Marital status: Married    Spouse name: Not on file  . Number of children: 0  . Years of education: Not on file  . Highest education level: Not on file  Occupational History  . Occupation: Disabled    Employer: UNEMPLOYED  Tobacco Use  . Smoking status: Never Smoker  . Smokeless tobacco: Never Used  Substance and Sexual Activity  . Alcohol use: Yes    Alcohol/week: 5.0 standard drinks    Types: 5 Cans of beer per week  . Drug use: No  . Sexual activity: Not on file  Other Topics Concern  . Not on file  Social History Narrative   Reviewed  history and no changes required.   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   Regular exercise-no   Smoking Status:  quit   Caffeine use/day:  none   Does Patient Exercise:  no         Social Determinants of Health   Financial Resource Strain:   . Difficulty of Paying Living Expenses: Not on file  Food Insecurity:   . Worried About Charity fundraiser in the Last Year: Not on file  . Ran Out of Food in the Last Year: Not on file  Transportation Needs:   . Lack of Transportation (Medical): Not on file  . Lack of Transportation (Non-Medical): Not on file  Physical Activity:   . Days of Exercise per Week: Not on file  .  Minutes of Exercise per Session: Not on file  Stress:   . Feeling of Stress : Not on file  Social Connections:   . Frequency of Communication with Friends and Family: Not on file  . Frequency of Social Gatherings with Friends and Family: Not on file  . Attends Religious Services: Not on file  . Active Member of Clubs or Organizations: Not on file  . Attends Archivist Meetings: Not on file  . Marital Status: Not on file  Intimate Partner Violence:   . Fear of Current or Ex-Partner: Not on file  . Emotionally Abused: Not on file  . Physically Abused: Not on file  . Sexually Abused: Not on file    Outpatient Medications Prior to Visit  Medication Sig Dispense Refill  . atorvastatin (LIPITOR) 40 MG tablet TAKE ONE TABLET BY MOUTH DAILY 30 tablet 0  . cyclobenzaprine (FLEXERIL) 10 MG tablet TAKE ONE TABLET BY MOUTH TWICE A DAY AS NEEDED FOR MUSCLE SPASMS 60 tablet 0  . diclofenac sodium (VOLTAREN) 1 % GEL Apply 4 g topically 4 (four) times daily as needed. 300 g 5  . dicyclomine (BENTYL) 10 MG capsule Take 1-2 capsules (10-20 mg total) by mouth every 6 (six) hours as needed for spasms. 120 capsule 2  . hyoscyamine (LEVSIN SL) 0.125 MG SL tablet TAKE 1 TO 2 TABLETS BY MOUTH EVERY 6 HOURS AS NEEDED FOR CRAMPY ABDOMINAL PAIN 120 tablet 11  . losartan (COZAAR) 25 MG tablet TAKE ONE TABLET BY MOUTH DAILY 30 tablet 0  . pantoprazole (PROTONIX) 40 MG tablet TAKE ONE TABLET BY MOUTH DAILY 30 tablet 5  . propranolol (INDERAL) 40 MG tablet TAKE ONE TABLET BY MOUTH TWICE A DAY 60 tablet 0  . sertraline (ZOLOFT) 100 MG tablet TAKE 1 AND 1/2 TABLET BY MOUTH DAILY 45 tablet 5  . levothyroxine (SYNTHROID) 25 MCG tablet TAKE ONE TABLET BY MOUTH DAILY BEFORE BREAKFAST 30 tablet 2  . Vitamin D, Ergocalciferol, (DRISDOL) 1.25 MG (50000 UT) CAPS capsule TAKE ONE CAPSULE BY MOUTH EVERY 7 DAYS 4 capsule 3   No facility-administered medications prior to visit.    Allergies  Allergen Reactions  .  Amoxicillin-Pot Clavulanate     REACTION: Throat swelling.  . Azithromycin     REACTION: Throat Swelling  . Cefazolin     REACTION: Throat Swelling  . Moxifloxacin     REACTION: Throat Swelling  . Penicillins     REACTION: throat swelling  . Sulfonamide Derivatives     Review of Systems  Constitutional: Negative for fever and malaise/fatigue.  HENT: Negative for  congestion.   Eyes: Negative for blurred vision.  Respiratory: Negative for shortness of breath.   Cardiovascular: Negative for chest pain, palpitations and leg swelling.  Gastrointestinal: Negative for abdominal pain, blood in stool and nausea.  Genitourinary: Negative for dysuria and frequency.  Musculoskeletal: Negative for falls.  Skin: Negative for rash.  Neurological: Negative for dizziness, loss of consciousness and headaches.  Endo/Heme/Allergies: Negative for environmental allergies.  Psychiatric/Behavioral: Negative for depression. The patient is not nervous/anxious.        Objective:    Physical Exam Constitutional:      Appearance: Normal appearance. She is not ill-appearing.  HENT:     Head: Normocephalic and atraumatic.     Nose: Nose normal.  Eyes:     General:        Right eye: No discharge.        Left eye: Discharge present. Pulmonary:     Effort: Pulmonary effort is normal.  Neurological:     Mental Status: She is alert and oriented to person, place, and time.  Psychiatric:        Behavior: Behavior normal.     BP 102/78   Pulse 80   Wt 198 lb (89.8 kg)   LMP 09/23/2011   BMI 31.01 kg/m  Wt Readings from Last 3 Encounters:  02/21/20 198 lb (89.8 kg)  12/29/19 197 lb (89.4 kg)  10/28/19 198 lb (89.8 kg)    Diabetic Foot Exam - Simple   No data filed     Lab Results  Component Value Date   WBC 6.4 02/15/2020   HGB 13.7 02/15/2020   HCT 41.2 02/15/2020   PLT 265.0 02/15/2020   GLUCOSE 150 (H) 02/15/2020   CHOL 196 02/15/2020   TRIG 245.0 (H) 02/15/2020   HDL 57.00  02/15/2020   LDLDIRECT 106.0 02/15/2020   LDLCALC 87 02/01/2019   ALT 22 02/15/2020   AST 22 02/15/2020   NA 139 02/15/2020   K 4.7 02/15/2020   CL 105 02/15/2020   CREATININE 0.64 02/15/2020   BUN 10 02/15/2020   CO2 27 02/15/2020   TSH 3.37 02/15/2020   HGBA1C 5.7 02/16/2020    Lab Results  Component Value Date   TSH 3.37 02/15/2020   Lab Results  Component Value Date   WBC 6.4 02/15/2020   HGB 13.7 02/15/2020   HCT 41.2 02/15/2020   MCV 92.6 02/15/2020   PLT 265.0 02/15/2020   Lab Results  Component Value Date   NA 139 02/15/2020   K 4.7 02/15/2020   CO2 27 02/15/2020   GLUCOSE 150 (H) 02/15/2020   BUN 10 02/15/2020   CREATININE 0.64 02/15/2020   BILITOT 0.6 02/15/2020   ALKPHOS 91 02/15/2020   AST 22 02/15/2020   ALT 22 02/15/2020   PROT 7.2 02/15/2020   ALBUMIN 4.3 02/15/2020   CALCIUM 9.7 02/15/2020   GFR 94.69 02/15/2020   Lab Results  Component Value Date   CHOL 196 02/15/2020   Lab Results  Component Value Date   HDL 57.00 02/15/2020   Lab Results  Component Value Date   LDLCALC 87 02/01/2019   Lab Results  Component Value Date   TRIG 245.0 (H) 02/15/2020   Lab Results  Component Value Date   CHOLHDL 3 02/15/2020   Lab Results  Component Value Date   HGBA1C 5.7 02/16/2020       Assessment & Plan:   Problem List Items Addressed This Visit    Hypertension    Well controlled,  no changes to meds. Encouraged heart healthy diet such as the DASH diet and exercise as tolerated.       Relevant Orders   CBC   Comprehensive metabolic panel   Hyperlipidemia - Primary    Encouraged heart healthy diet, increase exercise, avoid trans fats, consider a krill oil cap daily, tolerating Atorvastatin 40 mg qhs      Relevant Orders   Lipid panel   Tachycardia   Vitamin D deficiency    Supplement and monitor      Relevant Orders   VITAMIN D 25 Hydroxy (Vit-D Deficiency, Fractures)   Hyperglycemia    hgba1c acceptable, minimize simple  carbs. Increase exercise as tolerated.       Relevant Orders   Hemoglobin A1c   Hypothyroidism    On Levothyroxine, continue to monitor      Relevant Medications   levothyroxine (SYNTHROID) 50 MCG tablet   Other Relevant Orders   TSH   T4, free      I have discontinued Santiago Glad F. Zalenski's levothyroxine. I have also changed her Vitamin D (Ergocalciferol). Additionally, I am having her start on levothyroxine. Lastly, I am having her maintain her hyoscyamine, diclofenac sodium, cyclobenzaprine, dicyclomine, pantoprazole, sertraline, losartan, propranolol, and atorvastatin.  Meds ordered this encounter  Medications  . Vitamin D, Ergocalciferol, (DRISDOL) 1.25 MG (50000 UNIT) CAPS capsule    Sig: TAKE ONE CAPSULE BY MOUTH EVERY 7 DAYS    Dispense:  12 capsule    Refill:  1  . levothyroxine (SYNTHROID) 50 MCG tablet    Sig: Take 1 tablet (50 mcg total) by mouth daily.    Dispense:  90 tablet    Refill:  1   I discussed the assessment and treatment plan with the patient. The patient was provided an opportunity to ask questions and all were answered. The patient agreed with the plan and demonstrated an understanding of the instructions.   The patient was advised to call back or seek an in-person evaluation if the symptoms worsen or if the condition fails to improve as anticipated.  I provided 25 minutes of non-face-to-face time during this encounter.     Penni Homans, MD

## 2020-02-22 NOTE — Assessment & Plan Note (Addendum)
Encouraged heart healthy diet, increase exercise, avoid trans fats, consider a krill oil cap daily, tolerating Atorvastatin 40 mg qhs

## 2020-02-23 ENCOUNTER — Ambulatory Visit: Payer: 59 | Admitting: Family Medicine

## 2020-03-06 ENCOUNTER — Other Ambulatory Visit: Payer: Self-pay | Admitting: Family Medicine

## 2020-03-13 ENCOUNTER — Other Ambulatory Visit: Payer: Self-pay | Admitting: Family Medicine

## 2020-03-18 ENCOUNTER — Other Ambulatory Visit: Payer: Self-pay | Admitting: Family Medicine

## 2020-04-15 ENCOUNTER — Other Ambulatory Visit: Payer: Self-pay | Admitting: Family Medicine

## 2020-04-17 ENCOUNTER — Other Ambulatory Visit: Payer: Self-pay | Admitting: Family Medicine

## 2020-06-02 ENCOUNTER — Other Ambulatory Visit: Payer: Self-pay | Admitting: Family Medicine

## 2020-07-01 ENCOUNTER — Other Ambulatory Visit: Payer: Self-pay | Admitting: Internal Medicine

## 2020-07-19 ENCOUNTER — Other Ambulatory Visit: Payer: Self-pay | Admitting: Family Medicine

## 2020-07-31 ENCOUNTER — Other Ambulatory Visit: Payer: Self-pay | Admitting: Family Medicine

## 2020-09-13 ENCOUNTER — Other Ambulatory Visit: Payer: Self-pay | Admitting: Family Medicine

## 2020-09-15 ENCOUNTER — Other Ambulatory Visit: Payer: Self-pay | Admitting: Family Medicine

## 2020-10-01 ENCOUNTER — Other Ambulatory Visit: Payer: Self-pay

## 2020-10-01 ENCOUNTER — Ambulatory Visit (INDEPENDENT_AMBULATORY_CARE_PROVIDER_SITE_OTHER): Payer: 59

## 2020-10-01 DIAGNOSIS — Z23 Encounter for immunization: Secondary | ICD-10-CM

## 2020-10-02 ENCOUNTER — Other Ambulatory Visit: Payer: Self-pay | Admitting: Family Medicine

## 2020-11-01 ENCOUNTER — Other Ambulatory Visit: Payer: Self-pay | Admitting: Internal Medicine

## 2020-11-10 ENCOUNTER — Other Ambulatory Visit: Payer: Self-pay | Admitting: Family Medicine

## 2020-11-28 ENCOUNTER — Other Ambulatory Visit: Payer: Self-pay | Admitting: Internal Medicine

## 2020-11-30 ENCOUNTER — Other Ambulatory Visit: Payer: Self-pay | Admitting: Family Medicine

## 2020-11-30 ENCOUNTER — Encounter: Payer: Self-pay | Admitting: Family Medicine

## 2020-12-17 ENCOUNTER — Other Ambulatory Visit: Payer: Self-pay | Admitting: Family Medicine

## 2021-02-04 ENCOUNTER — Other Ambulatory Visit: Payer: Self-pay | Admitting: Family Medicine

## 2021-02-15 ENCOUNTER — Other Ambulatory Visit: Payer: Self-pay | Admitting: Family Medicine

## 2021-03-14 ENCOUNTER — Other Ambulatory Visit: Payer: Self-pay | Admitting: Family Medicine

## 2021-04-02 ENCOUNTER — Encounter: Payer: 59 | Admitting: Family Medicine

## 2021-04-03 ENCOUNTER — Other Ambulatory Visit: Payer: Self-pay | Admitting: Internal Medicine

## 2021-04-04 ENCOUNTER — Other Ambulatory Visit: Payer: Self-pay | Admitting: Family Medicine

## 2021-04-12 ENCOUNTER — Encounter: Payer: Self-pay | Admitting: Family Medicine

## 2021-04-12 ENCOUNTER — Other Ambulatory Visit: Payer: Self-pay

## 2021-04-12 ENCOUNTER — Ambulatory Visit: Payer: 59 | Attending: Internal Medicine

## 2021-04-12 ENCOUNTER — Ambulatory Visit (INDEPENDENT_AMBULATORY_CARE_PROVIDER_SITE_OTHER): Payer: 59 | Admitting: Family Medicine

## 2021-04-12 VITALS — BP 108/72 | HR 72 | Temp 97.7°F | Resp 16 | Ht 67.0 in | Wt 203.0 lb

## 2021-04-12 DIAGNOSIS — E782 Mixed hyperlipidemia: Secondary | ICD-10-CM

## 2021-04-12 DIAGNOSIS — Z23 Encounter for immunization: Secondary | ICD-10-CM

## 2021-04-12 DIAGNOSIS — E559 Vitamin D deficiency, unspecified: Secondary | ICD-10-CM

## 2021-04-12 DIAGNOSIS — Z1239 Encounter for other screening for malignant neoplasm of breast: Secondary | ICD-10-CM | POA: Diagnosis not present

## 2021-04-12 DIAGNOSIS — R739 Hyperglycemia, unspecified: Secondary | ICD-10-CM | POA: Diagnosis not present

## 2021-04-12 DIAGNOSIS — Z86010 Personal history of colonic polyps: Secondary | ICD-10-CM

## 2021-04-12 DIAGNOSIS — I10 Essential (primary) hypertension: Secondary | ICD-10-CM | POA: Diagnosis not present

## 2021-04-12 DIAGNOSIS — F3341 Major depressive disorder, recurrent, in partial remission: Secondary | ICD-10-CM

## 2021-04-12 DIAGNOSIS — Z0000 Encounter for general adult medical examination without abnormal findings: Secondary | ICD-10-CM

## 2021-04-12 DIAGNOSIS — G4700 Insomnia, unspecified: Secondary | ICD-10-CM

## 2021-04-12 DIAGNOSIS — E039 Hypothyroidism, unspecified: Secondary | ICD-10-CM

## 2021-04-12 DIAGNOSIS — R197 Diarrhea, unspecified: Secondary | ICD-10-CM

## 2021-04-12 LAB — COMPREHENSIVE METABOLIC PANEL
ALT: 16 U/L (ref 0–35)
AST: 15 U/L (ref 0–37)
Albumin: 4.1 g/dL (ref 3.5–5.2)
Alkaline Phosphatase: 84 U/L (ref 39–117)
BUN: 10 mg/dL (ref 6–23)
CO2: 28 mEq/L (ref 19–32)
Calcium: 9.2 mg/dL (ref 8.4–10.5)
Chloride: 106 mEq/L (ref 96–112)
Creatinine, Ser: 0.62 mg/dL (ref 0.40–1.20)
GFR: 96.36 mL/min (ref >60.00)
Glucose, Bld: 84 mg/dL (ref 70–99)
Potassium: 4.4 mEq/L (ref 3.5–5.1)
Sodium: 142 mEq/L (ref 135–145)
Total Bilirubin: 0.6 mg/dL (ref 0.2–1.2)
Total Protein: 6.9 g/dL (ref 6.0–8.3)

## 2021-04-12 LAB — LIPID PANEL
Cholesterol: 167 mg/dL (ref 0–200)
HDL: 66.2 mg/dL (ref >39.00)
LDL Cholesterol: 76 mg/dL (ref 0–99)
NonHDL: 100.75
Total CHOL/HDL Ratio: 3
Triglycerides: 124 mg/dL (ref 0.0–149.0)
VLDL: 24.8 mg/dL (ref 0.0–40.0)

## 2021-04-12 LAB — CBC
HCT: 40.7 % (ref 36.0–46.0)
Hemoglobin: 13.5 g/dL (ref 12.0–15.0)
MCHC: 33.2 g/dL (ref 30.0–36.0)
MCV: 91.4 fl (ref 78.0–100.0)
Platelets: 257 10*3/uL (ref 150.0–400.0)
RBC: 4.46 Mil/uL (ref 3.87–5.11)
RDW: 13.3 % (ref 11.5–15.5)
WBC: 7.8 10*3/uL (ref 4.0–10.5)

## 2021-04-12 LAB — VITAMIN D 25 HYDROXY (VIT D DEFICIENCY, FRACTURES): VITD: 22.67 ng/mL — ABNORMAL LOW (ref 30.00–100.00)

## 2021-04-12 LAB — HEMOGLOBIN A1C: Hgb A1c MFr Bld: 5.8 % (ref 4.6–6.5)

## 2021-04-12 LAB — TSH: TSH: 1.36 u[IU]/mL (ref 0.35–4.50)

## 2021-04-12 MED ORDER — PANTOPRAZOLE SODIUM 40 MG PO TBEC
DELAYED_RELEASE_TABLET | ORAL | 3 refills | Status: DC
Start: 2021-04-12 — End: 2022-05-05

## 2021-04-12 MED ORDER — SERTRALINE HCL 200 MG PO CAPS: 200.0000 mg | ORAL_CAPSULE | Freq: Every day | ORAL | 1 refills | Status: DC

## 2021-04-12 MED ORDER — DIPHENOXYLATE-ATROPINE 2.5-0.025 MG PO TABS: 1.0000 | ORAL_TABLET | Freq: Four times a day (QID) | ORAL | 1 refills | Status: DC | PRN

## 2021-04-12 NOTE — Patient Instructions (Signed)
Preventive Care 61-61 Years Old, Female Preventive care refers to lifestyle choices and visits with your health care provider that can promote health and wellness. This includes:  A yearly physical exam. This is also called an annual wellness visit.  Regular dental and eye exams.  Immunizations.  Screening for certain conditions.  Healthy lifestyle choices, such as: ? Eating a healthy diet. ? Getting regular exercise. ? Not using drugs or products that contain nicotine and tobacco. ? Limiting alcohol use. What can I expect for my preventive care visit? Physical exam Your health care provider will check your:  Height and weight. These may be used to calculate your BMI (body mass index). BMI is a measurement that tells if you are at a healthy weight.  Heart rate and blood pressure.  Body temperature.  Skin for abnormal spots. Counseling Your health care provider may ask you questions about your:  Past medical problems.  Family's medical history.  Alcohol, tobacco, and drug use.  Emotional well-being.  Home life and relationship well-being.  Sexual activity.  Diet, exercise, and sleep habits.  Work and work Statistician.  Access to firearms.  Method of birth control.  Menstrual cycle.  Pregnancy history. What immunizations do I need? Vaccines are usually given at various ages, according to a schedule. Your health care provider will recommend vaccines for you based on your age, medical history, and lifestyle or other factors, such as travel or where you work.   What tests do I need? Blood tests  Lipid and cholesterol levels. These may be checked every 5 years, or more often if you are over 3 years old.  Hepatitis C test.  Hepatitis B test. Screening  Lung cancer screening. You may have this screening every year starting at age 73 if you have a 30-pack-year history of smoking and currently smoke or have quit within the past 15 years.  Colorectal cancer  screening. ? All adults should have this screening starting at age 52 and continuing until age 17. ? Your health care provider may recommend screening at age 49 if you are at increased risk. ? You will have tests every 1-10 years, depending on your results and the type of screening test.  Diabetes screening. ? This is done by checking your blood sugar (glucose) after you have not eaten for a while (fasting). ? You may have this done every 1-3 years.  Mammogram. ? This may be done every 1-2 years. ? Talk with your health care provider about when you should start having regular mammograms. This may depend on whether you have a family history of breast cancer.  BRCA-related cancer screening. This may be done if you have a family history of breast, ovarian, tubal, or peritoneal cancers.  Pelvic exam and Pap test. ? This may be done every 3 years starting at age 10. ? Starting at age 11, this may be done every 5 years if you have a Pap test in combination with an HPV test. Other tests  STD (sexually transmitted disease) testing, if you are at risk.  Bone density scan. This is done to screen for osteoporosis. You may have this scan if you are at high risk for osteoporosis. Talk with your health care provider about your test results, treatment options, and if necessary, the need for more tests. Follow these instructions at home: Eating and drinking  Eat a diet that includes fresh fruits and vegetables, whole grains, lean protein, and low-fat dairy products.  Take vitamin and mineral supplements  as recommended by your health care provider.  Do not drink alcohol if: ? Your health care provider tells you not to drink. ? You are pregnant, may be pregnant, or are planning to become pregnant.  If you drink alcohol: ? Limit how much you have to 0-1 drink a day. ? Be aware of how much alcohol is in your drink. In the [place] one drink equals one 12 oz bottle of beer (355 mL), one 5 oz glass of  wine (148 mL), or one 1 oz glass of hard liquor (44 mL).   Lifestyle  Take daily care of your teeth and gums. Brush your teeth every morning and night with fluoride toothpaste. Floss one time each day.  Stay active. Exercise for at least 30 minutes 5 or more days each week.  Do not use any products that contain nicotine or tobacco, such as cigarettes, e-cigarettes, and chewing tobacco. If you need help quitting, ask your health care provider.  Do not use drugs.  If you are sexually active, practice safe sex. Use a condom or other form of protection to prevent STIs (sexually transmitted infections).  If you do not wish to become pregnant, use a form of birth control. If you plan to become pregnant, see your health care provider for a prepregnancy visit.  If told by your health care provider, take low-dose aspirin daily starting at age 50.  Find healthy ways to cope with stress, such as: ? Meditation, yoga, or listening to music. ? Journaling. ? Talking to a trusted person. ? Spending time with friends and family. Safety  Always wear your seat belt while driving or riding in a vehicle.  Do not drive: ? If you have been drinking alcohol. Do not ride with someone who has been drinking. ? When you are tired or distracted. ? While texting.  Wear a helmet and other protective equipment during sports activities.  If you have firearms in your house, make sure you follow all gun safety procedures. What's next?  Visit your health care provider once a year for an annual wellness visit.  Ask your health care provider how often you should have your eyes and teeth checked.  Stay up to date on all vaccines. This information is not intended to replace advice given to you by your health care provider. Make sure you discuss any questions you have with your health care provider. Document Revised: 09/11/2020 Document Reviewed: 08/19/2018 Elsevier Patient Education  2021 Elsevier Inc.  

## 2021-04-12 NOTE — Progress Notes (Signed)
   Covid-19 Vaccination Clinic  Name:  Kathy Huerta    MRN: [id number] DOB: 09/05/1961  04/12/2021  [name] was observed post Covid-19 immunization for 15 minutes without incident. She was provided with Vaccine Information Sheet and instruction to access the V-Safe system.   [name] was instructed to call 911 with any severe reactions post vaccine: Marland Kitchen Difficulty breathing  . Swelling of face and throat  . A fast heartbeat  . A bad rash all over body  . Dizziness and weakness   Immunizations Administered    Name Date Dose VIS Date Route   Moderna Covid-19 Booster Vaccine 04/12/2021  1:03 PM 0.25 mL 10/10/2020 Intramuscular   Manufacturer: Moderna   Lot: [id number]   [place]: 29476-546-50

## 2021-04-14 DIAGNOSIS — R197 Diarrhea, unspecified: Secondary | ICD-10-CM | POA: Insufficient documentation

## 2021-04-14 DIAGNOSIS — G4700 Insomnia, unspecified: Secondary | ICD-10-CM | POA: Insufficient documentation

## 2021-04-14 NOTE — Assessment & Plan Note (Signed)
Encouraged good sleep hygiene such as dark, quiet room. No blue/green glowing lights such as computer screens in bedroom. No alcohol or stimulants in evening. Cut down on caffeine as able. Regular exercise is helpful but not just prior to bed time.  

## 2021-04-14 NOTE — Assessment & Plan Note (Signed)
Very sad and tearful after being widowed a couple of months ago after nearly 7 years of marriage. We will increase Sertraline to 200 mg daily and she is doing counseling with Hospice. She will let us know if she feels we need to make more changes. Spent 20 minutes discussing current state and plans for managing the next few months of grieving.

## 2021-04-14 NOTE — Assessment & Plan Note (Signed)
Supplement and monitor 

## 2021-04-14 NOTE — Assessment & Plan Note (Signed)
Encouraged heart healthy diet, increase exercise, avoid trans fats, consider a krill oil cap daily 

## 2021-04-14 NOTE — Assessment & Plan Note (Signed)
Last colonoscopy 2020 repeat in 2023

## 2021-04-14 NOTE — Assessment & Plan Note (Signed)
Has been struggling with several loose stool a day the last few months, she releates it to stress. No bloody or tarry stool she is asked to add Benefiber bid and avoid fatty and acidic foods. If no improvement she will need to see gastroenterology

## 2021-04-14 NOTE — Assessment & Plan Note (Signed)
hgba1c acceptable, minimize simple carbs. Increase exercise as tolerated.  

## 2021-04-14 NOTE — Progress Notes (Signed)
Subjective:    Patient ID: Kathy Huerta, female    DOB: 10/04/1961, 60 y.o.   MRN: [id number]  Chief Complaint  Patient presents with  . Annual Exam    Pt has no problems or concerns.    HPI Patient is in today for annual preventative exam and follow up on chronic medical concerns. No recent febrile illness or hospitalizations. She lost her husband a couple of months ago and she is very sad. He had a rare malignancy which took a month to diagnosis as he worsened. She is getting some support from Hospice but she is alone with their 3 dogs and 5 birds most of the time. No suicidal ideation but she is noting anhedonia and cries a lot. She is trying to eat well but acknowledges that she is not always able no regular exercise. She is struggling with insomnia but does nont have any interest in medications. She has been noting several loose stool daily over the past few months but bloody or tarry stool. Denies CP/palp/SOB/HA/congestion/fevers or GU c/o. Taking meds as prescribed  Past Medical History:  Diagnosis Date  . Anxiety   . Arthritis of right hip 10/28/2016  . Basal cell carcinoma 11/17/11   Dr. Sammuel Hines  . Cutaneous skin tags 07/24/2013  . DDD (degenerative disc disease), cervical    s/p fusion  . DDD (degenerative disc disease), thoracolumbar    on disability  . Depression   . Headache(784.0) 01/29/2013  . Hearing loss    Saw ENT on 04/27/2017; Moderate hearing loss in both ears.  . Heartburn   . Hiatal hernia   . HTN (hypertension) 2002  . Hyperlipemia 08/2014  . Hyperplastic colon polyp   . Hypothyroid   . Internal hemorrhoids   . Obesity   . Obesity 04/30/2017  . Pain in joint, shoulder region 11/20/2013  . Pancreatitis 04/24/2011  . Pneumonia 07/24/2013  . Preventative health care 10/28/2016  . Tachycardia 08/12/2014  . Vitamin D deficiency 11/18/2015    Past Surgical History:  Procedure Laterality Date  . ANTERIOR FUSION CERVICAL SPINE  2008   C6-7  Dr Saintclair Halsted  .  CHOLECYSTECTOMY N/A 02/27/2013   Procedure: LAPAROSCOPIC CHOLECYSTECTOMY WITH INTRAOPERATIVE CHOLANGIOGRAM;  Surgeon: Adin Hector, MD;  Location: [facility];  Service: General;  Laterality: N/A;  . POSTERIOR FUSION CERVICAL SPINE  2011   C6-7    Family History  Problem Relation Age of Onset  . Hypertension Mother   . Hyperlipidemia Mother   . Other Father        DDD, colectomy,cardiac stent, afib  . Colon polyps Father   . Hypertension Brother   . Diabetes Maternal Grandmother   . Colon cancer Neg Hx   . Stomach cancer Neg Hx   . Kidney disease Neg Hx     Social History   Socioeconomic History  . Marital status: Married    Spouse name: Not on file  . Number of children: 0  . Years of education: Not on file  . Highest education level: Not on file  Occupational History  . Occupation: Disabled    Employer: UNEMPLOYED  Tobacco Use  . Smoking status: Never Smoker  . Smokeless tobacco: Never Used  Vaping Use  . Vaping Use: Never used  Substance and Sexual Activity  . Alcohol use: Yes    Alcohol/week: 5.0 standard drinks    Types: 5 Cans of beer per week  . Drug use: No  . Sexual activity: Not on file  Other Topics Concern  . Not on file  Social History Narrative   Reviewed history and no changes required.   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   Regular exercise-no   Smoking Status:  quit   Caffeine use/day:  none   Does Patient Exercise:  no         Social Determinants of Corporate investment bankerHealth   Financial Resource Strain: Not on file  Food Insecurity: Not on file  Transportation Needs: Not on file  Physical Activity: Not on file  Stress: Not on file  Social Connections: Not on file  Intimate Partner Violence: Not on file    Outpatient Medications Prior to Visit  Medication Sig Dispense Refill  . atorvastatin (LIPITOR) 40 MG tablet Take 1 tablet (40 mg total) by mouth  daily. 90 tablet 0  . cyclobenzaprine (FLEXERIL) 10 MG tablet TAKE ONE TABLET BY MOUTH TWICE A DAY AS NEEDED FOR MUSCLE SPASMS 60 tablet 0  . diclofenac sodium (VOLTAREN) 1 % GEL Apply 4 g topically 4 (four) times daily as needed. 300 g 5  . dicyclomine (BENTYL) 10 MG capsule TAKE ONE TO TWO CAPSULES BY MOUTH EVERY 6 HOURS AS NEEDED FOR SPASMS 120 capsule 2  . hyoscyamine (LEVSIN SL) 0.125 MG SL tablet TAKE 1 TO 2 TABLETS BY MOUTH EVERY 6 HOURS AS NEEDED FOR CRAMPY ABDOMINAL PAIN 120 tablet 11  . levothyroxine (SYNTHROID) 50 MCG tablet Take 1 tablet (50 mcg total) by mouth daily before breakfast. 90 tablet 0  . losartan (COZAAR) 25 MG tablet TAKE ONE TABLET BY MOUTH DAILY 90 tablet 1  . propranolol (INDERAL) 40 MG tablet Take 1 tablet (40 mg total) by mouth 2 (two) times daily. 180 tablet 0  . Vitamin D, Ergocalciferol, (DRISDOL) 1.25 MG (50000 UNIT) CAPS capsule TAKE ONE CAPSULE BY MOUTH EVERY 7 DAYS 12 capsule 1  . pantoprazole (PROTONIX) 40 MG tablet TAKE ONE TABLET BY MOUTH DAILY. NEEDS OFFICE VISIT FOR FURTHER REFILLS. 30 tablet 2  . sertraline (ZOLOFT) 100 MG tablet TAKE 1 AND 1/2 TABLET BY MOUTH DAILY 45 tablet 5   No facility-administered medications prior to visit.    Allergies  Allergen Reactions  . Amoxicillin-Pot Clavulanate     REACTION: Throat swelling.  . Azithromycin     REACTION: Throat Swelling  . Cefazolin     REACTION: Throat Swelling  . Moxifloxacin     REACTION: Throat Swelling  . Penicillins     REACTION: throat swelling  . Sulfonamide Derivatives     Review of Systems  Constitutional: Negative for chills, fever and malaise/fatigue.  HENT: Negative for congestion and hearing loss.   Eyes: Negative for discharge.  Respiratory: Negative for cough, sputum production and shortness of breath.   Cardiovascular: Negative for chest pain, palpitations and leg swelling.  Gastrointestinal: Positive for diarrhea. Negative for abdominal pain, blood in stool,  constipation, heartburn, nausea and vomiting.  Genitourinary: Negative for dysuria, frequency, hematuria and urgency.  Musculoskeletal: Negative for back pain, falls and myalgias.  Skin: Negative for rash.  Neurological: Negative for dizziness, sensory change, loss of consciousness, weakness and headaches.  Endo/Heme/Allergies: Negative for environmental allergies. Does not bruise/bleed easily.  Psychiatric/Behavioral: Positive for depression. Negative for suicidal ideas. The patient is nervous/anxious and has insomnia.        Objective:    Physical Exam Constitutional:      General: She is  not in acute distress.    Appearance: She is not diaphoretic.  HENT:     Head: Normocephalic and atraumatic.     Right Ear: External ear normal.     Left Ear: External ear normal.     Nose: Nose normal.     Mouth/Throat:     Pharynx: No oropharyngeal exudate.  Eyes:     General: No scleral icterus.       Right eye: No discharge.        Left eye: No discharge.     Conjunctiva/sclera: Conjunctivae normal.     Pupils: Pupils are equal, round, and reactive to light.  Neck:     Thyroid: No thyromegaly.  Cardiovascular:     Rate and Rhythm: Normal rate and regular rhythm.     Heart sounds: Normal heart sounds. No murmur heard.   Pulmonary:     Effort: Pulmonary effort is normal. No respiratory distress.     Breath sounds: Normal breath sounds. No wheezing or rales.  Abdominal:     General: Bowel sounds are normal. There is no distension.     Palpations: Abdomen is soft. There is no mass.     Tenderness: There is no abdominal tenderness.  Musculoskeletal:        General: No tenderness. Normal range of motion.     Cervical back: Normal range of motion and neck supple.  Lymphadenopathy:     Cervical: No cervical adenopathy.  Skin:    General: Skin is warm and dry.     Findings: No rash.  Neurological:     Mental Status: She is alert and oriented to person, place, and time.     Cranial  Nerves: No cranial nerve deficit.     Coordination: Coordination normal.     Deep Tendon Reflexes: Reflexes are normal and symmetric. Reflexes normal.     BP 108/72   Pulse 72   Temp 97.7 F (36.5 C)   Resp 16   Ht 5\' 7"  (1.702 m)   Wt 203 lb (92.1 kg)   LMP 09/23/2011   SpO2 98%   BMI 31.79 kg/m  Wt Readings from Last 3 Encounters:  04/12/21 203 lb (92.1 kg)  02/21/20 198 lb (89.8 kg)  12/29/19 197 lb (89.4 kg)    Diabetic Foot Exam - Simple   No data filed    Lab Results  Component Value Date   WBC 7.8 04/12/2021   HGB 13.5 04/12/2021   HCT 40.7 04/12/2021   PLT 257.0 04/12/2021   GLUCOSE 84 04/12/2021   CHOL 167 04/12/2021   TRIG 124.0 04/12/2021   HDL 66.20 04/12/2021   LDLDIRECT 106.0 02/15/2020   LDLCALC 76 04/12/2021   ALT 16 04/12/2021   AST 15 04/12/2021   NA 142 04/12/2021   K 4.4 04/12/2021   CL 106 04/12/2021   CREATININE 0.62 04/12/2021   BUN 10 04/12/2021   CO2 28 04/12/2021   TSH 1.36 04/12/2021   HGBA1C 5.8 04/12/2021    Lab Results  Component Value Date   TSH 1.36 04/12/2021   Lab Results  Component Value Date   WBC 7.8 04/12/2021   HGB 13.5 04/12/2021   HCT 40.7 04/12/2021   MCV 91.4 04/12/2021   PLT 257.0 04/12/2021   Lab Results  Component Value Date   NA 142 04/12/2021   K 4.4 04/12/2021   CO2 28 04/12/2021   GLUCOSE 84 04/12/2021   BUN 10 04/12/2021   CREATININE 0.62 04/12/2021  BILITOT 0.6 04/12/2021   ALKPHOS 84 04/12/2021   AST 15 04/12/2021   ALT 16 04/12/2021   PROT 6.9 04/12/2021   ALBUMIN 4.1 04/12/2021   CALCIUM 9.2 04/12/2021   GFR 96.36 04/12/2021   Lab Results  Component Value Date   CHOL 167 04/12/2021   Lab Results  Component Value Date   HDL 66.20 04/12/2021   Lab Results  Component Value Date   LDLCALC 76 04/12/2021   Lab Results  Component Value Date   TRIG 124.0 04/12/2021   Lab Results  Component Value Date   CHOLHDL 3 04/12/2021   Lab Results  Component Value Date   HGBA1C  5.8 04/12/2021       Assessment & Plan:   Problem List Items Addressed This Visit    Hypertension - Primary    Well controlled, no changes to meds. Encouraged heart healthy diet such as the DASH diet and exercise as tolerated.       Relevant Orders   CBC (Completed)   Comprehensive metabolic panel (Completed)   Hyperlipidemia    Encouraged heart healthy diet, increase exercise, avoid trans fats, consider a krill oil cap daily      Relevant Orders   Lipid panel (Completed)   Hx of hyperplastic colonic polyps    Last colonoscopy 2020 repeat in 2023      Depression    Very sad and tearful after being widowed a couple of months ago after nearly 30 years of marriage. We will increase Sertraline to 200 mg daily and she is doing counseling with Hospice. She will let us know if she feels we need to make more changes. Spent 20 minutes discussing current state and plans for managing the next few months of grieving.      Relevant Medications   Sertraline HCl 200 MG CAPS   Vitamin D deficiency    Supplement and monitor      Relevant Orders   VITAMIN D 25 Hydroxy (Vit-D Deficiency, Fractures) (Completed)   Preventative health care    Patient encouraged to maintain heart healthy diet, regular exercise, adequate sleep. Consider daily probiotics. Take medications as prescribed. Labs ordered and reviewed. MGM ordered today      Hyperglycemia    hgba1c acceptable, minimize simple carbs. Increase exercise as tolerated.       Relevant Orders   Hemoglobin A1c (Completed)   Hypothyroidism    On Levothyroxine, continue to monitor      Relevant Orders   TSH (Completed)   Diarrhea    Has been struggling with several loose stool a day the last few months, she releates it to stress. No bloody or tarry stool she is asked to add Benefiber bid and avoid fatty and acidic foods. If no improvement she will need to see gastroenterology      Insomnia    Encouraged good sleep hygiene such as  dark, quiet room. No blue/green glowing lights such as computer screens in bedroom. No alcohol or stimulants in evening. Cut down on caffeine as able. Regular exercise is helpful but not just prior to bed time.        Other Visit Diagnoses    Encounter for screening for malignant neoplasm of breast, unspecified screening modality       Relevant Orders   MM 3D SCREEN BREAST BILATERAL      I have discontinued Clydie Braun F. Eckenrode's sertraline. I am also having her start on diphenoxylate-atropine and Sertraline HCl. Additionally, I am having her maintain  her hyoscyamine, diclofenac sodium, cyclobenzaprine, Vitamin D (Ergocalciferol), dicyclomine, losartan, atorvastatin, propranolol, levothyroxine, and pantoprazole.  Meds ordered this encounter  Medications  . pantoprazole (PROTONIX) 40 MG tablet    Sig: TAKE ONE TABLET BY MOUTH DAILY. NEEDS OFFICE VISIT FOR FURTHER REFILLS.    Dispense:  90 tablet    Refill:  3  . diphenoxylate-atropine (LOMOTIL) 2.5-0.025 MG tablet    Sig: Take 1 tablet by mouth 4 (four) times daily as needed for diarrhea or loose stools.    Dispense:  30 tablet    Refill:  1  . Sertraline HCl 200 MG CAPS    Sig: Take 200 mg by mouth daily.    Dispense:  90 capsule    Refill:  1     Penni Homans, MD

## 2021-04-14 NOTE — Assessment & Plan Note (Signed)
Patient encouraged to maintain heart healthy diet, regular exercise, adequate sleep. Consider daily probiotics. Take medications as prescribed. Labs ordered and reviewed. MGM ordered today 

## 2021-04-14 NOTE — Assessment & Plan Note (Signed)
Well controlled, no changes to meds. Encouraged heart healthy diet such as the DASH diet and exercise as tolerated.  °

## 2021-04-14 NOTE — Assessment & Plan Note (Signed)
On Levothyroxine, continue to monitor 

## 2021-04-15 ENCOUNTER — Other Ambulatory Visit (HOSPITAL_BASED_OUTPATIENT_CLINIC_OR_DEPARTMENT_OTHER): Payer: Self-pay

## 2021-04-15 MED ORDER — MODERNA COVID-19 VACCINE 100 MCG/0.5ML IM SUSP
INTRAMUSCULAR | 0 refills | Status: DC
  Filled 2021-04-15: qty 0.25, 1d supply, fill #0

## 2021-04-17 ENCOUNTER — Other Ambulatory Visit: Payer: Self-pay

## 2021-04-17 MED ORDER — VITAMIN D (ERGOCALCIFEROL) 1.25 MG (50000 UNIT) PO CAPS: ORAL_CAPSULE | ORAL | 4 refills | Status: DC

## 2021-04-24 ENCOUNTER — Encounter: Payer: Self-pay | Admitting: Family Medicine

## 2021-04-25 ENCOUNTER — Other Ambulatory Visit: Payer: Self-pay | Admitting: Family Medicine

## 2021-04-25 DIAGNOSIS — L989 Disorder of the skin and subcutaneous tissue, unspecified: Secondary | ICD-10-CM

## 2021-04-25 MED ORDER — SERTRALINE HCL 100 MG PO TABS: 200.0000 mg | ORAL_TABLET | Freq: Every day | ORAL | 1 refills | Status: DC

## 2021-05-13 ENCOUNTER — Other Ambulatory Visit: Payer: Self-pay | Admitting: Family Medicine

## 2021-05-23 ENCOUNTER — Other Ambulatory Visit: Payer: Self-pay | Admitting: Family Medicine

## 2021-05-23 NOTE — Telephone Encounter (Signed)
Requesting: lomotil Contract: 12/14/17 UDS: 05/07/18  Last Visit: 04/12/21 Next Visit: 08/01/21 Last Refill: 04/12/21  Please Advise

## 2021-05-27 ENCOUNTER — Encounter (HOSPITAL_BASED_OUTPATIENT_CLINIC_OR_DEPARTMENT_OTHER): Payer: Self-pay

## 2021-05-27 ENCOUNTER — Other Ambulatory Visit: Payer: Self-pay

## 2021-05-27 ENCOUNTER — Ambulatory Visit (HOSPITAL_BASED_OUTPATIENT_CLINIC_OR_DEPARTMENT_OTHER)
Admission: RE | Admit: 2021-05-27 | Discharge: 2021-05-27 | Disposition: A | Discharge status: Home / Self Care | Payer: 59 | Source: Ambulatory Visit | Attending: Family Medicine | Admitting: Family Medicine

## 2021-05-27 DIAGNOSIS — Z1231 Encounter for screening mammogram for malignant neoplasm of breast: Secondary | ICD-10-CM | POA: Diagnosis not present

## 2021-05-27 DIAGNOSIS — Z1239 Encounter for other screening for malignant neoplasm of breast: Secondary | ICD-10-CM

## 2021-06-11 ENCOUNTER — Other Ambulatory Visit: Payer: Self-pay | Admitting: Family Medicine

## 2021-06-22 ENCOUNTER — Other Ambulatory Visit: Payer: Self-pay | Admitting: Family Medicine

## 2021-07-08 ENCOUNTER — Other Ambulatory Visit: Payer: Self-pay | Admitting: Family Medicine

## 2021-07-31 ENCOUNTER — Other Ambulatory Visit: Payer: Self-pay | Admitting: Family Medicine

## 2021-07-31 NOTE — Telephone Encounter (Signed)
Requesting: Lomotil Contract:  UDS: 05/07/18 Last Visit: 04/12/21 Next Visit: 08/01/21 Last Refill: 05/23/21  Please Advise

## 2021-08-01 ENCOUNTER — Other Ambulatory Visit: Payer: Self-pay

## 2021-08-01 ENCOUNTER — Telehealth (INDEPENDENT_AMBULATORY_CARE_PROVIDER_SITE_OTHER): Payer: 59 | Admitting: Family Medicine

## 2021-08-01 DIAGNOSIS — K219 Gastro-esophageal reflux disease without esophagitis: Secondary | ICD-10-CM

## 2021-08-01 DIAGNOSIS — R5383 Other fatigue: Secondary | ICD-10-CM

## 2021-08-01 DIAGNOSIS — I10 Essential (primary) hypertension: Secondary | ICD-10-CM

## 2021-08-01 DIAGNOSIS — E782 Mixed hyperlipidemia: Secondary | ICD-10-CM

## 2021-08-01 DIAGNOSIS — E559 Vitamin D deficiency, unspecified: Secondary | ICD-10-CM

## 2021-08-01 DIAGNOSIS — R6889 Other general symptoms and signs: Secondary | ICD-10-CM

## 2021-08-01 DIAGNOSIS — F3341 Major depressive disorder, recurrent, in partial remission: Secondary | ICD-10-CM

## 2021-08-01 DIAGNOSIS — R739 Hyperglycemia, unspecified: Secondary | ICD-10-CM

## 2021-08-01 DIAGNOSIS — R197 Diarrhea, unspecified: Secondary | ICD-10-CM

## 2021-08-01 NOTE — Progress Notes (Signed)
Virtual telephone visit    Virtual Visit via Telephone Note   This visit type was conducted due to national recommendations for restrictions regarding the COVID-19 Pandemic (e.g. social distancing) in an effort to limit this patient's exposure and mitigate transmission in our community. Due to her co-morbid illnesses, this patient is at least at moderate risk for complications without adequate follow up. This format is felt to be most appropriate for this patient at this time. The patient did not have access to video technology or had technical difficulties with video requiring transitioning to audio format only (telephone). Physical exam was limited to content and character of the telephone converstion. S Chism, CMA was able to get the patient set up on a telephone visit.   Patient location: home Patient and provider in visit Provider location: Office  I discussed the limitations of evaluation and management by telemedicine and the availability of in person appointments. The patient expressed understanding and agreed to proceed.   Visit Date: 08/01/2021  Today's healthcare provider: Penni Homans, MD     Subjective:    Patient ID: Kathy Huerta, female    DOB: September 15, 1961, 60 y.o.   MRN: [id number]  Chief Complaint  Patient presents with   Follow-up    HPI Patient is in today for follow up on chronic medical concerns, no recent febrile illness, hospitalization or acute concerns.  She notes the increase in sertraline to 200 mg has been very helpful she continues to be under a great deal of stress.  As she struggles with being widowed she also has both of her parents at [facility] and they both had [place] her father progressed into COVID-pneumonia and that has been very stressful.  The patient herself had at the same time headaches, fatigue, sore throat, rhinorrhea and malaise this was about a month ago.  She did test negative for COVID but she questions whether she had it.  She  continues to have trouble sleeping.  She does note she went to dermatology and they did confirm that the lesion on her face is cancer so she is scheduled to have that removed.  She has not been taking her vitamin D routinely.  She notes Lomotil has been helpful for her diarrhea and she uses it routinely when she needs to go out with good results and no side effects. Denies CP/palp/SOB/HA/congestion/fevers/GI or GU c/o. Taking meds as prescribed   Past Medical History:  Diagnosis Date   Anxiety    Arthritis of right hip 10/28/2016   Basal cell carcinoma 11/17/11   Dr. Sammuel Hines   Cutaneous skin tags 07/24/2013   DDD (degenerative disc disease), cervical    s/p fusion   DDD (degenerative disc disease), thoracolumbar    on disability   Depression    Headache(784.0) 01/29/2013   Hearing loss    Saw ENT on 04/27/2017; Moderate hearing loss in both ears.   Heartburn    Hiatal hernia    HTN (hypertension) 2002   Hyperlipemia 08/2014   Hyperplastic colon polyp    Hypothyroid    Internal hemorrhoids    Obesity    Obesity 04/30/2017   Pain in joint, shoulder region 11/20/2013   Pancreatitis 04/24/2011   Pneumonia 07/24/2013   Preventative health care 10/28/2016   Tachycardia 08/12/2014   Vitamin D deficiency 11/18/2015    Past Surgical History:  Procedure Laterality Date   ANTERIOR FUSION CERVICAL SPINE  2008   C6-7  Dr Saintclair Halsted   CHOLECYSTECTOMY N/A 02/27/2013  Procedure: LAPAROSCOPIC CHOLECYSTECTOMY WITH INTRAOPERATIVE CHOLANGIOGRAM;  Surgeon: Adin Hector, MD;  Location: [facility];  Service: General;  Laterality: N/A;   POSTERIOR FUSION CERVICAL SPINE  2011   C6-7    Family History  Problem Relation Age of Onset   Hypertension Mother    Hyperlipidemia Mother    Other Father        DDD, colectomy,cardiac stent, afib   Colon polyps Father    Hypertension Brother    Diabetes Maternal Grandmother    Colon cancer Neg Hx    Stomach cancer Neg Hx    Kidney disease Neg Hx     Social History    Socioeconomic History   Marital status: Widowed    Spouse name: Not on file   Number of children: 0   Years of education: Not on file   Highest education level: Not on file  Occupational History   Occupation: Disabled    Employer: UNEMPLOYED  Tobacco Use   Smoking status: Never   Smokeless tobacco: Never  Vaping Use   Vaping Use: Never used  Substance and Sexual Activity   Alcohol use: Yes    Alcohol/week: 5.0 standard drinks    Types: 5 Cans of beer per week   Drug use: No   Sexual activity: Not on file  Other Topics Concern   Not on file  Social History Narrative   Reviewed history and no changes required.   Occupation: Disabled,  has worked in the past for a [employer] (medical))   Married   Alcohol use-yes- every weekend 4-5 beers.   Regular exercise-no   Smoking Status:  quit   Caffeine use/day:  none   Does Patient Exercise:  no         Social Determinants of Radio broadcast assistant Strain: Not on file  Food Insecurity: Not on file  Transportation Needs: Not on file  Physical Activity: Not on file  Stress: Not on file  Social Connections: Not on file  Intimate Partner Violence: Not on file    Outpatient Medications Prior to Visit  Medication Sig Dispense Refill   atorvastatin (LIPITOR) 40 MG tablet TAKE ONE TABLET BY MOUTH DAILY 90 tablet 0   COVID-19 mRNA vaccine, Moderna, (MODERNA COVID-19 VACCINE) 100 MCG/0.5ML injection Inject into the muscle. 0.25 mL 0   cyclobenzaprine (FLEXERIL) 10 MG tablet TAKE ONE TABLET BY MOUTH TWICE A DAY AS NEEDED FOR MUSCLE SPASMS 60 tablet 0   diclofenac sodium (VOLTAREN) 1 % GEL Apply 4 g topically 4 (four) times daily as needed. 300 g 5   dicyclomine (BENTYL) 10 MG capsule TAKE ONE TO TWO CAPSULES BY MOUTH EVERY 6 HOURS AS NEEDED FOR SPASMS 120 capsule 2   diphenoxylate-atropine (LOMOTIL) 2.5-0.025 MG tablet TAKE ONE TABLET BY MOUTH FOUR TIMES A DAY AS NEEDED FOR DIARRHEA OR LOOSE STOOLS 30 tablet 1    levothyroxine (SYNTHROID) 50 MCG tablet TAKE ONE TABLET BY MOUTH DAILY BEFORE BREAKFAST 30 tablet 2   losartan (COZAAR) 25 MG tablet TAKE ONE TABLET BY MOUTH DAILY 90 tablet 0   pantoprazole (PROTONIX) 40 MG tablet TAKE ONE TABLET BY MOUTH DAILY. NEEDS OFFICE VISIT FOR FURTHER REFILLS. 90 tablet 3   propranolol (INDERAL) 40 MG tablet TAKE ONE TABLET BY MOUTH TWICE A DAY 60 tablet 1   sertraline (ZOLOFT) 100 MG tablet Take 2 tablets (200 mg total) by mouth daily. 180 tablet 1   Vitamin D, Ergocalciferol, (DRISDOL) 1.25 MG (50000 UNIT) CAPS capsule  TAKE ONE CAPSULE BY MOUTH EVERY 7 DAYS 4 capsule 4   hyoscyamine (LEVSIN SL) 0.125 MG SL tablet TAKE 1 TO 2 TABLETS BY MOUTH EVERY 6 HOURS AS NEEDED FOR CRAMPY ABDOMINAL PAIN 120 tablet 11   No facility-administered medications prior to visit.    Allergies  Allergen Reactions   Amoxicillin-Pot Clavulanate     REACTION: Throat swelling.   Azithromycin     REACTION: Throat Swelling   Cefazolin     REACTION: Throat Swelling   Moxifloxacin     REACTION: Throat Swelling   Penicillins     REACTION: throat swelling   Sulfonamide Derivatives     Review of Systems  Constitutional:  Negative for fever and malaise/fatigue.  HENT:  Negative for congestion.   Eyes:  Negative for blurred vision.  Respiratory:  Negative for shortness of breath.   Cardiovascular:  Negative for chest pain, palpitations and leg swelling.  Gastrointestinal:  Negative for abdominal pain, blood in stool and nausea.  Genitourinary:  Negative for dysuria and frequency.  Musculoskeletal:  Negative for falls.  Skin:  Negative for rash.  Neurological:  Negative for dizziness, loss of consciousness and headaches.  Endo/Heme/Allergies:  Negative for environmental allergies.  Psychiatric/Behavioral:  Positive for depression. The patient is nervous/anxious and has insomnia.       Objective:    Physical Exam Constitutional:      General: She is not in acute distress.     Appearance: Normal appearance. She is not ill-appearing or toxic-appearing.  HENT:     Head: Normocephalic and atraumatic.     Right Ear: External ear normal.     Left Ear: External ear normal.     Nose: Nose normal.  Eyes:     General:        Right eye: No discharge.        Left eye: No discharge.  Pulmonary:     Effort: Pulmonary effort is normal.  Skin:    Findings: No rash.  Neurological:     Mental Status: She is alert and oriented to person, place, and time.  Psychiatric:        Behavior: Behavior normal.    LMP 09/23/2011  Wt Readings from Last 3 Encounters:  04/12/21 203 lb (92.1 kg)  02/21/20 198 lb (89.8 kg)  12/29/19 197 lb (89.4 kg)    Diabetic Foot Exam - Simple   No data filed    Lab Results  Component Value Date   WBC 7.8 04/12/2021   HGB 13.5 04/12/2021   HCT 40.7 04/12/2021   PLT 257.0 04/12/2021   GLUCOSE 84 04/12/2021   CHOL 167 04/12/2021   TRIG 124.0 04/12/2021   HDL 66.20 04/12/2021   LDLDIRECT 106.0 02/15/2020   LDLCALC 76 04/12/2021   ALT 16 04/12/2021   AST 15 04/12/2021   NA 142 04/12/2021   K 4.4 04/12/2021   CL 106 04/12/2021   CREATININE 0.62 04/12/2021   BUN 10 04/12/2021   CO2 28 04/12/2021   TSH 1.36 04/12/2021   HGBA1C 5.8 04/12/2021    Lab Results  Component Value Date   TSH 1.36 04/12/2021   Lab Results  Component Value Date   WBC 7.8 04/12/2021   HGB 13.5 04/12/2021   HCT 40.7 04/12/2021   MCV 91.4 04/12/2021   PLT 257.0 04/12/2021   Lab Results  Component Value Date   NA 142 04/12/2021   K 4.4 04/12/2021   CO2 28 04/12/2021   GLUCOSE 84 04/12/2021  BUN 10 04/12/2021   CREATININE 0.62 04/12/2021   BILITOT 0.6 04/12/2021   ALKPHOS 84 04/12/2021   AST 15 04/12/2021   ALT 16 04/12/2021   PROT 6.9 04/12/2021   ALBUMIN 4.1 04/12/2021   CALCIUM 9.2 04/12/2021   GFR 96.36 04/12/2021   Lab Results  Component Value Date   CHOL 167 04/12/2021   Lab Results  Component Value Date   HDL 66.20 04/12/2021    Lab Results  Component Value Date   LDLCALC 76 04/12/2021   Lab Results  Component Value Date   TRIG 124.0 04/12/2021   Lab Results  Component Value Date   CHOLHDL 3 04/12/2021   Lab Results  Component Value Date   HGBA1C 5.8 04/12/2021       Assessment & Plan:   Problem List Items Addressed This Visit     Hypertension - Primary    Monitor and report any concerns, no changes to meds. Encouraged heart healthy diet such as the DASH diet and exercise as tolerated.       Relevant Orders   CBC   Comprehensive metabolic panel   TSH   GERD (gastroesophageal reflux disease)    Avoid offending foods, start probiotics. Do not eat large meals in late evening and consider raising head of bed.       Hyperlipidemia    Encourage heart healthy diet such as MIND or DASH diet, increase exercise, avoid trans fats, simple carbohydrates and processed foods, consider a krill or fish or flaxseed oil cap daily.       Relevant Orders   Lipid panel   Depression    Still under a great deal of stress but feels the increase in the Sertraline to 200 mg daily has been helpful      Vitamin D deficiency    Supplement and monitor      Relevant Orders   VITAMIN D 25 Hydroxy (Vit-D Deficiency, Fractures)   Hyperglycemia    hgba1c acceptable, minimize simple carbs. Increase exercise as tolerated.       Relevant Orders   Hemoglobin A1c   Diarrhea    Good response to Lomotil she uses one as needed when she has to go out with good results      Other Visit Diagnoses     Congestion of throat       Relevant Orders   SARS CoV2 Serology(COVID19) AB(IgG,IgM),Immunoassay   Fatigue, unspecified type       Relevant Orders   SARS CoV2 Serology(COVID19) AB(IgG,IgM),Immunoassay       I have discontinued Pieter Partridge. Noh's hyoscyamine. I am also having her maintain her diclofenac sodium, cyclobenzaprine, dicyclomine, pantoprazole, Moderna COVID-19 Vaccine, Vitamin D (Ergocalciferol), sertraline,  atorvastatin, propranolol, losartan, levothyroxine, and diphenoxylate-atropine.  No orders of the defined types were placed in this encounter.    I discussed the assessment and treatment plan with the patient. The patient was provided an opportunity to ask questions and all were answered. The patient agreed with the plan and demonstrated an understanding of the instructions.   The patient was advised to call back or seek an in-person evaluation if the symptoms worsen or if the condition fails to improve as anticipated.  I provided 22 minutes of non-face-to-face time during this encounter.   Penni Homans, MD [facility] [place] [phone] (phone) [phone] (fax)  [place]

## 2021-08-01 NOTE — Assessment & Plan Note (Signed)
Supplement and monitor 

## 2021-08-01 NOTE — Assessment & Plan Note (Signed)
Still under a great deal of stress but feels the increase in the Sertraline to 200 mg daily has been helpful

## 2021-08-01 NOTE — Assessment & Plan Note (Signed)
Avoid offending foods, start probiotics. Do not eat large meals in late evening and consider raising head of bed.  

## 2021-08-01 NOTE — Assessment & Plan Note (Signed)
Encourage heart healthy diet such as MIND or DASH diet, increase exercise, avoid trans fats, simple carbohydrates and processed foods, consider a krill or fish or flaxseed oil cap daily.  °

## 2021-08-01 NOTE — Assessment & Plan Note (Signed)
Good response to Lomotil she uses one as needed when she has to go out with good results

## 2021-08-01 NOTE — Assessment & Plan Note (Addendum)
Monitor and report any concerns, no changes to meds. Encouraged heart healthy diet such as the DASH diet and exercise as tolerated.  ?

## 2021-08-01 NOTE — Assessment & Plan Note (Signed)
hgba1c acceptable, minimize simple carbs. Increase exercise as tolerated.  

## 2021-08-06 ENCOUNTER — Other Ambulatory Visit: Payer: Self-pay

## 2021-08-06 ENCOUNTER — Other Ambulatory Visit (INDEPENDENT_AMBULATORY_CARE_PROVIDER_SITE_OTHER): Payer: 59

## 2021-08-06 DIAGNOSIS — R5383 Other fatigue: Secondary | ICD-10-CM

## 2021-08-06 DIAGNOSIS — E559 Vitamin D deficiency, unspecified: Secondary | ICD-10-CM

## 2021-08-06 DIAGNOSIS — I10 Essential (primary) hypertension: Secondary | ICD-10-CM

## 2021-08-06 DIAGNOSIS — R6889 Other general symptoms and signs: Secondary | ICD-10-CM | POA: Diagnosis not present

## 2021-08-06 DIAGNOSIS — R739 Hyperglycemia, unspecified: Secondary | ICD-10-CM | POA: Diagnosis not present

## 2021-08-06 DIAGNOSIS — E782 Mixed hyperlipidemia: Secondary | ICD-10-CM | POA: Diagnosis not present

## 2021-08-06 LAB — COMPREHENSIVE METABOLIC PANEL
ALT: 17 U/L (ref 0–35)
AST: 18 U/L (ref 0–37)
Albumin: 4.4 g/dL (ref 3.5–5.2)
Alkaline Phosphatase: 93 U/L (ref 39–117)
BUN: 12 mg/dL (ref 6–23)
CO2: 25 mEq/L (ref 19–32)
Calcium: 9.4 mg/dL (ref 8.4–10.5)
Chloride: 103 mEq/L (ref 96–112)
Creatinine, Ser: 0.62 mg/dL (ref 0.40–1.20)
GFR: 96.15 mL/min (ref >60.00)
Glucose, Bld: 84 mg/dL (ref 70–99)
Potassium: 4.7 mEq/L (ref 3.5–5.1)
Sodium: 138 mEq/L (ref 135–145)
Total Bilirubin: 0.6 mg/dL (ref 0.2–1.2)
Total Protein: 7.5 g/dL (ref 6.0–8.3)

## 2021-08-06 LAB — LIPID PANEL
Cholesterol: 195 mg/dL (ref 0–200)
HDL: 74.1 mg/dL (ref >39.00)
LDL Cholesterol: 83 mg/dL (ref 0–99)
NonHDL: 120.48
Total CHOL/HDL Ratio: 3
Triglycerides: 189 mg/dL — ABNORMAL HIGH (ref 0.0–149.0)
VLDL: 37.8 mg/dL (ref 0.0–40.0)

## 2021-08-06 LAB — CBC
HCT: 41.9 % (ref 36.0–46.0)
Hemoglobin: 14 g/dL (ref 12.0–15.0)
MCHC: 33.4 g/dL (ref 30.0–36.0)
MCV: 90.7 fl (ref 78.0–100.0)
Platelets: 288 10*3/uL (ref 150.0–400.0)
RBC: 4.62 Mil/uL (ref 3.87–5.11)
RDW: 12.9 % (ref 11.5–15.5)
WBC: 7.4 10*3/uL (ref 4.0–10.5)

## 2021-08-06 LAB — TSH: TSH: 2.36 u[IU]/mL (ref 0.35–5.50)

## 2021-08-06 LAB — HEMOGLOBIN A1C: Hgb A1c MFr Bld: 5.8 % (ref 4.6–6.5)

## 2021-08-06 LAB — VITAMIN D 25 HYDROXY (VIT D DEFICIENCY, FRACTURES): VITD: 25.88 ng/mL — ABNORMAL LOW (ref 30.00–100.00)

## 2021-08-07 ENCOUNTER — Other Ambulatory Visit: Payer: Self-pay | Admitting: Family Medicine

## 2021-08-07 LAB — SARS COV-2 SEROLOGY(COVID-19)AB(IGG,IGM),IMMUNOASSAY
SARS CoV-2 AB IgG: NEGATIVE
SARS CoV-2 IgM: NEGATIVE

## 2021-08-08 ENCOUNTER — Other Ambulatory Visit: Payer: Self-pay | Admitting: Family Medicine

## 2021-09-20 ENCOUNTER — Other Ambulatory Visit: Payer: Self-pay | Admitting: Family Medicine

## 2021-10-04 ENCOUNTER — Other Ambulatory Visit: Payer: Self-pay | Admitting: Family Medicine

## 2021-10-08 ENCOUNTER — Other Ambulatory Visit: Payer: Self-pay

## 2021-10-08 ENCOUNTER — Ambulatory Visit: Payer: 59 | Attending: Internal Medicine

## 2021-10-08 ENCOUNTER — Ambulatory Visit (INDEPENDENT_AMBULATORY_CARE_PROVIDER_SITE_OTHER): Payer: 59

## 2021-10-08 DIAGNOSIS — Z23 Encounter for immunization: Secondary | ICD-10-CM

## 2021-10-08 NOTE — Progress Notes (Signed)
   Covid-19 Vaccination Clinic  Name:  Kathy Huerta    MRN: [id number] DOB: 1961/08/08  10/08/2021  Kathy Huerta was observed post Covid-19 immunization for 15 minutes without incident. She was provided with Vaccine Information Sheet and instruction to access the V-Safe system.   Kathy Huerta was instructed to call 911 with any severe reactions post vaccine: Difficulty breathing  Swelling of face and throat  A fast heartbeat  A bad rash all over body  Dizziness and weakness

## 2021-10-24 ENCOUNTER — Other Ambulatory Visit: Payer: Self-pay | Admitting: Family Medicine

## 2021-11-05 ENCOUNTER — Other Ambulatory Visit: Payer: Self-pay

## 2021-11-05 ENCOUNTER — Telehealth (INDEPENDENT_AMBULATORY_CARE_PROVIDER_SITE_OTHER): Payer: 59 | Admitting: Family Medicine

## 2021-11-05 ENCOUNTER — Other Ambulatory Visit (HOSPITAL_BASED_OUTPATIENT_CLINIC_OR_DEPARTMENT_OTHER): Payer: Self-pay

## 2021-11-05 VITALS — BP 119/71 | HR 76

## 2021-11-05 DIAGNOSIS — Z780 Asymptomatic menopausal state: Secondary | ICD-10-CM

## 2021-11-05 DIAGNOSIS — F4321 Adjustment disorder with depressed mood: Secondary | ICD-10-CM

## 2021-11-05 DIAGNOSIS — Z8639 Personal history of other endocrine, nutritional and metabolic disease: Secondary | ICD-10-CM | POA: Diagnosis not present

## 2021-11-05 DIAGNOSIS — R197 Diarrhea, unspecified: Secondary | ICD-10-CM | POA: Diagnosis not present

## 2021-11-05 DIAGNOSIS — I10 Essential (primary) hypertension: Secondary | ICD-10-CM

## 2021-11-05 DIAGNOSIS — S92909D Unspecified fracture of unspecified foot, subsequent encounter for fracture with routine healing: Secondary | ICD-10-CM

## 2021-11-05 DIAGNOSIS — R739 Hyperglycemia, unspecified: Secondary | ICD-10-CM

## 2021-11-05 DIAGNOSIS — E039 Hypothyroidism, unspecified: Secondary | ICD-10-CM

## 2021-11-05 DIAGNOSIS — E782 Mixed hyperlipidemia: Secondary | ICD-10-CM

## 2021-11-05 DIAGNOSIS — E559 Vitamin D deficiency, unspecified: Secondary | ICD-10-CM

## 2021-11-05 MED ORDER — MODERNA COVID-19 BIVAL BOOSTER 50 MCG/0.5ML IM SUSP
INTRAMUSCULAR | 0 refills | Status: DC
  Filled 2021-11-05: qty 0.5, 1d supply, fill #0

## 2021-11-05 MED ORDER — DIPHENOXYLATE-ATROPINE 2.5-0.025 MG PO TABS: 1.0000 | ORAL_TABLET | Freq: Four times a day (QID) | ORAL | 3 refills | Status: DC | PRN
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
## 2021-11-05 NOTE — Assessment & Plan Note (Signed)
Flared recently

## 2021-11-06 DIAGNOSIS — F4321 Adjustment disorder with depressed mood: Secondary | ICD-10-CM | POA: Insufficient documentation

## 2021-11-06 DIAGNOSIS — S92909A Unspecified fracture of unspecified foot, initial encounter for closed fracture: Secondary | ICD-10-CM | POA: Insufficient documentation

## 2021-11-06 NOTE — Assessment & Plan Note (Signed)
She is approaching the one year anniversary of her husband's death so is still struggling with sadness. She does not feel she needs any treatment adjustments at this time but she will let us know if that changes. She is trying to stay busy and manage her grief on a day to day basis

## 2021-11-06 NOTE — Assessment & Plan Note (Signed)
hgba1c acceptable, minimize simple carbs. Increase exercise as tolerated.  

## 2021-11-06 NOTE — Assessment & Plan Note (Signed)
Supplement and monitor 

## 2021-11-06 NOTE — Assessment & Plan Note (Signed)
Monitor and report any concerns. no changes to meds. Encouraged heart healthy diet such as the DASH diet and exercise as tolerated.  

## 2021-11-06 NOTE — Assessment & Plan Note (Signed)
On Levothyroxine, continue to monitor 

## 2021-11-06 NOTE — Assessment & Plan Note (Signed)
Encourage heart healthy diet such as MIND or DASH diet, increase exercise, avoid trans fats, simple carbohydrates and processed foods, consider a krill or fish or flaxseed oil cap daily. Recheck labs

## 2021-11-06 NOTE — Assessment & Plan Note (Signed)
Left foot 5th metatarsal on 09/22/21 is healing well in an immobility boot. Is following with [facility]

## 2021-11-09 ENCOUNTER — Other Ambulatory Visit: Payer: Self-pay | Admitting: Family Medicine

## 2021-11-15 ENCOUNTER — Other Ambulatory Visit: Payer: Self-pay | Admitting: Family Medicine

## 2021-12-12 ENCOUNTER — Other Ambulatory Visit: Payer: Self-pay | Admitting: Family Medicine

## 2021-12-26 ENCOUNTER — Ambulatory Visit (HOSPITAL_BASED_OUTPATIENT_CLINIC_OR_DEPARTMENT_OTHER)
Admission: RE | Admit: 2021-12-26 | Discharge: 2021-12-26 | Disposition: A | Discharge status: Home / Self Care | Payer: 59 | Source: Ambulatory Visit | Attending: Family Medicine | Admitting: Family Medicine

## 2021-12-26 ENCOUNTER — Other Ambulatory Visit: Payer: Self-pay

## 2021-12-26 DIAGNOSIS — Z780 Asymptomatic menopausal state: Secondary | ICD-10-CM | POA: Diagnosis present

## 2021-12-26 DIAGNOSIS — Z8639 Personal history of other endocrine, nutritional and metabolic disease: Secondary | ICD-10-CM | POA: Insufficient documentation

## 2021-12-28 ENCOUNTER — Other Ambulatory Visit: Payer: Self-pay | Admitting: Family Medicine

## 2022-01-07 ENCOUNTER — Other Ambulatory Visit (INDEPENDENT_AMBULATORY_CARE_PROVIDER_SITE_OTHER): Payer: 59

## 2022-01-07 DIAGNOSIS — E782 Mixed hyperlipidemia: Secondary | ICD-10-CM | POA: Diagnosis not present

## 2022-01-07 DIAGNOSIS — E559 Vitamin D deficiency, unspecified: Secondary | ICD-10-CM

## 2022-01-07 DIAGNOSIS — I10 Essential (primary) hypertension: Secondary | ICD-10-CM | POA: Diagnosis not present

## 2022-01-07 DIAGNOSIS — R739 Hyperglycemia, unspecified: Secondary | ICD-10-CM

## 2022-01-07 LAB — TSH: TSH: 2.57 u[IU]/mL (ref 0.35–5.50)

## 2022-01-07 LAB — CBC WITH DIFFERENTIAL/PLATELET
Basophils Absolute: 0 10*3/uL (ref 0.0–0.1)
Basophils Relative: 0.5 % (ref 0.0–3.0)
Eosinophils Absolute: 0.1 10*3/uL (ref 0.0–0.7)
Eosinophils Relative: 2.1 % (ref 0.0–5.0)
HCT: 40.6 % (ref 36.0–46.0)
Hemoglobin: 13.4 g/dL (ref 12.0–15.0)
Lymphocytes Relative: 23.9 % (ref 12.0–46.0)
Lymphs Abs: 1.6 10*3/uL (ref 0.7–4.0)
MCHC: 33 g/dL (ref 30.0–36.0)
MCV: 90.9 fl (ref 78.0–100.0)
Monocytes Absolute: 0.5 10*3/uL (ref 0.1–1.0)
Monocytes Relative: 7 % (ref 3.0–12.0)
Neutro Abs: 4.4 10*3/uL (ref 1.4–7.7)
Neutrophils Relative %: 66.5 % (ref 43.0–77.0)
Platelets: 285 10*3/uL (ref 150.0–400.0)
RBC: 4.47 Mil/uL (ref 3.87–5.11)
RDW: 13 % (ref 11.5–15.5)
WBC: 6.7 10*3/uL (ref 4.0–10.5)

## 2022-01-07 LAB — COMPREHENSIVE METABOLIC PANEL
ALT: 19 U/L (ref 0–35)
AST: 18 U/L (ref 0–37)
Albumin: 4.4 g/dL (ref 3.5–5.2)
Alkaline Phosphatase: 81 U/L (ref 39–117)
BUN: 11 mg/dL (ref 6–23)
CO2: 27 mEq/L (ref 19–32)
Calcium: 9 mg/dL (ref 8.4–10.5)
Chloride: 104 mEq/L (ref 96–112)
Creatinine, Ser: 0.61 mg/dL (ref 0.40–1.20)
GFR: 96.24 mL/min (ref >60.00)
Glucose, Bld: 82 mg/dL (ref 70–99)
Potassium: 4.6 mEq/L (ref 3.5–5.1)
Sodium: 140 mEq/L (ref 135–145)
Total Bilirubin: 0.4 mg/dL (ref 0.2–1.2)
Total Protein: 7.3 g/dL (ref 6.0–8.3)

## 2022-01-07 LAB — LIPID PANEL
Cholesterol: 182 mg/dL (ref 0–200)
HDL: 66.5 mg/dL (ref >39.00)
NonHDL: 115.73
Total CHOL/HDL Ratio: 3
Triglycerides: 204 mg/dL — ABNORMAL HIGH (ref 0.0–149.0)
VLDL: 40.8 mg/dL — ABNORMAL HIGH (ref 0.0–40.0)

## 2022-01-07 LAB — HEMOGLOBIN A1C: Hgb A1c MFr Bld: 5.9 % (ref 4.6–6.5)

## 2022-01-07 LAB — LDL CHOLESTEROL, DIRECT: Direct LDL: 87 mg/dL

## 2022-01-10 LAB — VITAMIN D 1,25 DIHYDROXY
Vitamin D 1, 25 (OH)2 Total: 58 pg/mL (ref 18–72)
Vitamin D2 1, 25 (OH)2: 31 pg/mL
Vitamin D3 1, 25 (OH)2: 27 pg/mL

## 2022-01-12 ENCOUNTER — Other Ambulatory Visit: Payer: Self-pay | Admitting: Family Medicine

## 2022-02-01 ENCOUNTER — Other Ambulatory Visit: Payer: Self-pay | Admitting: Family Medicine

## 2022-03-11 ENCOUNTER — Other Ambulatory Visit: Payer: Self-pay | Admitting: Family Medicine

## 2022-03-11 ENCOUNTER — Other Ambulatory Visit: Payer: Self-pay

## 2022-03-11 MED ORDER — DIPHENOXYLATE-ATROPINE 2.5-0.025 MG PO TABS: ORAL_TABLET | ORAL | 1 refills | Status: DC

## 2022-03-11 NOTE — Telephone Encounter (Signed)
Can you send this in for me? It's not letting me send it ?

## 2022-03-11 NOTE — Progress Notes (Signed)
error 

## 2022-03-18 ENCOUNTER — Other Ambulatory Visit: Payer: Self-pay | Admitting: Family Medicine

## 2022-03-18 ENCOUNTER — Encounter: Payer: Self-pay | Admitting: Family Medicine

## 2022-03-18 MED ORDER — AMITRIPTYLINE HCL 25 MG PO TABS: 25.0000 mg | ORAL_TABLET | Freq: Every evening | ORAL | 1 refills | Status: DC | PRN

## 2022-03-18 MED ORDER — ZOLPIDEM TARTRATE 10 MG PO TABS
5.0000 mg | ORAL_TABLET | Freq: Every evening | ORAL | 1 refills | Status: DC | PRN
Start: 2022-03-18 — End: 2022-05-15

## 2022-03-19 ENCOUNTER — Other Ambulatory Visit: Payer: Self-pay | Admitting: Family Medicine

## 2022-04-03 ENCOUNTER — Other Ambulatory Visit: Payer: Self-pay | Admitting: Family Medicine

## 2022-05-04 ENCOUNTER — Other Ambulatory Visit: Payer: Self-pay | Admitting: Family Medicine

## 2022-05-05 NOTE — Telephone Encounter (Signed)
Requesting: Lomotil ?Contract: will get at next visit ?UDS: will get at next visit. ?Last Visit: 11/05/21 ?Next Visit: 05/15/22 ?Last Refill: 03/11/22 ? ?Please Advise ? ?

## 2022-05-07 ENCOUNTER — Other Ambulatory Visit: Payer: Self-pay | Admitting: Family Medicine

## 2022-05-14 ENCOUNTER — Other Ambulatory Visit: Payer: Self-pay | Admitting: Family Medicine

## 2022-05-14 NOTE — Progress Notes (Unsigned)
Subjective:    Patient ID: Kathy Huerta, female    DOB: 29-Jun-1961, 61 y.o.   MRN: [id number]  No chief complaint on file.   HPI Patient is in today for her annual physical exam.  Past Medical History:  Diagnosis Date   Anxiety    Arthritis of right hip 10/28/2016   Basal cell carcinoma 11/17/11   Dr. Sammuel Hines   Cutaneous skin tags 07/24/2013   DDD (degenerative disc disease), cervical    s/p fusion   DDD (degenerative disc disease), thoracolumbar    on disability   Depression    Headache(784.0) 01/29/2013   Hearing loss    Saw ENT on 04/27/2017; Moderate hearing loss in both ears.   Heartburn    Hiatal hernia    HTN (hypertension) 2002   Hyperlipemia 08/2014   Hyperplastic colon polyp    Hypothyroid    Internal hemorrhoids    Obesity    Obesity 04/30/2017   Pain in joint, shoulder region 11/20/2013   Pancreatitis 04/24/2011   Pneumonia 07/24/2013   Preventative health care 10/28/2016   Tachycardia 08/12/2014   Vitamin D deficiency 11/18/2015    Past Surgical History:  Procedure Laterality Date   ANTERIOR FUSION CERVICAL SPINE  2008   C6-7  Dr Saintclair Halsted   CHOLECYSTECTOMY N/A 02/27/2013   Procedure: LAPAROSCOPIC CHOLECYSTECTOMY WITH INTRAOPERATIVE CHOLANGIOGRAM;  Surgeon: Adin Hector, MD;  Location: [facility];  Service: General;  Laterality: N/A;   POSTERIOR FUSION CERVICAL SPINE  2011   C6-7    Family History  Problem Relation Age of Onset   Hypertension Mother    Hyperlipidemia Mother    Other Father        DDD, colectomy,cardiac stent, afib   Colon polyps Father    Hypertension Brother    Diabetes Maternal Grandmother    Colon cancer Neg Hx    Stomach cancer Neg Hx    Kidney disease Neg Hx     Social History   Socioeconomic History   Marital status: Widowed    Spouse name: Not on file   Number of children: 0   Years of education: Not on file   Highest education level: Not on file  Occupational History   Occupation: Disabled    Employer: UNEMPLOYED   Tobacco Use   Smoking status: Never   Smokeless tobacco: Never  Vaping Use   Vaping Use: Never used  Substance and Sexual Activity   Alcohol use: Yes    Alcohol/week: 5.0 standard drinks    Types: 5 Cans of beer per week   Drug use: No   Sexual activity: Not on file  Other Topics Concern   Not on file  Social History Narrative   Reviewed history and no changes required.   Occupation: Disabled,  has worked in the past for a [employer] (medical))   Married   Alcohol use-yes- every weekend 4-5 beers.   Regular exercise-no   Smoking Status:  quit   Caffeine use/day:  none   Does Patient Exercise:  no         Social Determinants of Radio broadcast assistant Strain: Not on file  Food Insecurity: Not on file  Transportation Needs: Not on file  Physical Activity: Not on file  Stress: Not on file  Social Connections: Not on file  Intimate Partner Violence: Not on file    Outpatient Medications Prior to Visit  Medication Sig Dispense Refill   amitriptyline (ELAVIL) 25 MG tablet TAKE  1 TO 2 TABLETS BY MOUTH AT BEDTIME AS NEEDED FOR SLEEP 60 tablet 1   atorvastatin (LIPITOR) 40 MG tablet TAKE ONE TABLET BY MOUTH DAILY 90 tablet 1   COVID-19 mRNA bivalent vaccine, Moderna, (MODERNA COVID-19 BIVAL BOOSTER) 50 MCG/0.5ML injection Inject into the muscle. 0.5 mL 0   COVID-19 mRNA vaccine, Moderna, (MODERNA COVID-19 VACCINE) 100 MCG/0.5ML injection Inject into the muscle. 0.25 mL 0   diclofenac sodium (VOLTAREN) 1 % GEL Apply 4 g topically 4 (four) times daily as needed. 300 g 5   dicyclomine (BENTYL) 10 MG capsule TAKE ONE TO TWO CAPSULES BY MOUTH EVERY 6 HOURS AS NEEDED FOR SPASMS 120 capsule 2   diphenoxylate-atropine (LOMOTIL) 2.5-0.025 MG tablet TAKE 1 TO 2 TABLETS BY MOUTH FOUR TIMES DAILY AS NEEDED FOR DIARRHEA OR LOOSE STOOLS *MAX OF 8 TABLETS IN 24 HOURS* 30 tablet 2   levothyroxine (SYNTHROID) 50 MCG tablet TAKE ONE TABLET BY MOUTH DAILY BEFORE BREAKFAST 30 tablet  2   losartan (COZAAR) 25 MG tablet TAKE ONE TABLET BY MOUTH DAILY 30 tablet 3   pantoprazole (PROTONIX) 40 MG tablet TAKE ONE TABLET BY MOUTH DAILY **MUST CALL MD FOR APPOINTMENT 30 tablet 0   propranolol (INDERAL) 40 MG tablet TAKE ONE TABLET BY MOUTH TWICE A DAY 180 tablet 0   sertraline (ZOLOFT) 100 MG tablet TAKE TWO TABLETS BY MOUTH DAILY 60 tablet 0   Vitamin D, Ergocalciferol, (DRISDOL) 1.25 MG (50000 UNIT) CAPS capsule TAKE 1 CAPSULE BY MOUTH EVERY 7 DAYS 4 capsule 4   zolpidem (AMBIEN) 10 MG tablet Take 0.5-1 tablets (5-10 mg total) by mouth at bedtime as needed for sleep. 15 tablet 1   No facility-administered medications prior to visit.    Allergies  Allergen Reactions   Amoxicillin-Pot Clavulanate     REACTION: Throat swelling.   Azithromycin     REACTION: Throat Swelling   Cefazolin     REACTION: Throat Swelling   Moxifloxacin     REACTION: Throat Swelling   Penicillins     REACTION: throat swelling   Sulfonamide Derivatives     ROS     Objective:    Physical Exam  LMP 09/23/2011  Wt Readings from Last 3 Encounters:  04/12/21 203 lb (92.1 kg)  02/21/20 198 lb (89.8 kg)  12/29/19 197 lb (89.4 kg)    Diabetic Foot Exam - Simple   No data filed    Lab Results  Component Value Date   WBC 6.7 01/07/2022   HGB 13.4 01/07/2022   HCT 40.6 01/07/2022   PLT 285.0 01/07/2022   GLUCOSE 82 01/07/2022   CHOL 182 01/07/2022   TRIG 204.0 (H) 01/07/2022   HDL 66.50 01/07/2022   LDLDIRECT 87.0 01/07/2022   LDLCALC 83 08/06/2021   ALT 19 01/07/2022   AST 18 01/07/2022   NA 140 01/07/2022   K 4.6 01/07/2022   CL 104 01/07/2022   CREATININE 0.61 01/07/2022   BUN 11 01/07/2022   CO2 27 01/07/2022   TSH 2.57 01/07/2022   HGBA1C 5.9 01/07/2022    Lab Results  Component Value Date   TSH 2.57 01/07/2022   Lab Results  Component Value Date   WBC 6.7 01/07/2022   HGB 13.4 01/07/2022   HCT 40.6 01/07/2022   MCV 90.9 01/07/2022   PLT 285.0 01/07/2022    Lab Results  Component Value Date   NA 140 01/07/2022   K 4.6 01/07/2022   CO2 27 01/07/2022   GLUCOSE 82 01/07/2022  BUN 11 01/07/2022   CREATININE 0.61 01/07/2022   BILITOT 0.4 01/07/2022   ALKPHOS 81 01/07/2022   AST 18 01/07/2022   ALT 19 01/07/2022   PROT 7.3 01/07/2022   ALBUMIN 4.4 01/07/2022   CALCIUM 9.0 01/07/2022   GFR 96.24 01/07/2022   Lab Results  Component Value Date   CHOL 182 01/07/2022   Lab Results  Component Value Date   HDL 66.50 01/07/2022   Lab Results  Component Value Date   LDLCALC 83 08/06/2021   Lab Results  Component Value Date   TRIG 204.0 (H) 01/07/2022   Lab Results  Component Value Date   CHOLHDL 3 01/07/2022   Lab Results  Component Value Date   HGBA1C 5.9 01/07/2022       Assessment & Plan:   Problem List Items Addressed This Visit   None   I am having Kathy Huerta maintain her diclofenac sodium, dicyclomine, Moderna COVID-19 Vaccine, Moderna COVID-19 Bival Booster, Vitamin D (Ergocalciferol), atorvastatin, zolpidem, losartan, propranolol, pantoprazole, diphenoxylate-atropine, levothyroxine, amitriptyline, and sertraline.  No orders of the defined types were placed in this encounter.

## 2022-05-15 ENCOUNTER — Encounter: Payer: Self-pay | Admitting: Family Medicine

## 2022-05-15 ENCOUNTER — Ambulatory Visit (INDEPENDENT_AMBULATORY_CARE_PROVIDER_SITE_OTHER): Payer: 59 | Admitting: Family Medicine

## 2022-05-15 VITALS — BP 128/80 | HR 74 | Resp 20 | Ht 67.0 in | Wt 208.0 lb

## 2022-05-15 DIAGNOSIS — I10 Essential (primary) hypertension: Secondary | ICD-10-CM

## 2022-05-15 DIAGNOSIS — E782 Mixed hyperlipidemia: Secondary | ICD-10-CM

## 2022-05-15 DIAGNOSIS — E559 Vitamin D deficiency, unspecified: Secondary | ICD-10-CM

## 2022-05-15 DIAGNOSIS — E039 Hypothyroidism, unspecified: Secondary | ICD-10-CM | POA: Diagnosis not present

## 2022-05-15 DIAGNOSIS — Z23 Encounter for immunization: Secondary | ICD-10-CM

## 2022-05-15 DIAGNOSIS — R739 Hyperglycemia, unspecified: Secondary | ICD-10-CM

## 2022-05-15 DIAGNOSIS — Z0000 Encounter for general adult medical examination without abnormal findings: Secondary | ICD-10-CM

## 2022-05-15 DIAGNOSIS — F3341 Major depressive disorder, recurrent, in partial remission: Secondary | ICD-10-CM

## 2022-05-15 MED ORDER — SERTRALINE HCL 100 MG PO TABS: 200.0000 mg | ORAL_TABLET | Freq: Every morning | ORAL | 5 refills | Status: DC

## 2022-05-15 MED ORDER — ALPRAZOLAM 0.25 MG PO TABS: 0.2500 mg | ORAL_TABLET | Freq: Two times a day (BID) | ORAL | 1 refills | Status: DC | PRN

## 2022-05-15 MED ORDER — OLANZAPINE 5 MG PO TABS: 5.0000 mg | ORAL_TABLET | Freq: Every day | ORAL | 3 refills | Status: DC

## 2022-05-15 MED ORDER — ZOLPIDEM TARTRATE 10 MG PO TABS: ORAL_TABLET | ORAL | 3 refills | Status: DC

## 2022-05-15 NOTE — Patient Instructions (Signed)
Preventive Care 40-62 Years Old, Female Preventive care refers to lifestyle choices and visits with your health care provider that can promote health and wellness. Preventive care visits are also called wellness exams. What can I expect for my preventive care visit? Counseling Your health care provider may ask you questions about your: Medical history, including: Past medical problems. Family medical history. Pregnancy history. Current health, including: Menstrual cycle. Method of birth control. Emotional well-being. Home life and relationship well-being. Sexual activity and sexual health. Lifestyle, including: Alcohol, nicotine or tobacco, and drug use. Access to firearms. Diet, exercise, and sleep habits. Work and work environment. Sunscreen use. Safety issues such as seatbelt and bike helmet use. Physical exam Your health care provider will check your: Height and weight. These may be used to calculate your BMI (body mass index). BMI is a measurement that tells if you are at a healthy weight. Waist circumference. This measures the distance around your waistline. This measurement also tells if you are at a healthy weight and may help predict your risk of certain diseases, such as type 2 diabetes and high blood pressure. Heart rate and blood pressure. Body temperature. Skin for abnormal spots. What immunizations do I need?  Vaccines are usually given at various ages, according to a schedule. Your health care provider will recommend vaccines for you based on your age, medical history, and lifestyle or other factors, such as travel or where you work. What tests do I need? Screening Your health care provider may recommend screening tests for certain conditions. This may include: Lipid and cholesterol levels. Diabetes screening. This is done by checking your blood sugar (glucose) after you have not eaten for a while (fasting). Pelvic exam and Pap test. Hepatitis B test. Hepatitis C  test. HIV (human immunodeficiency virus) test. STI (sexually transmitted infection) testing, if you are at risk. Lung cancer screening. Colorectal cancer screening. Mammogram. Talk with your health care provider about when you should start having regular mammograms. This may depend on whether you have a family history of breast cancer. BRCA-related cancer screening. This may be done if you have a family history of breast, ovarian, tubal, or peritoneal cancers. Bone density scan. This is done to screen for osteoporosis. Talk with your health care provider about your test results, treatment options, and if necessary, the need for more tests. Follow these instructions at home: Eating and drinking  Eat a diet that includes fresh fruits and vegetables, whole grains, lean protein, and low-fat dairy products. Take vitamin and mineral supplements as recommended by your health care provider. Do not drink alcohol if: Your health care provider tells you not to drink. You are pregnant, may be pregnant, or are planning to become pregnant. If you drink alcohol: Limit how much you have to 0-1 drink a day. Know how much alcohol is in your drink. In the [place] one drink equals one 12 oz bottle of beer (355 mL), one 5 oz glass of wine (148 mL), or one 1 oz glass of hard liquor (44 mL). Lifestyle Brush your teeth every morning and night with fluoride toothpaste. Floss one time each day. Exercise for at least 30 minutes 5 or more days each week. Do not use any products that contain nicotine or tobacco. These products include cigarettes, chewing tobacco, and vaping devices, such as e-cigarettes. If you need help quitting, ask your health care provider. Do not use drugs. If you are sexually active, practice safe sex. Use a condom or other form of protection to   prevent STIs. If you do not wish to become pregnant, use a form of birth control. If you plan to become pregnant, see your health care provider for a  prepregnancy visit. Take aspirin only as told by your health care provider. Make sure that you understand how much to take and what form to take. Work with your health care provider to find out whether it is safe and beneficial for you to take aspirin daily. Find healthy ways to manage stress, such as: Meditation, yoga, or listening to music. Journaling. Talking to a trusted person. Spending time with friends and family. Minimize exposure to UV radiation to reduce your risk of skin cancer. Safety Always wear your seat belt while driving or riding in a vehicle. Do not drive: If you have been drinking alcohol. Do not ride with someone who has been drinking. When you are tired or distracted. While texting. If you have been using any mind-altering substances or drugs. Wear a helmet and other protective equipment during sports activities. If you have firearms in your house, make sure you follow all gun safety procedures. Seek help if you have been physically or sexually abused. What's next? Visit your health care provider once a year for an annual wellness visit. Ask your health care provider how often you should have your eyes and teeth checked. Stay up to date on all vaccines. This information is not intended to replace advice given to you by your health care provider. Make sure you discuss any questions you have with your health care provider. Document Revised: 06/05/2021 Document Reviewed: 06/05/2021 Elsevier Patient Education  [place].

## 2022-05-15 NOTE — Progress Notes (Signed)
Subjective:   By signing my name below, I, Zite Okoli, attest that this documentation has been prepared under the direction and in the presence of Mosie Lukes, MD. 05/15/2022      Patient ID: Kathy Huerta, female    DOB: 04-20-1961, 61 y.o.   MRN: [id number]  Chief Complaint  Patient presents with   Annual Exam    HPI Patient is in today for a comprehensive physical exam.  She recently lost her father last week. She is still dealing with the grief after her husband's death. She is not sleeping and feels like she is having panic attacks and cannot take a deep breath. She is currently on 100 mg Zoloft and is requesting for a refill on 10 mg ambien.   She denies fever, congestion, eye pain, chest pain, palpitations, leg swelling, shortness of breath, nausea, abdominal pain, diarrhea and blood in stool. Also denies dysuria, frequency, back pain and headaches.   She will receive the tetanus vaccine today. She is UTD on shingles and pneumonia vaccines. She has 5 Covid-19 vaccines at this time.  Past Medical History:  Diagnosis Date   Allergy 2010   Anxiety    Arthritis of right hip 10/28/2016   Basal cell carcinoma 11/17/2011   Dr. Sammuel Hines   Cataract 2010   Cutaneous skin tags 07/24/2013   DDD (degenerative disc disease), cervical    s/p fusion   DDD (degenerative disc disease), thoracolumbar    on disability   Depression    Headache(784.0) 01/29/2013   Hearing loss    Saw ENT on 04/27/2017; Moderate hearing loss in both ears.   Heartburn    Hiatal hernia    HTN (hypertension) 2002   Hyperlipemia 08/2014   Hyperplastic colon polyp    Hypothyroid    Internal hemorrhoids    Obesity    Obesity 04/30/2017   Pain in joint, shoulder region 11/20/2013   Pancreatitis 04/24/2011   Pneumonia 07/24/2013   Preventative health care 10/28/2016   Tachycardia 08/12/2014   Vitamin D deficiency 11/18/2015    Past Surgical History:  Procedure Laterality Date   ANTERIOR FUSION  CERVICAL SPINE  12/22/2006   C6-7  Dr Saintclair Halsted   CHOLECYSTECTOMY N/A 02/27/2013   Procedure: LAPAROSCOPIC CHOLECYSTECTOMY WITH INTRAOPERATIVE CHOLANGIOGRAM;  Surgeon: Adin Hector, MD;  Location: [facility];  Service: General;  Laterality: N/A;   POSTERIOR FUSION CERVICAL SPINE  12/22/2009   C6-7   SPINE SURGERY  2009    Family History  Problem Relation Age of Onset   Hypertension Mother    Hyperlipidemia Mother    Arthritis Mother    Hearing loss Mother    Other Father        DDD, colectomy,cardiac stent, afib   Colon polyps Father    Anxiety disorder Father    COPD Father        died due to complications from [id number]   Depression Father    Diabetes Father    Heart disease Father    Hypertension Brother    Diabetes Maternal Grandmother    Diabetes Paternal Grandmother    Colon cancer Neg Hx    Stomach cancer Neg Hx    Kidney disease Neg Hx     Social History   Socioeconomic History   Marital status: Widowed    Spouse name: Not on file   Number of children: 0   Years of education: Not on file   Highest education level: Not on file  Occupational  History   Occupation: Disabled    Fish farm manager: UNEMPLOYED  Tobacco Use   Smoking status: Never   Smokeless tobacco: Never  Vaping Use   Vaping Use: Never used  Substance and Sexual Activity   Alcohol use: Yes    Alcohol/week: 5.0 standard drinks    Types: 5 Cans of beer per week   Drug use: No   Sexual activity: Not Currently  Other Topics Concern   Not on file  Social History Narrative   Reviewed history and no changes required.   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   Regular exercise-no   Smoking Status:  quit   Caffeine use/day:  none   Does Patient Exercise:  no         Social Determinants of Radio broadcast assistant Strain: Not on file  Food Insecurity: Not on file  Transportation Needs: Not on file   Physical Activity: Not on file  Stress: Not on file  Social Connections: Not on file  Intimate Partner Violence: Not on file    Outpatient Medications Prior to Visit  Medication Sig Dispense Refill   atorvastatin (LIPITOR) 40 MG tablet TAKE ONE TABLET BY MOUTH DAILY 90 tablet 1   COVID-19 mRNA vaccine, Moderna, (MODERNA COVID-19 VACCINE) 100 MCG/0.5ML injection Inject into the muscle. 0.25 mL 0   diclofenac sodium (VOLTAREN) 1 % GEL Apply 4 g topically 4 (four) times daily as needed. 300 g 5   dicyclomine (BENTYL) 10 MG capsule TAKE ONE TO TWO CAPSULES BY MOUTH EVERY 6 HOURS AS NEEDED FOR SPASMS 120 capsule 2   diphenoxylate-atropine (LOMOTIL) 2.5-0.025 MG tablet TAKE 1 TO 2 TABLETS BY MOUTH FOUR TIMES DAILY AS NEEDED FOR DIARRHEA OR LOOSE STOOLS *MAX OF 8 TABLETS IN 24 HOURS* 30 tablet 2   levothyroxine (SYNTHROID) 50 MCG tablet TAKE ONE TABLET BY MOUTH DAILY BEFORE BREAKFAST 30 tablet 2   losartan (COZAAR) 25 MG tablet TAKE ONE TABLET BY MOUTH DAILY 30 tablet 3   pantoprazole (PROTONIX) 40 MG tablet TAKE ONE TABLET BY MOUTH DAILY **MUST CALL MD FOR APPOINTMENT 30 tablet 0   propranolol (INDERAL) 40 MG tablet TAKE ONE TABLET BY MOUTH TWICE A DAY 180 tablet 0   Vitamin D, Ergocalciferol, (DRISDOL) 1.25 MG (50000 UNIT) CAPS capsule TAKE 1 CAPSULE BY MOUTH EVERY 7 DAYS 4 capsule 4   amitriptyline (ELAVIL) 25 MG tablet TAKE 1 TO 2 TABLETS BY MOUTH AT BEDTIME AS NEEDED FOR SLEEP 60 tablet 1   COVID-19 mRNA bivalent vaccine, Moderna, (MODERNA COVID-19 BIVAL BOOSTER) 50 MCG/0.5ML injection Inject into the muscle. 0.5 mL 0   sertraline (ZOLOFT) 100 MG tablet TAKE TWO TABLETS BY MOUTH DAILY 60 tablet 0   zolpidem (AMBIEN) 10 MG tablet Take 0.5-1 tablets (5-10 mg total) by mouth at bedtime as needed for sleep. 15 tablet 1   No facility-administered medications prior to visit.    Allergies  Allergen Reactions   Amoxicillin-Pot Clavulanate     REACTION: Throat swelling.   Azithromycin      REACTION: Throat Swelling   Cefazolin     REACTION: Throat Swelling   Moxifloxacin     REACTION: Throat Swelling   Penicillins     REACTION: throat swelling   Sulfonamide Derivatives     Review of Systems  Constitutional:  Negative for fever.  HENT:  Negative for congestion.   Eyes:  Negative  for pain.  Respiratory:  Negative for shortness of breath.   Cardiovascular:  Negative for chest pain, palpitations and leg swelling.  Gastrointestinal:  Negative for abdominal pain, blood in stool, diarrhea and nausea.  Genitourinary:  Negative for dysuria and frequency.  Musculoskeletal:  Negative for back pain.  Neurological:  Negative for headaches.  Psychiatric/Behavioral:  Positive for depression. The patient is nervous/anxious and has insomnia.       Objective:    Physical Exam Constitutional:      General: She is not in acute distress.    Appearance: She is well-developed.  HENT:     Head: Normocephalic and atraumatic.     Right Ear: Tympanic membrane, ear canal and external ear normal.     Left Ear: Tympanic membrane, ear canal and external ear normal.  Eyes:     Conjunctiva/sclera: Conjunctivae normal.  Neck:     Thyroid: No thyromegaly.  Cardiovascular:     Rate and Rhythm: Normal rate and regular rhythm.     Heart sounds: Normal heart sounds. No murmur heard. Pulmonary:     Effort: Pulmonary effort is normal. No respiratory distress.     Breath sounds: Normal breath sounds.  Abdominal:     General: Bowel sounds are normal. There is no distension.     Palpations: Abdomen is soft. There is no mass.     Tenderness: There is no abdominal tenderness.  Musculoskeletal:     Cervical back: Neck supple.  Lymphadenopathy:     Cervical: No cervical adenopathy.  Skin:    General: Skin is warm and dry.  Neurological:     Mental Status: She is alert and oriented to person, place, and time.  Psychiatric:        Behavior: Behavior normal.    BP 128/80 (BP Location: Left  Arm, Patient Position: Sitting, Cuff Size: Normal)   Pulse 74   Resp 20   Ht '5\' 7"'$  (1.702 m)   Wt 208 lb (94.3 kg)   LMP 09/23/2011   SpO2 98%   BMI 32.58 kg/m  Wt Readings from Last 3 Encounters:  05/15/22 208 lb (94.3 kg)  04/12/21 203 lb (92.1 kg)  02/21/20 198 lb (89.8 kg)    Diabetic Foot Exam - Simple   No data filed    Lab Results  Component Value Date   WBC 6.7 01/07/2022   HGB 13.4 01/07/2022   HCT 40.6 01/07/2022   PLT 285.0 01/07/2022   GLUCOSE 82 01/07/2022   CHOL 182 01/07/2022   TRIG 204.0 (H) 01/07/2022   HDL 66.50 01/07/2022   LDLDIRECT 87.0 01/07/2022   LDLCALC 83 08/06/2021   ALT 19 01/07/2022   AST 18 01/07/2022   NA 140 01/07/2022   K 4.6 01/07/2022   CL 104 01/07/2022   CREATININE 0.61 01/07/2022   BUN 11 01/07/2022   CO2 27 01/07/2022   TSH 2.57 01/07/2022   HGBA1C 5.9 01/07/2022    Lab Results  Component Value Date   TSH 2.57 01/07/2022   Lab Results  Component Value Date   WBC 6.7 01/07/2022   HGB 13.4 01/07/2022   HCT 40.6 01/07/2022   MCV 90.9 01/07/2022   PLT 285.0 01/07/2022   Lab Results  Component Value Date   NA 140 01/07/2022   K 4.6 01/07/2022   CO2 27 01/07/2022   GLUCOSE 82 01/07/2022   BUN 11 01/07/2022   CREATININE 0.61 01/07/2022   BILITOT 0.4 01/07/2022   ALKPHOS 81 01/07/2022   AST  18 01/07/2022   ALT 19 01/07/2022   PROT 7.3 01/07/2022   ALBUMIN 4.4 01/07/2022   CALCIUM 9.0 01/07/2022   GFR 96.24 01/07/2022   Lab Results  Component Value Date   CHOL 182 01/07/2022   Lab Results  Component Value Date   HDL 66.50 01/07/2022   Lab Results  Component Value Date   LDLCALC 83 08/06/2021   Lab Results  Component Value Date   TRIG 204.0 (H) 01/07/2022   Lab Results  Component Value Date   CHOLHDL 3 01/07/2022   Lab Results  Component Value Date   HGBA1C 5.9 01/07/2022       Mammogram- Last checked on 05/27/2021. Results were normal. Repeat in 1 year. Pap smear- Last checked on  04/08/2016. Results were normal. Repeat in 3-5 years Dexa- Last checked on 12/26/2021. Patient was considered normal. Repeat in 2 years. Colonoscopy- Last completed on 10/28/2019. There were hemorrhoids found on perianal exam. Polyps were found, resected and retrieved. Repeat in 3 years.  Assessment & Plan:   Problem List Items Addressed This Visit     Hypertension    Well controlled, no changes to meds. Encouraged heart healthy diet such as the DASH diet and exercise as tolerated.        Hyperlipidemia    Tolerating statin, encouraged heart healthy diet, avoid trans fats, minimize simple carbs and saturated fats. Increase exercise as tolerated       Depression    Continues to struggle will continue Sertraline add Alprazolam to use prn sparingly for Anxiety attacks. Add Zyprexa 5 mg qhs. Reevaluate in 3 months or sooner as needed.       Relevant Medications   ALPRAZolam (XANAX) 0.25 MG tablet   sertraline (ZOLOFT) 100 MG tablet   Vitamin D deficiency    Supplement and monitor       Preventative health care - Primary    Patient encouraged to maintain heart healthy diet, regular exercise, adequate sleep. Consider daily probiotics. Take medications as prescribed. Labs ordered and reviewed.Pap smear- Last checked on 04/08/2016. Results were normal. Repeat in 3-5 years Dexa- Last checked on 12/26/2021. Patient was considered normal. Repeat in 2 years. Colonoscopy- Last completed on 10/28/2019. There were hemorrhoids found on perianal exam. Polyps were found, resected and retrieved. Repeat in 3 years. Given Td today      Hyperglycemia    hgba1c acceptable, minimize simple carbs. Increase exercise as tolerated.        Hypothyroidism    On Levothyroxine, continue to monitor       Other Visit Diagnoses     Need for Td vaccine       Relevant Orders   Td vaccine greater than or equal to 7yo preservative free IM (Completed)      Meds ordered this encounter  Medications    zolpidem (AMBIEN) 10 MG tablet    Sig: 1/2 to 1 tab po qhs prn insomnia    Dispense:  15 tablet    Refill:  3   OLANZapine (ZYPREXA) 5 MG tablet    Sig: Take 1 tablet (5 mg total) by mouth at bedtime.    Dispense:  30 tablet    Refill:  3   ALPRAZolam (XANAX) 0.25 MG tablet    Sig: Take 1-2 tablets (0.25-0.5 mg total) by mouth 2 (two) times daily as needed for anxiety.    Dispense:  40 tablet    Refill:  1   sertraline (ZOLOFT) 100 MG tablet  Sig: Take 2 tablets (200 mg total) by mouth in the morning.    Dispense:  60 tablet    Refill:  5    I,Zite Okoli,acting as a scribe for Penni Homans, MD.,have documented all relevant documentation on the behalf of Penni Homans, MD,as directed by  Penni Homans, MD while in the presence of Penni Homans, MD.   I, Mosie Lukes, MD., personally preformed the services described in this documentation.  All medical record entries made by the scribe were at my direction and in my presence.  I have reviewed the chart and discharge instructions (if applicable) and agree that the record reflects my personal performance and is accurate and complete. 05/15/2022

## 2022-05-19 NOTE — Assessment & Plan Note (Addendum)
Patient encouraged to maintain heart healthy diet, regular exercise, adequate sleep. Consider daily probiotics. Take medications as prescribed. Labs ordered and reviewed.Pap smear- Last checked on 04/08/2016. Results were normal. Repeat in 3-5 years Dexa- Last checked on 12/26/2021. Patient was considered normal. Repeat in 2 years. Colonoscopy- Last completed on 10/28/2019. There were hemorrhoids found on perianal exam. Polyps were found, resected and retrieved. Repeat in 3 years. Given Td today

## 2022-05-19 NOTE — Assessment & Plan Note (Signed)
Supplement and monitor 

## 2022-05-19 NOTE — Assessment & Plan Note (Signed)
hgba1c acceptable, minimize simple carbs. Increase exercise as tolerated.  

## 2022-05-19 NOTE — Assessment & Plan Note (Signed)
On Levothyroxine, continue to monitor 

## 2022-05-19 NOTE — Assessment & Plan Note (Signed)
Tolerating statin, encouraged heart healthy diet, avoid trans fats, minimize simple carbs and saturated fats. Increase exercise as tolerated 

## 2022-05-19 NOTE — Assessment & Plan Note (Signed)
Continues to struggle will continue Sertraline add Alprazolam to use prn sparingly for Anxiety attacks. Add Zyprexa 5 mg qhs. Reevaluate in 3 months or sooner as needed.

## 2022-05-19 NOTE — Assessment & Plan Note (Signed)
Well controlled, no changes to meds. Encouraged heart healthy diet such as the DASH diet and exercise as tolerated.  °

## 2022-05-30 ENCOUNTER — Other Ambulatory Visit: Payer: Self-pay | Admitting: Family Medicine

## 2022-06-01 ENCOUNTER — Other Ambulatory Visit: Payer: Self-pay | Admitting: Family Medicine

## 2022-07-03 ENCOUNTER — Other Ambulatory Visit: Payer: Self-pay | Admitting: Family Medicine

## 2022-07-10 ENCOUNTER — Other Ambulatory Visit: Payer: Self-pay | Admitting: Family Medicine

## 2022-07-13 ENCOUNTER — Other Ambulatory Visit: Payer: Self-pay | Admitting: Family Medicine

## 2022-07-22 ENCOUNTER — Other Ambulatory Visit: Payer: Self-pay | Admitting: Family Medicine

## 2022-07-22 NOTE — Telephone Encounter (Signed)
Requesting: Lomotil  Contract: n/a UDS: n/a Last Visit: 05/15/22 Next Visit: 08/26/22 Last Refill: 05/05/22 #30 and 2RF  Please Advise

## 2022-07-29 ENCOUNTER — Other Ambulatory Visit: Payer: Self-pay | Admitting: Family Medicine

## 2022-08-06 ENCOUNTER — Other Ambulatory Visit: Payer: Self-pay | Admitting: Family Medicine

## 2022-08-09 ENCOUNTER — Other Ambulatory Visit: Payer: Self-pay | Admitting: Family Medicine

## 2022-08-25 NOTE — Assessment & Plan Note (Addendum)
Encouraged good sleep hygiene such as dark, quiet room. No blue/green glowing lights such as computer screens in bedroom. No alcohol or stimulants in evening. Cut down on caffeine as able. Regular exercise is helpful but not just prior to bed time. Doing well on current meds, continue Amitriptyline

## 2022-08-25 NOTE — Assessment & Plan Note (Signed)
On Levothyroxine, continue to monitor 

## 2022-08-25 NOTE — Assessment & Plan Note (Signed)
Well controlled, no changes to meds. Encouraged heart healthy diet such as the DASH diet and exercise as tolerated.  °

## 2022-08-25 NOTE — Assessment & Plan Note (Addendum)
Added Zyprexa at last visit but only took one dose she feels she is doing much better and has not needed the Zyprexa of the Alprazolam. No changes just continue the Amitriptyline and Sertraline

## 2022-08-25 NOTE — Assessment & Plan Note (Signed)
hgba1c acceptable, minimize simple carbs. Increase exercise as tolerated.  

## 2022-08-25 NOTE — Assessment & Plan Note (Signed)
On Propranolol

## 2022-08-25 NOTE — Assessment & Plan Note (Signed)
Supplement and monitor 

## 2022-08-25 NOTE — Assessment & Plan Note (Signed)
Encourage heart healthy diet such as MIND or DASH diet, increase exercise, avoid trans fats, simple carbohydrates and processed foods, consider a krill or fish or flaxseed oil cap daily.  °

## 2022-08-26 ENCOUNTER — Telehealth: Payer: 59 | Admitting: Family Medicine

## 2022-08-26 DIAGNOSIS — E782 Mixed hyperlipidemia: Secondary | ICD-10-CM | POA: Diagnosis not present

## 2022-08-26 DIAGNOSIS — I10 Essential (primary) hypertension: Secondary | ICD-10-CM

## 2022-08-26 DIAGNOSIS — R000 Tachycardia, unspecified: Secondary | ICD-10-CM

## 2022-08-26 DIAGNOSIS — E039 Hypothyroidism, unspecified: Secondary | ICD-10-CM

## 2022-08-26 DIAGNOSIS — G4700 Insomnia, unspecified: Secondary | ICD-10-CM

## 2022-08-26 DIAGNOSIS — R739 Hyperglycemia, unspecified: Secondary | ICD-10-CM

## 2022-08-26 DIAGNOSIS — E559 Vitamin D deficiency, unspecified: Secondary | ICD-10-CM

## 2022-08-26 DIAGNOSIS — F4321 Adjustment disorder with depressed mood: Secondary | ICD-10-CM

## 2022-08-26 NOTE — Progress Notes (Addendum)
Virtual telephone visit    Virtual Visit via Telephone Note   This visit type was conducted due to national recommendations for restrictions regarding the COVID-19 Pandemic (e.g. social distancing) in an effort to limit this patient's exposure and mitigate transmission in our community. Due to her co-morbid illnesses, this patient is at least at moderate risk for complications without adequate follow up. This format is felt to be most appropriate for this patient at this time. The patient did not have access to video technology or had technical difficulties with video requiring transitioning to audio format only (telephone). Physical exam was limited to content and character of the telephone converstion. Shamaine, CMA was able to get the patient set up on a telephone visit.   Patient location: Home patient and provider in visit Provider location: Office  I discussed the limitations of evaluation and management by telemedicine and the availability of in person appointments. The patient expressed understanding and agreed to proceed.   Visit Date: 08/26/2022  Today's healthcare provider: Penni Homans, MD     Subjective:    Patient ID: Kathy Huerta, female    DOB: 1961/07/24, 61 y.o.   MRN: [id number]  Chief Complaint  Patient presents with   Follow-up    HPI Patient is in today for a telephone visit.  She denies having any fever, chills, ear pain, headaches, muscle pain, joint pain, new moles, rash, itching, congestion, sinus pain, sore throat, chest pain, palpitations, wheezing, nausea, vomitting, abdominal pain, diarrhea, constipation, blood in stool, dysuria, urgency, frequency and hematuria.  Anxiety/ Depression: She is currently taking Amitriptyline 25 mg at night and Sertraline 200 mg in the morning to manage her anxiety and depression. She reports that she is doing much better since her father passed away and consequently stopped taking Alprazolam 0.25 mg, Olanzapine 5 mg,  and Zolpidem 10 mg.  Immunizations: She has been informed about receiving COVID-19, high-dose Flu, and RSV immunizations. She is interested in receiving these immunizations at the end of September.   Vitamin D: She is currently taking Vitamin D 50,000 IU once a week. She is interested in alternating to a lower dosage to be taken daily.   Cough: She complains of a slight cough today.   Past Medical History:  Diagnosis Date   Allergy 2010   Anxiety    Arthritis of right hip 10/28/2016   Basal cell carcinoma 11/17/2011   Dr. Sammuel Hines   Cataract 2010   Cutaneous skin tags 07/24/2013   DDD (degenerative disc disease), cervical    s/p fusion   DDD (degenerative disc disease), thoracolumbar    on disability   Depression    Headache(784.0) 01/29/2013   Hearing loss    Saw ENT on 04/27/2017; Moderate hearing loss in both ears.   Heartburn    Hiatal hernia    HTN (hypertension) 2002   Hyperlipemia 08/2014   Hyperplastic colon polyp    Hypothyroid    Internal hemorrhoids    Obesity    Obesity 04/30/2017   Pain in joint, shoulder region 11/20/2013   Pancreatitis 04/24/2011   Pneumonia 07/24/2013   Preventative health care 10/28/2016   Tachycardia 08/12/2014   Vitamin D deficiency 11/18/2015   Past Surgical History:  Procedure Laterality Date   ANTERIOR FUSION CERVICAL SPINE  12/22/2006   C6-7  Dr Saintclair Halsted   CHOLECYSTECTOMY N/A 02/27/2013   Procedure: LAPAROSCOPIC CHOLECYSTECTOMY WITH INTRAOPERATIVE CHOLANGIOGRAM;  Surgeon: Adin Hector, MD;  Location: [facility];  Service: General;  Laterality:  N/A;   POSTERIOR FUSION CERVICAL SPINE  12/22/2009   C6-7   SPINE SURGERY  2009   Family History  Problem Relation Age of Onset   Hypertension Mother    Hyperlipidemia Mother    Arthritis Mother    Hearing loss Mother    Other Father        DDD, colectomy,cardiac stent, afib   Colon polyps Father    Anxiety disorder Father    COPD Father        died due to complications from  [id number]   Depression Father    Diabetes Father    Heart disease Father    Hypertension Brother    Diabetes Maternal Grandmother    Diabetes Paternal Grandmother    Colon cancer Neg Hx    Stomach cancer Neg Hx    Kidney disease Neg Hx    Social History   Socioeconomic History   Marital status: Widowed    Spouse name: Not on file   Number of children: 0   Years of education: Not on file   Highest education level: Not on file  Occupational History   Occupation: Disabled    Employer: UNEMPLOYED  Tobacco Use   Smoking status: Never   Smokeless tobacco: Never  Vaping Use   Vaping Use: Never used  Substance and Sexual Activity   Alcohol use: Yes    Alcohol/week: 5.0 standard drinks of alcohol    Types: 5 Cans of beer per week   Drug use: No   Sexual activity: Not Currently  Other Topics Concern   Not on file  Social History Narrative   Reviewed history and no changes required.   Occupation: Disabled,  has worked in the past for a [employer] (medical))   Married   Alcohol use-yes- every weekend 4-5 beers.   Regular exercise-no   Smoking Status:  quit   Caffeine use/day:  none   Does Patient Exercise:  no         Social Determinants of Radio broadcast assistant Strain: Not on file  Food Insecurity: Not on file  Transportation Needs: Not on file  Physical Activity: Not on file  Stress: Not on file  Social Connections: Not on file  Intimate Partner Violence: Not on file   Outpatient Medications Prior to Visit  Medication Sig Dispense Refill   amitriptyline (ELAVIL) 25 MG tablet TAKE ONE TO TWO TABLETS BY MOUTH EVERY NIGHT AT BEDTIME AS NEEDED FOR SLEEP 60 tablet 1   atorvastatin (LIPITOR) 40 MG tablet TAKE ONE TABLET BY MOUTH DAILY 30 tablet 1   diclofenac sodium (VOLTAREN) 1 % GEL Apply 4 g topically 4 (four) times daily as needed. 300 g 5   dicyclomine (BENTYL) 10 MG capsule TAKE ONE TO TWO CAPSULES BY MOUTH EVERY 6 HOURS AS NEEDED FOR SPASMS 120  capsule 2   diphenoxylate-atropine (LOMOTIL) 2.5-0.025 MG tablet TAKE 1 TO 2 TABLETS BY MOUTH FOUR TIMES DAILY AS NEEDED FOR DIARRHEA OR LOOSE STOOLS *MAX OF 8 TABLETS IN 24 HOURS* 30 tablet 0   levothyroxine (SYNTHROID) 50 MCG tablet Take 1 tablet (50 mcg total) by mouth daily before breakfast. 90 tablet 0   losartan (COZAAR) 25 MG tablet TAKE ONE TABLET BY MOUTH DAILY 30 tablet 3   pantoprazole (PROTONIX) 40 MG tablet TAKE ONE TABLET BY MOUTH DAILY 90 tablet 1   propranolol (INDERAL) 40 MG tablet TAKE 1 TABLET BY MOUTH TWICE A DAY 60 tablet 0   sertraline (ZOLOFT)  100 MG tablet Take 2 tablets (200 mg total) by mouth in the morning. 60 tablet 5   Vitamin D, Ergocalciferol, (DRISDOL) 1.25 MG (50000 UNIT) CAPS capsule TAKE 1 CAPSULE BY MOUTH EVERY 7 DAYS 4 capsule 4   ALPRAZolam (XANAX) 0.25 MG tablet Take 1-2 tablets (0.25-0.5 mg total) by mouth 2 (two) times daily as needed for anxiety. 40 tablet 1   COVID-19 mRNA vaccine, Moderna, (MODERNA COVID-19 VACCINE) 100 MCG/0.5ML injection Inject into the muscle. 0.25 mL 0   OLANZapine (ZYPREXA) 5 MG tablet Take 1 tablet (5 mg total) by mouth at bedtime. 30 tablet 3   zolpidem (AMBIEN) 10 MG tablet 1/2 to 1 tab po qhs prn insomnia 15 tablet 3   No facility-administered medications prior to visit.   Allergies  Allergen Reactions   Amoxicillin-Pot Clavulanate     REACTION: Throat swelling.   Azithromycin     REACTION: Throat Swelling   Cefazolin     REACTION: Throat Swelling   Moxifloxacin     REACTION: Throat Swelling   Penicillins     REACTION: throat swelling   Sulfonamide Derivatives    Review of Systems  Constitutional:  Negative for chills and fever.  HENT:  Negative for congestion, ear pain, sinus pain and sore throat.   Respiratory:  Positive for cough. Negative for shortness of breath and wheezing.   Cardiovascular:  Negative for chest pain and palpitations.  Gastrointestinal:  Negative for abdominal pain, blood in stool,  constipation, diarrhea, nausea and vomiting.  Genitourinary:  Negative for dysuria, frequency, hematuria and urgency.  Musculoskeletal:  Negative for joint pain and myalgias.  Skin:  Negative for itching and rash.       (-) New moles.  Neurological:  Negative for headaches.      Objective:    Physical Exam  BP 132/78 (BP Location: Right Arm, Patient Position: Sitting, Cuff Size: Normal)   LMP 09/23/2011  Wt Readings from Last 3 Encounters:  05/15/22 208 lb (94.3 kg)  04/12/21 203 lb (92.1 kg)  02/21/20 198 lb (89.8 kg)   Diabetic Foot Exam - Simple   No data filed    Lab Results  Component Value Date   WBC 6.7 01/07/2022   HGB 13.4 01/07/2022   HCT 40.6 01/07/2022   PLT 285.0 01/07/2022   GLUCOSE 82 01/07/2022   CHOL 182 01/07/2022   TRIG 204.0 (H) 01/07/2022   HDL 66.50 01/07/2022   LDLDIRECT 87.0 01/07/2022   LDLCALC 83 08/06/2021   ALT 19 01/07/2022   AST 18 01/07/2022   NA 140 01/07/2022   K 4.6 01/07/2022   CL 104 01/07/2022   CREATININE 0.61 01/07/2022   BUN 11 01/07/2022   CO2 27 01/07/2022   TSH 2.57 01/07/2022   HGBA1C 5.9 01/07/2022   Lab Results  Component Value Date   TSH 2.57 01/07/2022   Lab Results  Component Value Date   WBC 6.7 01/07/2022   HGB 13.4 01/07/2022   HCT 40.6 01/07/2022   MCV 90.9 01/07/2022   PLT 285.0 01/07/2022   Lab Results  Component Value Date   NA 140 01/07/2022   K 4.6 01/07/2022   CO2 27 01/07/2022   GLUCOSE 82 01/07/2022   BUN 11 01/07/2022   CREATININE 0.61 01/07/2022   BILITOT 0.4 01/07/2022   ALKPHOS 81 01/07/2022   AST 18 01/07/2022   ALT 19 01/07/2022   PROT 7.3 01/07/2022   ALBUMIN 4.4 01/07/2022   CALCIUM 9.0 01/07/2022   GFR 96.24 01/07/2022  Lab Results  Component Value Date   CHOL 182 01/07/2022   Lab Results  Component Value Date   HDL 66.50 01/07/2022   Lab Results  Component Value Date   LDLCALC 83 08/06/2021   Lab Results  Component Value Date   TRIG 204.0 (H) 01/07/2022    Lab Results  Component Value Date   CHOLHDL 3 01/07/2022   Lab Results  Component Value Date   HGBA1C 5.9 01/07/2022      Assessment & Plan:   Problem List Items Addressed This Visit     Hypertension    Well controlled, no changes to meds. Encouraged heart healthy diet such as the DASH diet and exercise as tolerated.       Relevant Orders   CBC   Comprehensive metabolic panel   TSH   Hyperlipidemia    Encourage heart healthy diet such as MIND or DASH diet, increase exercise, avoid trans fats, simple carbohydrates and processed foods, consider a krill or fish or flaxseed oil cap daily.       Relevant Orders   Lipid panel   Tachycardia    On Propranolol      Vitamin D deficiency    Supplement and monitor      Relevant Orders   VITAMIN D 25 Hydroxy (Vit-D Deficiency, Fractures)   Hyperglycemia    hgba1c acceptable, minimize simple carbs. Increase exercise as tolerated.       Relevant Orders   Hemoglobin A1c   Hypothyroidism    On Levothyroxine, continue to monitor      Insomnia    Encouraged good sleep hygiene such as dark, quiet room. No blue/green glowing lights such as computer screens in bedroom. No alcohol or stimulants in evening. Cut down on caffeine as able. Regular exercise is helpful but not just prior to bed time. Doing well on current meds, continue Amitriptyline      Grief reaction    Added Zyprexa at last visit but only took one dose she feels she is doing much better and has not needed the Zyprexa of the Alprazolam. No changes just continue the Amitriptyline and Sertraline      No orders of the defined types were placed in this encounter. I discussed the assessment and treatment plan with the patient. The patient was provided an opportunity to ask questions and all were answered. The patient agreed with the plan and demonstrated an understanding of the instructions.   The patient was advised to call back or seek an in-person evaluation if the  symptoms worsen or if the condition fails to improve as anticipated.   I,Mohammed Iqbal,acting as a scribe for Penni Homans, MD.,have documented all relevant documentation on the behalf of Penni Homans, MD,as directed by  Penni Homans, MD while in the presence of Penni Homans, MD.  30 minute telephone encounter Penni Homans, MD [facility] [phone] (phone) [phone] (fax)  [place]

## 2022-08-27 ENCOUNTER — Other Ambulatory Visit: Payer: Self-pay | Admitting: Family Medicine

## 2022-08-31 ENCOUNTER — Other Ambulatory Visit: Payer: Self-pay | Admitting: Family Medicine

## 2022-09-08 ENCOUNTER — Other Ambulatory Visit: Payer: Self-pay | Admitting: Family Medicine

## 2022-09-15 ENCOUNTER — Other Ambulatory Visit: Payer: 59

## 2022-09-16 ENCOUNTER — Other Ambulatory Visit: Payer: 59

## 2022-09-16 ENCOUNTER — Ambulatory Visit: Payer: 59

## 2022-09-22 ENCOUNTER — Other Ambulatory Visit: Payer: Self-pay | Admitting: Family Medicine

## 2022-09-22 MED ORDER — DIPHENOXYLATE-ATROPINE 2.5-0.025 MG PO TABS: ORAL_TABLET | ORAL | 1 refills | Status: DC

## 2022-09-24 ENCOUNTER — Ambulatory Visit (INDEPENDENT_AMBULATORY_CARE_PROVIDER_SITE_OTHER): Payer: 59

## 2022-09-24 ENCOUNTER — Other Ambulatory Visit (INDEPENDENT_AMBULATORY_CARE_PROVIDER_SITE_OTHER): Payer: 59

## 2022-09-24 DIAGNOSIS — E559 Vitamin D deficiency, unspecified: Secondary | ICD-10-CM

## 2022-09-24 DIAGNOSIS — Z23 Encounter for immunization: Secondary | ICD-10-CM | POA: Diagnosis not present

## 2022-09-24 DIAGNOSIS — I10 Essential (primary) hypertension: Secondary | ICD-10-CM | POA: Diagnosis not present

## 2022-09-24 DIAGNOSIS — R739 Hyperglycemia, unspecified: Secondary | ICD-10-CM | POA: Diagnosis not present

## 2022-09-24 DIAGNOSIS — E782 Mixed hyperlipidemia: Secondary | ICD-10-CM | POA: Diagnosis not present

## 2022-09-24 NOTE — Progress Notes (Deleted)
Patient here for flu vaccine.  Vaccine given in left deltoid and patient tolerated well. 

## 2022-09-24 NOTE — Progress Notes (Signed)
Kathy Huerta is a 61 y.o. female presents to the office today for Influenza injections, per physician's orders. Original order:  Influenza (med),  (dose),  left deltoid  (route) was administered  (location) today. Patient tolerated injection.    Jeidi Gilles M Rianna Lukes

## 2022-09-25 LAB — COMPREHENSIVE METABOLIC PANEL
ALT: 36 U/L — ABNORMAL HIGH (ref 0–35)
AST: 26 U/L (ref 0–37)
Albumin: 4.7 g/dL (ref 3.5–5.2)
Alkaline Phosphatase: 96 U/L (ref 39–117)
BUN: 12 mg/dL (ref 6–23)
CO2: 30 mEq/L (ref 19–32)
Calcium: 9.8 mg/dL (ref 8.4–10.5)
Chloride: 101 mEq/L (ref 96–112)
Creatinine, Ser: 0.74 mg/dL (ref 0.40–1.20)
GFR: 86.66 mL/min (ref >60.00)
Glucose, Bld: 85 mg/dL (ref 70–99)
Potassium: 5.1 mEq/L (ref 3.5–5.1)
Sodium: 140 mEq/L (ref 135–145)
Total Bilirubin: 0.6 mg/dL (ref 0.2–1.2)
Total Protein: 7.7 g/dL (ref 6.0–8.3)

## 2022-09-25 LAB — CBC
HCT: 44.4 % (ref 36.0–46.0)
Hemoglobin: 14.6 g/dL (ref 12.0–15.0)
MCHC: 32.9 g/dL (ref 30.0–36.0)
MCV: 92.1 fl (ref 78.0–100.0)
Platelets: 272 10*3/uL (ref 150.0–400.0)
RBC: 4.82 Mil/uL (ref 3.87–5.11)
RDW: 14 % (ref 11.5–15.5)
WBC: 8.7 10*3/uL (ref 4.0–10.5)

## 2022-09-25 LAB — LIPID PANEL
Cholesterol: 180 mg/dL (ref 0–200)
HDL: 69.7 mg/dL (ref >39.00)
NonHDL: 109.8
Total CHOL/HDL Ratio: 3
Triglycerides: 215 mg/dL — ABNORMAL HIGH (ref 0.0–149.0)
VLDL: 43 mg/dL — ABNORMAL HIGH (ref 0.0–40.0)

## 2022-09-25 LAB — LDL CHOLESTEROL, DIRECT: Direct LDL: 85 mg/dL

## 2022-09-25 LAB — TSH: TSH: 2.09 u[IU]/mL (ref 0.35–5.50)

## 2022-09-25 LAB — HEMOGLOBIN A1C: Hgb A1c MFr Bld: 6.1 % (ref 4.6–6.5)

## 2022-09-25 LAB — VITAMIN D 25 HYDROXY (VIT D DEFICIENCY, FRACTURES): VITD: 43.8 ng/mL (ref 30.00–100.00)

## 2022-09-26 ENCOUNTER — Other Ambulatory Visit: Payer: Self-pay | Admitting: Family Medicine

## 2022-10-04 ENCOUNTER — Other Ambulatory Visit: Payer: Self-pay | Admitting: Family Medicine

## 2022-10-23 ENCOUNTER — Other Ambulatory Visit: Payer: Self-pay | Admitting: Family Medicine

## 2022-11-07 ENCOUNTER — Other Ambulatory Visit (HOSPITAL_BASED_OUTPATIENT_CLINIC_OR_DEPARTMENT_OTHER): Payer: Self-pay

## 2022-11-07 MED ORDER — COMIRNATY 30 MCG/0.3ML IM SUSY
PREFILLED_SYRINGE | INTRAMUSCULAR | 0 refills | Status: DC
  Filled 2022-11-07: qty 0.3, 1d supply, fill #0

## 2022-11-15 ENCOUNTER — Other Ambulatory Visit: Payer: Self-pay | Admitting: Family Medicine

## 2022-11-16 ENCOUNTER — Other Ambulatory Visit: Payer: Self-pay | Admitting: Family Medicine

## 2022-12-06 ENCOUNTER — Other Ambulatory Visit: Payer: Self-pay | Admitting: Family Medicine

## 2022-12-13 ENCOUNTER — Other Ambulatory Visit: Payer: Self-pay | Admitting: Family Medicine

## 2023-01-06 ENCOUNTER — Encounter: Payer: Self-pay | Admitting: Internal Medicine

## 2023-02-11 ENCOUNTER — Other Ambulatory Visit: Payer: Self-pay | Admitting: Family Medicine

## 2023-02-14 ENCOUNTER — Other Ambulatory Visit: Payer: Self-pay | Admitting: Family Medicine

## 2023-02-22 NOTE — Assessment & Plan Note (Signed)
Encourage heart healthy diet such as MIND or DASH diet, increase exercise, avoid trans fats, simple carbohydrates and processed foods, consider a krill or fish or flaxseed oil cap daily.  °

## 2023-02-22 NOTE — Assessment & Plan Note (Signed)
Supplement and monitor 

## 2023-02-22 NOTE — Assessment & Plan Note (Signed)
hgba1c acceptable, minimize simple carbs. Increase exercise as tolerated.  

## 2023-02-22 NOTE — Assessment & Plan Note (Signed)
On Levothyroxine, continue to monitor 

## 2023-02-22 NOTE — Assessment & Plan Note (Signed)
Well controlled, no changes to meds. Encouraged heart healthy diet such as the DASH diet and exercise as tolerated.  °

## 2023-02-23 ENCOUNTER — Ambulatory Visit: Payer: 59 | Admitting: Family Medicine

## 2023-02-23 ENCOUNTER — Encounter: Payer: Self-pay | Admitting: Family Medicine

## 2023-02-23 VITALS — BP 124/78 | HR 72 | Temp 98.0°F | Resp 16 | Ht 67.0 in | Wt 214.0 lb

## 2023-02-23 DIAGNOSIS — I10 Essential (primary) hypertension: Secondary | ICD-10-CM

## 2023-02-23 DIAGNOSIS — Z79899 Other long term (current) drug therapy: Secondary | ICD-10-CM

## 2023-02-23 DIAGNOSIS — F3341 Major depressive disorder, recurrent, in partial remission: Secondary | ICD-10-CM

## 2023-02-23 DIAGNOSIS — F4320 Adjustment disorder, unspecified: Secondary | ICD-10-CM

## 2023-02-23 DIAGNOSIS — Z0000 Encounter for general adult medical examination without abnormal findings: Secondary | ICD-10-CM | POA: Diagnosis not present

## 2023-02-23 DIAGNOSIS — E039 Hypothyroidism, unspecified: Secondary | ICD-10-CM

## 2023-02-23 DIAGNOSIS — M25561 Pain in right knee: Secondary | ICD-10-CM

## 2023-02-23 DIAGNOSIS — F4321 Adjustment disorder with depressed mood: Secondary | ICD-10-CM

## 2023-02-23 DIAGNOSIS — R739 Hyperglycemia, unspecified: Secondary | ICD-10-CM

## 2023-02-23 DIAGNOSIS — E782 Mixed hyperlipidemia: Secondary | ICD-10-CM | POA: Diagnosis not present

## 2023-02-23 DIAGNOSIS — R197 Diarrhea, unspecified: Secondary | ICD-10-CM

## 2023-02-23 DIAGNOSIS — E559 Vitamin D deficiency, unspecified: Secondary | ICD-10-CM

## 2023-02-23 MED ORDER — DIPHENOXYLATE-ATROPINE 2.5-0.025 MG PO TABS: ORAL_TABLET | ORAL | 1 refills | Status: DC

## 2023-02-23 NOTE — Patient Instructions (Addendum)
Pendulum probiotic and Polyphenol  Add back the fiber  Preventive Care 77-63 Years Old, Female Preventive care refers to lifestyle choices and visits with your health care provider that can promote health and wellness. Preventive care visits are also called wellness exams. What can I expect for my preventive care visit? Counseling Your health care provider may ask you questions about your: Medical history, including: Past medical problems. Family medical history. Pregnancy history. Current health, including: Menstrual cycle. Method of birth control. Emotional well-being. Home life and relationship well-being. Sexual activity and sexual health. Lifestyle, including: Alcohol, nicotine or tobacco, and drug use. Access to firearms. Diet, exercise, and sleep habits. Work and work Statistician. Sunscreen use. Safety issues such as seatbelt and bike helmet use. Physical exam Your health care provider will check your: Height and weight. These may be used to calculate your BMI (body mass index). BMI is a measurement that tells if you are at a healthy weight. Waist circumference. This measures the distance around your waistline. This measurement also tells if you are at a healthy weight and may help predict your risk of certain diseases, such as type 2 diabetes and high blood pressure. Heart rate and blood pressure. Body temperature. Skin for abnormal spots. What immunizations do I need?  Vaccines are usually given at various ages, according to a schedule. Your health care provider will recommend vaccines for you based on your age, medical history, and lifestyle or other factors, such as travel or where you work. What tests do I need? Screening Your health care provider may recommend screening tests for certain conditions. This may include: Lipid and cholesterol levels. Diabetes screening. This is done by checking your blood sugar (glucose) after you have not eaten for a while  (fasting). Pelvic exam and Pap test. Hepatitis B test. Hepatitis C test. HIV (human immunodeficiency virus) test. STI (sexually transmitted infection) testing, if you are at risk. Lung cancer screening. Colorectal cancer screening. Mammogram. Talk with your health care provider about when you should start having regular mammograms. This may depend on whether you have a family history of breast cancer. BRCA-related cancer screening. This may be done if you have a family history of breast, ovarian, tubal, or peritoneal cancers. Bone density scan. This is done to screen for osteoporosis. Talk with your health care provider about your test results, treatment options, and if necessary, the need for more tests. Follow these instructions at home: Eating and drinking  Eat a diet that includes fresh fruits and vegetables, whole grains, lean protein, and low-fat dairy products. Take vitamin and mineral supplements as recommended by your health care provider. Do not drink alcohol if: Your health care provider tells you not to drink. You are pregnant, may be pregnant, or are planning to become pregnant. If you drink alcohol: Limit how much you have to 0-1 drink a day. Know how much alcohol is in your drink. In the [place] one drink equals one 12 oz bottle of beer (355 mL), one 5 oz glass of wine (148 mL), or one 1 oz glass of hard liquor (44 mL). Lifestyle Brush your teeth every morning and night with fluoride toothpaste. Floss one time each day. Exercise for at least 30 minutes 5 or more days each week. Do not use any products that contain nicotine or tobacco. These products include cigarettes, chewing tobacco, and vaping devices, such as e-cigarettes. If you need help quitting, ask your health care provider. Do not use drugs. If you are sexually active, practice safe  sex. Use a condom or other form of protection to prevent STIs. If you do not wish to become pregnant, use a form of birth control. If  you plan to become pregnant, see your health care provider for a prepregnancy visit. Take aspirin only as told by your health care provider. Make sure that you understand how much to take and what form to take. Work with your health care provider to find out whether it is safe and beneficial for you to take aspirin daily. Find healthy ways to manage stress, such as: Meditation, yoga, or listening to music. Journaling. Talking to a trusted person. Spending time with friends and family. Minimize exposure to UV radiation to reduce your risk of skin cancer. Safety Always wear your seat belt while driving or riding in a vehicle. Do not drive: If you have been drinking alcohol. Do not ride with someone who has been drinking. When you are tired or distracted. While texting. If you have been using any mind-altering substances or drugs. Wear a helmet and other protective equipment during sports activities. If you have firearms in your house, make sure you follow all gun safety procedures. Seek help if you have been physically or sexually abused. What's next? Visit your health care provider once a year for an annual wellness visit. Ask your health care provider how often you should have your eyes and teeth checked. Stay up to date on all vaccines. This information is not intended to replace advice given to you by your health care provider. Make sure you discuss any questions you have with your health care provider. Document Revised: 06/05/2021 Document Reviewed: 06/05/2021 Elsevier Patient Education  [place].

## 2023-02-23 NOTE — Progress Notes (Signed)
Subjective:   By signing my name below, I, Jamey Reas, attest that this documentation has been prepared under the direction and in the presence of Mosie Lukes, MD. 02/23/2023   Patient ID: Kathy Huerta, female    DOB: 04/30/1961, 62 y.o.   MRN: [id number]  Chief Complaint  Patient presents with   Annual Exam    Annual Exam    HPI Patient is in today for an annual exam.   Lomotil She takes 0.025 mg lomotil 2-3 times a week as needed to manage loose stools.   Right knee pain She complains of right knee pain along the back of her leg that radiates down towards the foot.   Mood She has been experiencing elevated stress levels for the last couple of years. She is interested in receiving grief counseling.   Weight She reports pushing off eating until later and then often times eats fast food as a result.  Wt Readings from Last 3 Encounters:  02/23/23 214 lb (97.1 kg)  05/15/22 208 lb (94.3 kg)  04/12/21 203 lb (92.1 kg)   Immunizations She is UTD on the tetanus and shingles immunizations. She is UTD on the influenza and Covid-19 immunizations.   Advanced Directives Discussed advanced directives during visit.   Social History Her dad passed away a year ago. Her mother is living and is 51 yo.   Past Medical History:  Diagnosis Date   Allergy 2010   Anxiety    Arthritis of right hip 10/28/2016   Basal cell carcinoma 11/17/2011   Dr. Sammuel Hines   Cataract 2010   Cutaneous skin tags 07/24/2013   DDD (degenerative disc disease), cervical    s/p fusion   DDD (degenerative disc disease), thoracolumbar    on disability   Depression    Headache(784.0) 01/29/2013   Hearing loss    Saw ENT on 04/27/2017; Moderate hearing loss in both ears.   Heartburn    Hiatal hernia    HTN (hypertension) 2002   Hyperlipemia 08/2014   Hyperplastic colon polyp    Hypothyroid    Internal hemorrhoids    Obesity    Obesity 04/30/2017   Pain in joint, shoulder region 11/20/2013    Pancreatitis 04/24/2011   Pneumonia 07/24/2013   Preventative health care 10/28/2016   Tachycardia 08/12/2014   Vitamin D deficiency 11/18/2015    Past Surgical History:  Procedure Laterality Date   ANTERIOR FUSION CERVICAL SPINE  12/22/2006   C6-7  Dr Saintclair Halsted   CHOLECYSTECTOMY N/A 02/27/2013   Procedure: LAPAROSCOPIC CHOLECYSTECTOMY WITH INTRAOPERATIVE CHOLANGIOGRAM;  Surgeon: Adin Hector, MD;  Location: [facility];  Service: General;  Laterality: N/A;   POSTERIOR FUSION CERVICAL SPINE  12/22/2009   C6-7   SPINE SURGERY  2009    Family History  Problem Relation Age of Onset   Hypertension Mother    Hyperlipidemia Mother    Arthritis Mother    Hearing loss Mother    Other Father        DDD, colectomy,cardiac stent, afib   Colon polyps Father    Anxiety disorder Father    COPD Father        died due to complications from 0000000   Depression Father    Diabetes Father    Heart disease Father    Hypertension Brother    Diabetes Maternal Grandmother    Diabetes Paternal Grandmother    Colon cancer Neg Hx    Stomach cancer Neg Hx    Kidney  disease Neg Hx     Social History   Socioeconomic History   Marital status: Widowed    Spouse name: Not on file   Number of children: 0   Years of education: Not on file   Highest education level: Not on file  Occupational History   Occupation: Disabled    Employer: UNEMPLOYED  Tobacco Use   Smoking status: Never   Smokeless tobacco: Never  Vaping Use   Vaping Use: Never used  Substance and Sexual Activity   Alcohol use: Yes    Alcohol/week: 5.0 standard drinks of alcohol    Types: 5 Cans of beer per week   Drug use: No   Sexual activity: Not Currently  Other Topics Concern   Not on file  Social History Narrative   Reviewed history and no changes required.   Occupation: Disabled,  has worked in the past for a [employer] (medical))   Married   Alcohol use-yes- every weekend 4-5 beers.   Regular exercise-no    Smoking Status:  quit   Caffeine use/day:  none   Does Patient Exercise:  no         Social Determinants of Radio broadcast assistant Strain: Not on file  Food Insecurity: Not on file  Transportation Needs: Not on file  Physical Activity: Not on file  Stress: Not on file  Social Connections: Not on file  Intimate Partner Violence: Not on file    Outpatient Medications Prior to Visit  Medication Sig Dispense Refill   amitriptyline (ELAVIL) 25 MG tablet Take 1-2 tablets (25-50 mg total) by mouth at bedtime as needed for sleep. 60 tablet 1   atorvastatin (LIPITOR) 40 MG tablet Take 1 tablet (40 mg total) by mouth daily. 90 tablet 1   COVID-19 mRNA vaccine 2023-2024 (COMIRNATY) syringe Inject into the muscle. 0.3 mL 0   diclofenac sodium (VOLTAREN) 1 % GEL Apply 4 g topically 4 (four) times daily as needed. 300 g 5   dicyclomine (BENTYL) 10 MG capsule TAKE ONE TO TWO CAPSULES BY MOUTH EVERY 6 HOURS AS NEEDED FOR SPASMS 120 capsule 2   diphenoxylate-atropine (LOMOTIL) 2.5-0.025 MG tablet TAKE 1 TO 2 TABLETS BY MOUTH FOUR TIMES DAILY AS NEEDED FOR DIARRHEA OR LOOSE STOOLS *MAX OF 8 TABLETS IN 24 HOURS* Strength: 2.5-0.025 mg 30 tablet 1   levothyroxine (SYNTHROID) 50 MCG tablet TAKE 1 TABLET BY MOUTH DAILY BEFORE BREAKFAST 90 tablet 1   losartan (COZAAR) 25 MG tablet Take 1 tablet (25 mg total) by mouth daily. 90 tablet 1   pantoprazole (PROTONIX) 40 MG tablet TAKE 1 TABLET BY MOUTH DAILY 30 tablet 3   propranolol (INDERAL) 40 MG tablet Take 1 tablet (40 mg total) by mouth 2 (two) times daily. 180 tablet 1   sertraline (ZOLOFT) 100 MG tablet Take 2 tablets (200 mg total) by mouth every morning. 180 tablet 1   Vitamin D, Ergocalciferol, (DRISDOL) 1.25 MG (50000 UNIT) CAPS capsule TAKE 1 CAPSULE BY MOUTH EVERY 7 DAYS 4 capsule 4   No facility-administered medications prior to visit.    Allergies  Allergen Reactions   Amoxicillin-Pot Clavulanate     REACTION: Throat swelling.    Azithromycin     REACTION: Throat Swelling   Cefazolin     REACTION: Throat Swelling   Moxifloxacin     REACTION: Throat Swelling   Penicillins     REACTION: throat swelling   Sulfonamide Derivatives     Review of Systems  Gastrointestinal:        (+) loose stools   Musculoskeletal:  Positive for joint pain (right knee).  Psychiatric/Behavioral:         (+) Stress       Objective:    Physical Exam Constitutional:      General: She is not in acute distress.    Appearance: Normal appearance.  HENT:     Head: Normocephalic and atraumatic.     Right Ear: Tympanic membrane, ear canal and external ear normal.     Left Ear: Tympanic membrane, ear canal and external ear normal.     Mouth/Throat:     Mouth: Mucous membranes are dry.     Pharynx: Oropharynx is clear.  Eyes:     Extraocular Movements: Extraocular movements intact.     Right eye: No nystagmus.     Left eye: No nystagmus.     Pupils: Pupils are equal, round, and reactive to light.  Cardiovascular:     Rate and Rhythm: Normal rate and regular rhythm.     Heart sounds: Normal heart sounds. No murmur heard.    No gallop.  Pulmonary:     Effort: Pulmonary effort is normal. No respiratory distress.     Breath sounds: Normal breath sounds. No wheezing or rales.  Abdominal:     General: Bowel sounds are normal. There is no distension.     Palpations: Abdomen is soft.     Tenderness: There is no abdominal tenderness. There is no guarding.  Musculoskeletal:     Cervical back: Normal range of motion.  Lymphadenopathy:     Cervical: No cervical adenopathy.  Skin:    General: Skin is warm.  Neurological:     Mental Status: She is alert and oriented to person, place, and time.     Deep Tendon Reflexes:     Reflex Scores:      Patellar reflexes are 2+ on the right side and 2+ on the left side. Psychiatric:        Judgment: Judgment normal.     BP 124/78 (BP Location: Right Arm, Patient Position: Sitting, Cuff  Size: Normal)   Pulse 72   Temp 98 F (36.7 C) (Oral)   Resp 16   Ht '5\' 7"'$  (1.702 m)   Wt 214 lb (97.1 kg)   LMP 09/23/2011   SpO2 95%   BMI 33.52 kg/m  Wt Readings from Last 3 Encounters:  02/23/23 214 lb (97.1 kg)  05/15/22 208 lb (94.3 kg)  04/12/21 203 lb (92.1 kg)       Assessment & Plan:  Hyperglycemia Assessment & Plan: hgba1c acceptable, minimize simple carbs. Increase exercise as tolerated.    Mixed hyperlipidemia Assessment & Plan: Encourage heart healthy diet such as MIND or DASH diet, increase exercise, avoid trans fats, simple carbohydrates and processed foods, consider a krill or fish or flaxseed oil cap daily.    Primary hypertension Assessment & Plan: Well controlled, no changes to meds. Encouraged heart healthy diet such as the DASH diet and exercise as tolerated.    Hypothyroidism, unspecified type Assessment & Plan: On Levothyroxine, continue to monitor   Vitamin D deficiency Assessment & Plan: Supplement and monitor    High risk medication use -     Drug Monitoring Panel [phone] , Urine    I, Jamey Reas, personally preformed the services described in this documentation.  All medical record entries made by the scribe were at my direction and in my presence.  I have reviewed the chart and discharge instructions (if applicable) and agree that the record reflects my personal performance and is accurate and complete. 02/23/2023  Jamey Reas   Lacretia Leigh as a scribe for Penni Homans, MD.,have documented all relevant documentation on the behalf of Penni Homans, MD,as directed by  Penni Homans, MD while in the presence of Penni Homans, MD.

## 2023-02-23 NOTE — Assessment & Plan Note (Signed)
Only 2-3 episodes a week. Lomotil helps. Encouraged to start Fiber back such as Benefiber daily, Pendulum probiotic.

## 2023-02-25 DIAGNOSIS — M25561 Pain in right knee: Secondary | ICD-10-CM | POA: Insufficient documentation

## 2023-02-25 NOTE — Assessment & Plan Note (Signed)
Her husband passed away 2 years ago and her father a year ago and she is managing her 63 yo mother. She is managing well most days but agrees to referral to [facility]

## 2023-02-25 NOTE — Assessment & Plan Note (Signed)
Encouraged moist heat and gentle stretching as tolerated. May try NSAIDs and prescription meds as directed and report if symptoms worsen or seek immediate care 

## 2023-02-25 NOTE — Assessment & Plan Note (Signed)
Patient encouraged to maintain heart healthy diet, regular exercise, adequate sleep. Consider daily probiotics. Take medications as prescribed. Labs ordered and reviewed. Given and reviewed copy of ACP documents from [place] Secretary of State and encouraged to complete and return 

## 2023-03-28 ENCOUNTER — Other Ambulatory Visit: Payer: Self-pay | Admitting: Family Medicine

## 2023-04-20 ENCOUNTER — Other Ambulatory Visit: Payer: Self-pay | Admitting: Family Medicine

## 2023-05-12 ENCOUNTER — Other Ambulatory Visit: Payer: Self-pay | Admitting: Family Medicine

## 2023-06-07 ENCOUNTER — Other Ambulatory Visit: Payer: Self-pay | Admitting: Family Medicine

## 2023-07-28 ENCOUNTER — Other Ambulatory Visit: Payer: Self-pay | Admitting: Family Medicine

## 2023-07-29 ENCOUNTER — Other Ambulatory Visit: Payer: Self-pay | Admitting: Family Medicine

## 2023-08-06 ENCOUNTER — Encounter (INDEPENDENT_AMBULATORY_CARE_PROVIDER_SITE_OTHER): Payer: Self-pay

## 2023-08-07 ENCOUNTER — Other Ambulatory Visit: Payer: Self-pay | Admitting: Family Medicine

## 2023-08-26 NOTE — Assessment & Plan Note (Signed)
Encourage heart healthy diet such as MIND or DASH diet, increase exercise, avoid trans fats, simple carbohydrates and processed foods, consider a krill or fish or flaxseed oil cap daily.  °

## 2023-08-26 NOTE — Assessment & Plan Note (Signed)
On Levothyroxine, continue to monitor 

## 2023-08-26 NOTE — Assessment & Plan Note (Signed)
Supplement and monitor 

## 2023-08-26 NOTE — Assessment & Plan Note (Signed)
Well controlled, no changes to meds. Encouraged heart healthy diet such as the DASH diet and exercise as tolerated.  °

## 2023-08-26 NOTE — Assessment & Plan Note (Signed)
hgba1c acceptable, minimize simple carbs. Increase exercise as tolerated.  

## 2023-08-26 NOTE — Assessment & Plan Note (Addendum)
Encouraged moist heat and gentle stretching as tolerated. May try NSAIDs and prescription meds as directed and report if symptoms worsen or seek immediate care 

## 2023-08-27 ENCOUNTER — Ambulatory Visit: Payer: 59 | Admitting: Family Medicine

## 2023-08-27 VITALS — BP 134/84 | HR 82 | Temp 98.7°F | Resp 16 | Wt 208.0 lb

## 2023-08-27 DIAGNOSIS — F3341 Major depressive disorder, recurrent, in partial remission: Secondary | ICD-10-CM

## 2023-08-27 DIAGNOSIS — Z124 Encounter for screening for malignant neoplasm of cervix: Secondary | ICD-10-CM

## 2023-08-27 DIAGNOSIS — E039 Hypothyroidism, unspecified: Secondary | ICD-10-CM | POA: Diagnosis not present

## 2023-08-27 DIAGNOSIS — Z1231 Encounter for screening mammogram for malignant neoplasm of breast: Secondary | ICD-10-CM

## 2023-08-27 DIAGNOSIS — M549 Dorsalgia, unspecified: Secondary | ICD-10-CM | POA: Diagnosis not present

## 2023-08-27 DIAGNOSIS — E559 Vitamin D deficiency, unspecified: Secondary | ICD-10-CM | POA: Diagnosis not present

## 2023-08-27 DIAGNOSIS — E782 Mixed hyperlipidemia: Secondary | ICD-10-CM | POA: Diagnosis not present

## 2023-08-27 DIAGNOSIS — I10 Essential (primary) hypertension: Secondary | ICD-10-CM

## 2023-08-27 DIAGNOSIS — F4321 Adjustment disorder with depressed mood: Secondary | ICD-10-CM

## 2023-08-27 DIAGNOSIS — Z23 Encounter for immunization: Secondary | ICD-10-CM

## 2023-08-27 DIAGNOSIS — R739 Hyperglycemia, unspecified: Secondary | ICD-10-CM

## 2023-08-27 MED ORDER — ZOLPIDEM TARTRATE 10 MG PO TABS: 10.0000 mg | ORAL_TABLET | Freq: Every evening | ORAL | 3 refills | Status: DC | PRN

## 2023-08-27 MED ORDER — AMITRIPTYLINE HCL 50 MG PO TABS: 50.0000 mg | ORAL_TABLET | Freq: Every evening | ORAL | 3 refills | Status: DC | PRN

## 2023-08-27 NOTE — Patient Instructions (Signed)
[employer]

## 2023-08-28 LAB — COMPREHENSIVE METABOLIC PANEL
ALT: 27 U/L (ref 0–35)
AST: 21 U/L (ref 0–37)
Albumin: 4.3 g/dL (ref 3.5–5.2)
Alkaline Phosphatase: 91 U/L (ref 39–117)
BUN: 12 mg/dL (ref 6–23)
CO2: 30 meq/L (ref 19–32)
Calcium: 9.9 mg/dL (ref 8.4–10.5)
Chloride: 103 meq/L (ref 96–112)
Creatinine, Ser: 0.61 mg/dL (ref 0.40–1.20)
GFR: 95.14 mL/min (ref >60.00)
Glucose, Bld: 85 mg/dL (ref 70–99)
Potassium: 5.3 meq/L — ABNORMAL HIGH (ref 3.5–5.1)
Sodium: 141 meq/L (ref 135–145)
Total Bilirubin: 0.5 mg/dL (ref 0.2–1.2)
Total Protein: 7.4 g/dL (ref 6.0–8.3)

## 2023-08-28 LAB — CBC WITH DIFFERENTIAL/PLATELET
Basophils Absolute: 0.1 10*3/uL (ref 0.0–0.1)
Basophils Relative: 1.1 % (ref 0.0–3.0)
Eosinophils Absolute: 0.2 10*3/uL (ref 0.0–0.7)
Eosinophils Relative: 2 % (ref 0.0–5.0)
HCT: 42.8 % (ref 36.0–46.0)
Hemoglobin: 13.7 g/dL (ref 12.0–15.0)
Lymphocytes Relative: 23.9 % (ref 12.0–46.0)
Lymphs Abs: 1.8 10*3/uL (ref 0.7–4.0)
MCHC: 32.1 g/dL (ref 30.0–36.0)
MCV: 92 fl (ref 78.0–100.0)
Monocytes Absolute: 0.6 10*3/uL (ref 0.1–1.0)
Monocytes Relative: 7.2 % (ref 3.0–12.0)
Neutro Abs: 5 10*3/uL (ref 1.4–7.7)
Neutrophils Relative %: 65.8 % (ref 43.0–77.0)
Platelets: 285 10*3/uL (ref 150.0–400.0)
RBC: 4.66 Mil/uL (ref 3.87–5.11)
RDW: 13.2 % (ref 11.5–15.5)
WBC: 7.7 10*3/uL (ref 4.0–10.5)

## 2023-08-28 LAB — HEMOGLOBIN A1C: Hgb A1c MFr Bld: 6 % (ref 4.6–6.5)

## 2023-08-28 LAB — LIPID PANEL
Cholesterol: 175 mg/dL (ref 0–200)
HDL: 68.3 mg/dL (ref >39.00)
LDL Cholesterol: 73 mg/dL (ref 0–99)
NonHDL: 106.87
Total CHOL/HDL Ratio: 3
Triglycerides: 167 mg/dL — ABNORMAL HIGH (ref 0.0–149.0)
VLDL: 33.4 mg/dL (ref 0.0–40.0)

## 2023-08-28 LAB — VITAMIN D 25 HYDROXY (VIT D DEFICIENCY, FRACTURES): VITD: 25 ng/mL — ABNORMAL LOW (ref 30.00–100.00)

## 2023-08-28 LAB — TSH: TSH: 1.84 u[IU]/mL (ref 0.35–5.50)

## 2023-08-29 ENCOUNTER — Other Ambulatory Visit: Payer: Self-pay | Admitting: Family

## 2023-08-29 MED ORDER — VITAMIN D (ERGOCALCIFEROL) 1.25 MG (50000 UNIT) PO CAPS: ORAL_CAPSULE | ORAL | 0 refills | Status: DC

## 2023-08-31 ENCOUNTER — Encounter: Payer: Self-pay | Admitting: Family Medicine

## 2023-08-31 ENCOUNTER — Other Ambulatory Visit: Payer: Self-pay

## 2023-08-31 DIAGNOSIS — Z124 Encounter for screening for malignant neoplasm of cervix: Secondary | ICD-10-CM | POA: Insufficient documentation

## 2023-08-31 NOTE — Assessment & Plan Note (Signed)
Per patient preference referred to OB/GYN for screening. MGM ordered for screening today

## 2023-08-31 NOTE — Progress Notes (Signed)
Subjective:    Patient ID: Kathy Huerta, female    DOB: [birth year], 62 y.o.   MRN: [id number]  Chief Complaint  Patient presents with   Insomnia    Here for follow up   Hypertension    Here for follow up    HPI Discussed the use of AI scribe software for clinical note transcription with the patient, who gave verbal consent to proceed.  History of Present Illness   The patient, with a history of insomnia, reports a pattern of initially taking half a dose of medication, then increasing to a full dose if sleep is not achieved. She also mentions taking amitriptyline, but not concurrently with the sleep medication. She has noticed more instances of taking the full dose recently. She also reports a recent weight loss of 100 pounds, achieved through significant calorie reduction. She acknowledges the need for better dietary habits and hydration. She also reports high blood pressure readings recently. She has noticed increased urinary tract infections, attributing them to poor hygiene and incontinence. She also mentions a recent fall resulting in significant bruising and swelling, which has persisted for a month.        Past Medical History:  Diagnosis Date   Allergy 2010   Anxiety    Arthritis of right hip 10/28/2016   Basal cell carcinoma 11/17/2011   Dr. Naoma Diener   Cataract 2010   Cutaneous skin tags 07/24/2013   DDD (degenerative disc disease), cervical    s/p fusion   DDD (degenerative disc disease), thoracolumbar    on disability   Depression    Headache(784.0) 01/29/2013   Hearing loss    Saw ENT on 04/27/2017; Moderate hearing loss in both ears.   Heartburn    Hiatal hernia    HTN (hypertension) 2002   Hyperlipemia 08/2014   Hyperplastic colon polyp    Hypothyroid    Internal hemorrhoids    Obesity    Obesity 04/30/2017   Pain in joint, shoulder region 11/20/2013   Pancreatitis 04/24/2011   Pneumonia 07/24/2013   Preventative health care 10/28/2016   Preventative  health care 10/28/2016   Tachycardia 08/12/2014   Vitamin D deficiency 11/18/2015    Past Surgical History:  Procedure Laterality Date   ANTERIOR FUSION CERVICAL SPINE  12/22/2006   C6-7  Dr Wynetta Emery   CHOLECYSTECTOMY N/A 02/27/2013   Procedure: LAPAROSCOPIC CHOLECYSTECTOMY WITH INTRAOPERATIVE CHOLANGIOGRAM;  Surgeon: Ardeth Sportsman, MD;  Location: [facility];  Service: General;  Laterality: N/A;   POSTERIOR FUSION CERVICAL SPINE  12/22/2009   C6-7   SPINE SURGERY  2009    Family History  Problem Relation Age of Onset   Hypertension Mother    Hyperlipidemia Mother    Arthritis Mother    Hearing loss Mother    Other Father        DDD, colectomy,cardiac stent, afib   Colon polyps Father    Anxiety disorder Father    COPD Father        died due to complications from Covid-19   Depression Father    Diabetes Father    Heart disease Father    Hypertension Brother    Diabetes Maternal Grandmother    Diabetes Paternal Grandmother    Colon cancer Neg Hx    Stomach cancer Neg Hx    Kidney disease Neg Hx     Social History   Socioeconomic History   Marital status: Widowed    Spouse name: Not on file   Number of  children: 0   Years of education: Not on file   Highest education level: Not on file  Occupational History   Occupation: Disabled    Employer: UNEMPLOYED  Tobacco Use   Smoking status: Never   Smokeless tobacco: Never  Vaping Use   Vaping status: Never Used  Substance and Sexual Activity   Alcohol use: Yes    Alcohol/week: 5.0 standard drinks of alcohol    Types: 5 Cans of beer per week   Drug use: No   Sexual activity: Not Currently  Other Topics Concern   Not on file  Social History Narrative   Reviewed history and no changes required.   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   Regular exercise-no   Smoking Status:  quit   Caffeine use/day:  none   Does  Patient Exercise:  no         Social Determinants of Corporate investment banker Strain: Not on file  Food Insecurity: Not on file  Transportation Needs: Not on file  Physical Activity: Not on file  Stress: Not on file  Social Connections: Not on file  Intimate Partner Violence: Not on file    Outpatient Medications Prior to Visit  Medication Sig Dispense Refill   atorvastatin (LIPITOR) 40 MG tablet Take 1 tablet (40 mg total) by mouth daily. 90 tablet 1   COVID-19 mRNA vaccine 2023-2024 (COMIRNATY) syringe Inject into the muscle. 0.3 mL 0   diphenoxylate-atropine (LOMOTIL) 2.5-0.025 MG tablet TAKE ONE TO TWO TABLETS BY MOUTH FOUR TIMES DAILY AS NEEDED FOR DIARRHEA OR LOOSE STOOLS *MAX OF 8 TABLETS 24 HOURS* 30 tablet 1   levothyroxine (SYNTHROID) 50 MCG tablet TAKE 1 TABLET BY MOUTH DAILY BEFORE BREAKFAST 30 tablet 1   losartan (COZAAR) 25 MG tablet Take 1 tablet (25 mg total) by mouth daily. 90 tablet 1   pantoprazole (PROTONIX) 40 MG tablet TAKE 1 TABLET BY MOUTH DAILY 30 tablet 3   propranolol (INDERAL) 40 MG tablet Take 1 tablet (40 mg total) by mouth 2 (two) times daily. 180 tablet 1   sertraline (ZOLOFT) 100 MG tablet TAKE 2 TABLETS BY MOUTH EVERY MORNING 180 tablet 0   amitriptyline (ELAVIL) 25 MG tablet Take 1-2 tablets (25-50 mg total) by mouth at bedtime as needed for sleep. 60 tablet 1   diclofenac sodium (VOLTAREN) 1 % GEL Apply 4 g topically 4 (four) times daily as needed. 300 g 5   dicyclomine (BENTYL) 10 MG capsule TAKE ONE TO TWO CAPSULES BY MOUTH EVERY 6 HOURS AS NEEDED FOR SPASMS 120 capsule 2   Vitamin D, Ergocalciferol, (DRISDOL) 1.25 MG (50000 UNIT) CAPS capsule TAKE 1 CAPSULE BY MOUTH EVERY 7 DAYS 4 capsule 4   No facility-administered medications prior to visit.    Allergies  Allergen Reactions   Amoxicillin-Pot Clavulanate     REACTION: Throat swelling.   Azithromycin     REACTION: Throat Swelling   Cefazolin     REACTION: Throat Swelling    Moxifloxacin     REACTION: Throat Swelling   Penicillins     REACTION: throat swelling   Sulfonamide Derivatives     Review of Systems  Constitutional:  Negative for fever and malaise/fatigue.  HENT:  Negative for congestion.   Eyes:  Negative for blurred vision.  Respiratory:  Negative for shortness of breath.   Cardiovascular:  Negative for chest pain,  palpitations and leg swelling.  Gastrointestinal:  Negative for abdominal pain, blood in stool and nausea.  Genitourinary:  Negative for dysuria and frequency.  Musculoskeletal:  Positive for back pain and neck pain. Negative for falls.  Skin:  Negative for rash.  Neurological:  Negative for dizziness, loss of consciousness and headaches.  Endo/Heme/Allergies:  Negative for environmental allergies.  Psychiatric/Behavioral:  Negative for depression. The patient is nervous/anxious and has insomnia.        Objective:    Physical Exam Constitutional:      General: She is not in acute distress.    Appearance: Normal appearance. She is well-developed. She is not toxic-appearing.  HENT:     Head: Normocephalic and atraumatic.     Right Ear: External ear normal.     Left Ear: External ear normal.     Nose: Nose normal.  Eyes:     General:        Right eye: No discharge.        Left eye: No discharge.     Conjunctiva/sclera: Conjunctivae normal.  Neck:     Thyroid: No thyromegaly.  Cardiovascular:     Rate and Rhythm: Normal rate and regular rhythm.     Heart sounds: Normal heart sounds. No murmur heard. Pulmonary:     Effort: Pulmonary effort is normal. No respiratory distress.     Breath sounds: Normal breath sounds.  Abdominal:     General: Bowel sounds are normal.     Palpations: Abdomen is soft.     Tenderness: There is no abdominal tenderness. There is no guarding.  Musculoskeletal:        General: Normal range of motion.     Cervical back: Neck supple.  Lymphadenopathy:     Cervical: No cervical adenopathy.   Skin:    General: Skin is warm and dry.  Neurological:     Mental Status: She is alert and oriented to person, place, and time.  Psychiatric:        Mood and Affect: Mood normal.        Behavior: Behavior normal.        Thought Content: Thought content normal.        Judgment: Judgment normal.     BP 134/84 (BP Location: Right Arm, Patient Position: Sitting, Cuff Size: Small)   Pulse 82   Temp 98.7 F (37.1 C) (Oral)   Resp 16   Wt 208 lb (94.3 kg)   LMP 09/23/2011   SpO2 98%   BMI 32.58 kg/m  Wt Readings from Last 3 Encounters:  08/27/23 208 lb (94.3 kg)  02/23/23 214 lb (97.1 kg)  05/15/22 208 lb (94.3 kg)    Diabetic Foot Exam - Simple   No data filed    Lab Results  Component Value Date   WBC 7.7 08/27/2023   HGB 13.7 08/27/2023   HCT 42.8 08/27/2023   PLT 285.0 08/27/2023   GLUCOSE 85 08/27/2023   CHOL 175 08/27/2023   TRIG 167.0 (H) 08/27/2023   HDL 68.30 08/27/2023   LDLDIRECT 85.0 09/24/2022   LDLCALC 73 08/27/2023   ALT 27 08/27/2023   AST 21 08/27/2023   NA 141 08/27/2023   K 5.3 No hemolysis seen (H) 08/27/2023   CL 103 08/27/2023   CREATININE 0.61 08/27/2023   BUN 12 08/27/2023   CO2 30 08/27/2023   TSH 1.84 08/27/2023   HGBA1C 6.0 08/27/2023    Lab Results  Component Value Date   TSH 1.84 08/27/2023  Lab Results  Component Value Date   WBC 7.7 08/27/2023   HGB 13.7 08/27/2023   HCT 42.8 08/27/2023   MCV 92.0 08/27/2023   PLT 285.0 08/27/2023   Lab Results  Component Value Date   NA 141 08/27/2023   K 5.3 No hemolysis seen (H) 08/27/2023   CO2 30 08/27/2023   GLUCOSE 85 08/27/2023   BUN 12 08/27/2023   CREATININE 0.61 08/27/2023   BILITOT 0.5 08/27/2023   ALKPHOS 91 08/27/2023   AST 21 08/27/2023   ALT 27 08/27/2023   PROT 7.4 08/27/2023   ALBUMIN 4.3 08/27/2023   CALCIUM 9.9 08/27/2023   GFR 95.14 08/27/2023   Lab Results  Component Value Date   CHOL 175 08/27/2023   Lab Results  Component Value Date   HDL  68.30 08/27/2023   Lab Results  Component Value Date   LDLCALC 73 08/27/2023   Lab Results  Component Value Date   TRIG 167.0 (H) 08/27/2023   Lab Results  Component Value Date   CHOLHDL 3 08/27/2023   Lab Results  Component Value Date   HGBA1C 6.0 08/27/2023       Assessment & Plan:  Back pain, unspecified back location, unspecified back pain laterality, unspecified chronicity Assessment & Plan: Encouraged moist heat and gentle stretching as tolerated. May try NSAIDs and prescription meds as directed and report if symptoms worsen or seek immediate care.    Hyperglycemia Assessment & Plan: hgba1c acceptable, minimize simple carbs. Increase exercise as tolerated.   Orders: -     Hemoglobin A1c  Mixed hyperlipidemia Assessment & Plan: Encourage heart healthy diet such as MIND or DASH diet, increase exercise, avoid trans fats, simple carbohydrates and processed foods, consider a krill or fish or flaxseed oil cap daily.   Orders: -     Lipid panel  Vitamin D deficiency Assessment & Plan: Supplement and monitor   Orders: -     Comprehensive metabolic panel -     VITAMIN D 25 Hydroxy (Vit-D Deficiency, Fractures)  Hypothyroidism, unspecified type Assessment & Plan: On Levothyroxine, continue to monitor  Orders: -     TSH  Primary hypertension Assessment & Plan: Well controlled, no changes to meds. Encouraged heart healthy diet such as the DASH diet and exercise as tolerated.   Orders: -     CBC with Differential/Platelet  Cervical cancer screening Assessment & Plan: Per patient preference referred to OB/GYN for screening. MGM ordered for screening today  Orders: -     Ambulatory referral to Obstetrics / Gynecology  Encounter for screening mammogram for malignant neoplasm of breast -     3D Screening Mammogram, Left and Right; Future  Grief reaction -     Ambulatory referral to Behavioral Health  Recurrent major depressive disorder, in partial  remission (HCC) -     Ambulatory referral to Behavioral Health  Need for influenza vaccination -     Flu vaccine trivalent PF, 6mos and older(Flulaval,Afluria,Fluarix,Fluzone)  Other orders -     Amitriptyline HCl; Take 1 tablet (50 mg total) by mouth at bedtime as needed for sleep.  Dispense: 30 tablet; Refill: 3 -     Zolpidem Tartrate; Take 1 tablet (10 mg total) by mouth at bedtime as needed for sleep.  Dispense: 15 tablet; Refill: 3    Assessment and Plan    Insomnia Patient reports variable sleep patterns and is currently taking Amitriptyline 50mg  nightly. -Continue Amitriptyline 50mg  nightly.   COVID-19 Patient reports recent recovery from third  COVID-19 infection despite vaccination. -Plan to receive additional COVID-19 vaccine booster in November. -Administer influenza vaccine today.  Hypertension Blood pressure readings have been consistently in the 130s/80s, with one reading of 140/90. -Encourage lifestyle modifications including diet, exercise, and stress management. -Check blood pressure at home weekly. -Follow-up in 4-6 months to reassess.    Weight Loss Patient reports recent weight loss of 6 pounds through diet and exercise. -Encourage continuation of healthy lifestyle changes.  Ventricular Tachycardia Patient reports history of ventricular tachycardia and is under the care of a cardiologist. -Continue current management plan under cardiologist's care.  Constipation Patient reports occasional constipation. -Advise on milk of magnesia and prune juice regimen for severe constipation episodes.  General Health Maintenance / Followup Plans -Order mammogram and schedule appointment. -Plan for cervical cancer screening referral. -Check blood work today to assess vitamin D and cholesterol levels. -Schedule routine checkup for early 2025.         Danise Edge, MD

## 2023-09-02 ENCOUNTER — Other Ambulatory Visit: Payer: Self-pay | Admitting: Family Medicine

## 2023-09-04 ENCOUNTER — Encounter: Payer: Self-pay | Admitting: Family Medicine

## 2023-09-04 ENCOUNTER — Other Ambulatory Visit: Payer: Self-pay | Admitting: Family Medicine

## 2023-09-08 ENCOUNTER — Other Ambulatory Visit: Payer: Self-pay | Admitting: Family Medicine

## 2023-09-08 ENCOUNTER — Encounter (HOSPITAL_BASED_OUTPATIENT_CLINIC_OR_DEPARTMENT_OTHER): Payer: Self-pay

## 2023-09-08 ENCOUNTER — Ambulatory Visit (HOSPITAL_BASED_OUTPATIENT_CLINIC_OR_DEPARTMENT_OTHER)
Admission: RE | Admit: 2023-09-08 | Discharge: 2023-09-08 | Disposition: A | Discharge status: Home / Self Care | Payer: 59 | Source: Ambulatory Visit | Attending: Family Medicine | Admitting: Family Medicine

## 2023-09-08 DIAGNOSIS — Z1231 Encounter for screening mammogram for malignant neoplasm of breast: Secondary | ICD-10-CM | POA: Diagnosis present

## 2023-09-11 ENCOUNTER — Other Ambulatory Visit: Payer: Self-pay | Admitting: Family Medicine

## 2023-09-14 ENCOUNTER — Encounter: Payer: Self-pay | Admitting: Family Medicine

## 2023-09-14 ENCOUNTER — Other Ambulatory Visit: Payer: Self-pay | Admitting: Family Medicine

## 2023-09-14 MED ORDER — DIPHENOXYLATE-ATROPINE 2.5-0.025 MG PO TABS: ORAL_TABLET | ORAL | 1 refills | Status: DC

## 2023-09-24 ENCOUNTER — Other Ambulatory Visit: Payer: Self-pay | Admitting: Family

## 2023-09-28 ENCOUNTER — Ambulatory Visit: Payer: 59 | Admitting: Behavioral Health

## 2023-09-28 DIAGNOSIS — F321 Major depressive disorder, single episode, moderate: Secondary | ICD-10-CM

## 2023-09-28 NOTE — Progress Notes (Signed)
                Anastasya Jewell L [name], LMFT 

## 2023-10-05 NOTE — Progress Notes (Addendum)
[facility] Counselor Initial Adult Exam  Name: Kathy Huerta Date: 10/03/2023 MRN: [id number] DOB: 09-05-1961 PCP: Bradd Canary, MD  Time spent: 60 min In Person @ [facility] - [facility] Office Time In: 2:00pm Time Out: 3:00pm  Guardian/Payee:  [employer] requested: No   Reason for Visit /Presenting Problem: Elevated anx/dep & stress due to prolonged grief over death of Husb Hassie Bruce in Feb 01, 2021 Mental Status Exam: Appearance:   Casual     Behavior:  Appropriate and Sharing  Motor:  Normal  Speech/Language:   Clear and Coherent and Normal Rate  Affect:  Appropriate  Mood:  normal  Thought process:  normal  Thought content:    WNL  Sensory/Perceptual disturbances:    WNL  Orientation:  oriented to person, place, time/date, and situation  Attention:  Good  Concentration:  Good  Memory:  WNL  Fund of knowledge:   Good  Insight:    Good  Judgment:   Good  Impulse Control:  Good    Risk Assessment: Danger to Self:  No Self-injurious Behavior: No Danger to Others: No Duty to Warn:no Physical Aggression / Violence:No  Access to Firearms a concern: No  Gang Involvement:No  Patient / guardian was educated about steps to take if suicide or homicide risk level increases between visits: yes; appropriate to ICD process While future psychiatric events cannot be accurately predicted, the patient does not currently require acute inpatient psychiatric care and does not currently meet [facility] involuntary commitment criteria.  Substance Abuse History: Current substance abuse: No     Past Psychiatric History:   No previous psychological problems have been observed Outpatient Providers: Danise Edge, MD History of Psych Hospitalization: No  Psychological Testing:  NA    Abuse History:  Victim of: No.,  NA    Report needed: No. Victim of Neglect:No. Perpetrator of  NA   Witness / Exposure to Domestic Violence: No   Protective  Services Involvement: No  Witness to [employer] Violence:  No   Family History:  Family History  Problem Relation Age of Onset   Hypertension Mother    Hyperlipidemia Mother    Arthritis Mother    Hearing loss Mother    Other Father        DDD, colectomy,cardiac stent, afib   Colon polyps Father    Anxiety disorder Father    COPD Father        died due to complications from Covid-19   Depression Father    Diabetes Father    Heart disease Father    Hypertension Brother    Diabetes Maternal Grandmother    Diabetes Paternal Grandmother    Colon cancer Neg Hx    Stomach cancer Neg Hx    Kidney disease Neg Hx     Living situation: the patient lives alone  Sexual Orientation: Straight  Relationship Status: widowed  Name of spouse / other: Hassie Bruce If a parent, number of children / ages: NA  Support Systems: lives alone 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 She is not sleeping well & her stomach is bothering her.    Strengths: Supportive Relationships, Family, Hopefulness, Self Advocate, Able to Communicate Effectively, and Pt realizes she needs extra help to move through this time.   Barriers:  None noted today   Legal History: Pending legal issue / charges: The patient has no significant history of legal issues. History of legal issue / charges:  NA  Medical History/Surgical History: reviewed Past Medical History:  Diagnosis Date   Allergy 2010    Anxiety    Arthritis of right hip 10/28/2016   Basal cell carcinoma 11/17/2011   Dr. Naoma Diener   Cataract 2010   Cutaneous skin tags 07/24/2013   DDD (degenerative disc disease), cervical    s/p fusion   DDD (degenerative disc disease), thoracolumbar    on disability   Depression    Headache(784.0) 01/29/2013   Hearing loss    Saw ENT on 04/27/2017; Moderate hearing loss in both ears.   Heartburn    Hiatal hernia    HTN (hypertension) 2002   Hyperlipemia 08/2014   Hyperplastic colon polyp    Hypothyroid    Internal hemorrhoids    Obesity    Obesity 04/30/2017   Pain in joint, shoulder region 11/20/2013   Pancreatitis 04/24/2011   Pneumonia 07/24/2013   Preventative health care 10/28/2016   Preventative health care 10/28/2016   Tachycardia 08/12/2014   Vitamin D deficiency 11/18/2015    Past Surgical History:  Procedure Laterality Date   ANTERIOR FUSION CERVICAL SPINE  12/22/2006   C6-7  Dr Wynetta Emery   CHOLECYSTECTOMY N/A 02/27/2013   Procedure: LAPAROSCOPIC CHOLECYSTECTOMY WITH INTRAOPERATIVE CHOLANGIOGRAM;  Surgeon: Ardeth Sportsman, MD;  Location: [facility];  Service: General;  Laterality: N/A;   POSTERIOR FUSION CERVICAL SPINE  12/22/2009   C6-7   SPINE SURGERY  2009    Medications: Current Outpatient Medications  Medication Sig Dispense Refill   amitriptyline (ELAVIL) 50 MG tablet Take 1 tablet (50 mg total) by mouth at bedtime as needed for sleep. 30 tablet 3   atorvastatin (LIPITOR) 40 MG tablet Take 1 tablet (40 mg total) by mouth daily. 90 tablet 1   COVID-19 mRNA vaccine 2023-2024 (COMIRNATY) syringe Inject into the muscle. 0.3 mL 0   diphenoxylate-atropine (LOMOTIL) 2.5-0.025 MG tablet TAKE ONE TO TWO TABLETS BY MOUTH FOUR TIMES DAILY AS NEEDED FOR DIARRHEA OR LOOSE STOOLS *MAX OF 8 TABLETS 24 HOURS* 30 tablet 1   levothyroxine (SYNTHROID) 50 MCG tablet TAKE 1 TABLET BY MOUTH DAILY BEFORE BREAKFAST 30 tablet 1   losartan (COZAAR) 25 MG tablet Take 1 tablet (25 mg  total) by mouth daily. 90 tablet 1   pantoprazole (PROTONIX) 40 MG tablet TAKE 1 TABLET BY MOUTH DAILY 30 tablet 3   propranolol (INDERAL) 40 MG tablet Take 1 tablet (40 mg total) by mouth 2 (two) times daily. 180 tablet 1   sertraline (ZOLOFT) 100 MG tablet TAKE 2 TABLETS BY MOUTH EVERY MORNING 60 tablet 1   Vitamin D, Ergocalciferol, (DRISDOL) 1.25 MG (50000 UNIT) CAPS capsule TAKE 1 CAPSULE BY MOUTH EVERY 7 DAYS 4 capsule 0   zolpidem (AMBIEN) 10 MG tablet Take 1 tablet (10 mg total) by mouth at bedtime as needed for sleep. 15 tablet 3   No current facility-administered medications for this visit.    Allergies  Allergen Reactions   Amoxicillin-Pot Clavulanate     REACTION: Throat swelling.  Azithromycin     REACTION: Throat Swelling   Cefazolin     REACTION: Throat Swelling   Moxifloxacin     REACTION: Throat Swelling   Penicillins     REACTION: throat swelling   Sulfonamide Derivatives     Diagnoses:  Current moderate episode of major depressive disorder without prior episode [facility])  Plan of Care: Juanesha will keep a Notebook btwn sessions to record her sleep patterns & to write down thoughts, feelings, & anxieties she is exp'g with Family. She has been a Engineer, structural for many yrs & the death of her Husb has re-ignited her previous losses in a hurtful way. She will use this Notebook to keep track of her thoughts & remind herself how valuable, giving, & loving of a person she is in her world & w/in her Family of 8 close relatives.  Target Date: 10/21/2023  Progress: 2  Frequency: Once every 2-3 wks  Modality: Claretta Fraise, LMFT

## 2023-10-06 ENCOUNTER — Other Ambulatory Visit: Payer: Self-pay | Admitting: Family Medicine

## 2023-10-12 ENCOUNTER — Ambulatory Visit: Payer: 59 | Admitting: Behavioral Health

## 2023-10-12 DIAGNOSIS — F4329 Adjustment disorder with other symptoms: Secondary | ICD-10-CM | POA: Diagnosis not present

## 2023-10-12 DIAGNOSIS — F321 Major depressive disorder, single episode, moderate: Secondary | ICD-10-CM

## 2023-10-12 NOTE — Progress Notes (Signed)
                Anastasya Jewell L [name], LMFT 

## 2023-10-12 NOTE — Progress Notes (Signed)
[facility] Counselor/Therapist Progress Note  Patient ID: MARRAH Huerta, MRN: [id number],    Date: 10/12/2023  Time Spent: 55 min In Person @ [facility] - [facility] Office Time In: 11:00am Time Out: 11:55am   Treatment Type: Individual Therapy  Reported Symptoms: Elevated anx/dep & stress due to Caregiving for Mother in situations that have been triggering for Pt. Elevated grief rxn due to Mother's poor health.   Mental Status Exam: Appearance:  Casual     Behavior: Appropriate and Sharing  Motor: Normal  Speech/Language:  Clear and Coherent  Affect: Appropriate  Mood: normal  Thought process: normal  Thought content:   WNL  Sensory/Perceptual disturbances:   WNL  Orientation: oriented to person, place, time/date, and situation  Attention: Good  Concentration: Good  Memory: WNL  Fund of knowledge:  Good  Insight:   Good  Judgment:  Good  Impulse Control: Good   Risk Assessment: Danger to Self:  No Self-injurious Behavior: No Danger to Others: No Duty to Warn:no Physical Aggression / Violence:No  Access to Firearms a concern: No  Gang Involvement:No   Subjective: Pt is not getting adequate sleep. She is waking early & unable to fall back asleep. 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   Pt is currently caring for her Mother who has Aides to help her during the day. Recently, Pt rushed to her Mother when she slipped out of a recliner. She drove to the home & when she could not stop shaking to open the door-this worried her considerably. She has not been back to herself since last Wed & her Family visiting noticed she is in bad shape.   Interventions: Grief Therapy, Psycho-education/Bibliotherapy, and Family Systems  Diagnosis:Current moderate episode of major depressive disorder without prior episode (HCC)  Prolonged grief reaction  Plan: Kathy Huerta will cont to give herself grace, self-nurturing, & BOD as she tries to care  for herself & not always everyone else. She will cont to write in her Notebook & bring the issues into psychotherapy that concern her.  Target Date: 11/06/2023  Progress: 5  Frequency: Once every 2-3 wks  Modality: Claretta Fraise, LMFT

## 2023-10-13 ENCOUNTER — Other Ambulatory Visit: Payer: Self-pay | Admitting: Family Medicine

## 2023-11-03 ENCOUNTER — Ambulatory Visit: Payer: 59 | Admitting: Behavioral Health

## 2023-11-03 DIAGNOSIS — F4381 Prolonged grief disorder: Secondary | ICD-10-CM | POA: Diagnosis not present

## 2023-11-03 DIAGNOSIS — F4329 Adjustment disorder with other symptoms: Secondary | ICD-10-CM

## 2023-11-03 DIAGNOSIS — F321 Major depressive disorder, single episode, moderate: Secondary | ICD-10-CM

## 2023-11-03 NOTE — Progress Notes (Signed)
[facility] Counselor/Therapist Progress Note  Patient ID: NEKETA LACE, MRN: [id number],    Date: 11/05/2023  Time Spent: 55 min In Person @ [facility]- [facility] Office Time In: 1:00pm Time Out: 1:55pm   Treatment Type: Individual Therapy  Reported Symptoms: Elevated anx/dep & stress due to the recent election & an event invl'g her Mother & voting that became upsetting. Pt sts she started crying again 2 wks ago & she does not like this daily event, oftentimes much of the day for "no reason".  Mental Status Exam: Appearance:  Casual     Behavior: Appropriate and Sharing  Motor: Normal  Speech/Language:  Clear and Coherent  Affect: Appropriate  Mood: anxious and sad  Thought process: normal  Thought content:   WNL  Sensory/Perceptual disturbances:   WNL  Orientation: oriented to person, place, time/date, and situation  Attention: Good  Concentration: Good  Memory: WNL  Fund of knowledge:  Good  Insight:   Good  Judgment:  Good  Impulse Control: Good   Risk Assessment: Danger to Self:  No Self-injurious Behavior: No Danger to Others: No Duty to Warn:no Physical Aggression / Violence:No  Access to Firearms a concern: No  Gang Involvement:No   Subjective: Pt is tearful today describing her recent return to being tearful. She has been on a constant Caregiving routine for almost 5 yrs. She cared for her Dad & Husb prior to death & now her Mother is 82yo & not doing well. This culmination of losses has been very difficult. The Holidays are esp'ly challenging since her Dad, Husb Les, & now her Mother will not be tgthr. She is trying to adjust to this situation & how it will be a struggle for her w/out her Husb & Fr.   Pt has asked for a medication to assist her gut troubles. She will have diarrhea acutely & quite suddenly. This dictates where she goes, what she does, & how she manages being around other ppl. It has been debilitating.  Her PCP has provided a medication that she  can use in anticipation of her Holiday travels & events, & daily use to prevent her from having accidents & this gives her confidence going out & doing activities she would shy away from due to her gut.   Interventions: Grief Therapy, Insight-Oriented, and Family Systems  Diagnosis:Current moderate episode of major depressive disorder without prior episode (HCC)  Prolonged grief reaction  Plan: Promoted extensive psychoedu re: tears & the grief process. 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 Provided examples in session of Nikol putting everyone else in her life first, w/no thought to herself. Normalized & validated her human nature & need to care for her Family. Assisted & encouraged Sierah to put herself first in small & comfortable ways while she cares for her Mother. Mikeria will use these suggestions & report back @ our next session on her levels of anx/dep & stress.  Target Date: 12/06/2023  Progress: 5  Frequency: Once every 2-3 wks  Modality: Kitti Doornbos will take small steps to get out into public. She will go to [employer], shop for groceries, walk in the neighborhood, & generally do things around ppl, but not necessarily w/them to get the social time she needs at present w/out it being overwhelming. Target Date: 11/26/2023  Progress: 6  Frequency: Once  every 2-3 wks  Modality: Claretta Fraise, LMFT

## 2023-11-03 NOTE — Progress Notes (Signed)
                Kathy Jewell L [name], LMFT 

## 2023-11-09 ENCOUNTER — Other Ambulatory Visit: Payer: Self-pay | Admitting: Family Medicine

## 2023-11-24 ENCOUNTER — Ambulatory Visit: Payer: 59 | Admitting: Behavioral Health

## 2023-12-03 ENCOUNTER — Other Ambulatory Visit (HOSPITAL_BASED_OUTPATIENT_CLINIC_OR_DEPARTMENT_OTHER): Payer: Self-pay

## 2023-12-03 MED ORDER — COMIRNATY 30 MCG/0.3ML IM SUSY
0.3000 mL | PREFILLED_SYRINGE | Freq: Once | INTRAMUSCULAR | 0 refills | Status: AC
  Filled 2023-12-03: qty 0.3, 1d supply, fill #0

## 2023-12-08 ENCOUNTER — Ambulatory Visit: Payer: 59 | Admitting: Behavioral Health

## 2023-12-08 DIAGNOSIS — F4321 Adjustment disorder with depressed mood: Secondary | ICD-10-CM

## 2023-12-08 DIAGNOSIS — F321 Major depressive disorder, single episode, moderate: Secondary | ICD-10-CM | POA: Diagnosis not present

## 2023-12-08 NOTE — Progress Notes (Signed)
[facility] Counselor/Therapist Progress Note  Patient ID: Kathy Huerta, MRN: [id number],    Date: 12/08/2023  Time Spent: 55 min In Person @ [facility] - [facility] Office Time In: 2:00pm Time Out: 2:55pm   Treatment Type: Individual Therapy  Reported Symptoms: Intense grief rxn this Holiday Season due to Husb's death this past February 07, 2023 after a month of Hospitalization in the ICU  Mental Status Exam: Appearance:  Casual     Behavior: Appropriate and Sharing  Motor: Normal  Speech/Language:  Clear and Coherent and Normal Rate  Affect: Appropriate  Mood: sad  Thought process: normal  Thought content:   WNL  Sensory/Perceptual disturbances:   WNL  Orientation: oriented to person, place, time/date, and situation  Attention: Good  Concentration: Good  Memory: WNL  Fund of knowledge:  Good  Insight:   Good  Judgment:  Good  Impulse Control: Good   Risk Assessment: Danger to Self:  No Self-injurious Behavior: No Danger to Others: No Duty to Warn:no Physical Aggression / Violence:No  Access to Firearms a concern: No  Gang Involvement:No   Subjective: Pt has been crying daily for over a month. She is concerned for this, but not debilitated by it. She is remembering her exp last year during the month of Dec when Husb Les was hosp'zd from Dec 2 to the first week in Feb 07, 2023 when he was taken off the Vent & died. This Season has been full of Caregiving duties for her Mother who is 79yo & lives in [place].   She enjoyed Thanksgiving @ her Mother's home w/her Bros & SIL, who cooked the entire meal. She also visited w/her Niece Megan & Husb Dan & their two children; 3yo Ava & 5 mos old infant Emmet.  Pt has exp'd little sleep in the past few nights & heard her Husb call her name loudly in her ear. She thinks this is odd, but also wants to listen for him more. She is open to watching & listening for him. 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 Her TV is also coming on @ night w/o her using the remote.  Interventions: Grief Therapy; normalized crying/tears & validated Pt sensitivity to her Husb's spirit @ this time. She is alone in a big home & misses him greatly.  Diagnosis:Current moderate episode of major depressive disorder without prior episode (HCC)  Grief reaction  Plan: Kamaljit will cont to try & do small & comfortable things for herself as self-nurturing. She will get out in public more, even if she does not interact w/others. She will try to record some in a Notebook to clear her head before bed so she can sleep.  Target Date: 01/06/2024  Progress: 6  Frequency: Once every 2-3 wks  Modality: Claretta Fraise, LMFT

## 2023-12-08 NOTE — Progress Notes (Signed)
                Anastasya Jewell L [name], LMFT 

## 2023-12-15 ENCOUNTER — Other Ambulatory Visit: Payer: Self-pay | Admitting: Family Medicine

## 2023-12-15 NOTE — Telephone Encounter (Signed)
Requesting:Ambien 10mg   Contract: 03/10/23 UDS: None Last Visit: 08/27/23 Next Visit: 02/25/24 Last Refill: 08/27/23 #15 and 3RF   Please Advise

## 2023-12-20 ENCOUNTER — Other Ambulatory Visit: Payer: Self-pay | Admitting: Family Medicine

## 2023-12-22 ENCOUNTER — Ambulatory Visit: Payer: 59 | Admitting: Behavioral Health

## 2023-12-22 DIAGNOSIS — F4321 Adjustment disorder with depressed mood: Secondary | ICD-10-CM | POA: Diagnosis not present

## 2023-12-22 DIAGNOSIS — F321 Major depressive disorder, single episode, moderate: Secondary | ICD-10-CM | POA: Diagnosis not present
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
## 2023-12-22 NOTE — Progress Notes (Signed)
                Anastasya Jewell L [name], LMFT 

## 2023-12-28 ENCOUNTER — Other Ambulatory Visit: Payer: Self-pay | Admitting: Family Medicine

## 2023-12-29 NOTE — Telephone Encounter (Signed)
 Requesting: Lomotil  Contract: n/a UDS: n/a  Last Visit: 08/27/23 Next Visit: 02/25/24 Last Refill: 09/14/23 #30 and 1RF   Please Advise

## 2024-01-04 ENCOUNTER — Other Ambulatory Visit: Payer: Self-pay | Admitting: Family Medicine

## 2024-01-06 ENCOUNTER — Ambulatory Visit: Payer: 59 | Admitting: Behavioral Health

## 2024-01-06 ENCOUNTER — Other Ambulatory Visit: Payer: Self-pay | Admitting: Family Medicine

## 2024-01-06 DIAGNOSIS — F4381 Prolonged grief disorder: Secondary | ICD-10-CM

## 2024-01-06 DIAGNOSIS — F321 Major depressive disorder, single episode, moderate: Secondary | ICD-10-CM

## 2024-01-06 DIAGNOSIS — F4329 Adjustment disorder with other symptoms: Secondary | ICD-10-CM
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
## 2024-01-06 NOTE — Progress Notes (Signed)
Deneise Lever, LMFT

## 2024-01-20 ENCOUNTER — Ambulatory Visit: Payer: 59 | Admitting: Behavioral Health

## 2024-01-20 DIAGNOSIS — F321 Major depressive disorder, single episode, moderate: Secondary | ICD-10-CM | POA: Diagnosis not present

## 2024-01-20 DIAGNOSIS — F4329 Adjustment disorder with other symptoms: Secondary | ICD-10-CM | POA: Diagnosis not present

## 2024-01-20 NOTE — Progress Notes (Signed)
Deneise Lever, LMFT
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
## 2024-01-26 ENCOUNTER — Ambulatory Visit: Payer: 59 | Admitting: Behavioral Health

## 2024-02-02 ENCOUNTER — Ambulatory Visit: Payer: 59 | Admitting: Behavioral Health

## 2024-02-02 DIAGNOSIS — F4329 Adjustment disorder with other symptoms: Secondary | ICD-10-CM

## 2024-02-02 DIAGNOSIS — F321 Major depressive disorder, single episode, moderate: Secondary | ICD-10-CM | POA: Diagnosis not present

## 2024-02-02 NOTE — Progress Notes (Signed)
   Kathy Lever, LMFT

## 2024-02-16 ENCOUNTER — Ambulatory Visit: Payer: 59 | Admitting: Behavioral Health

## 2024-02-16 DIAGNOSIS — F4329 Adjustment disorder with other symptoms: Secondary | ICD-10-CM

## 2024-02-16 DIAGNOSIS — F4321 Adjustment disorder with depressed mood: Secondary | ICD-10-CM

## 2024-02-16 DIAGNOSIS — F321 Major depressive disorder, single episode, moderate: Secondary | ICD-10-CM | POA: Diagnosis not present

## 2024-02-16 NOTE — Progress Notes (Unsigned)
 [facility] Counselor/Therapist Progress Note  Patient ID: Kathy Huerta, MRN: [id number],    Date: 02/16/2024  Time Spent: 55 min Phone visit. Pt is home in private & Provider working remotely from [facility]. Pt understands & is aware of the risks/limitations of telehealth & consents to Tx today.  Time In: 2:00pm Time Out: 2:55pm   Treatment Type: Individual Therapy  Reported Symptoms: Elevated anx/dep & grief rxn  Mental Status Exam: Appearance:  Casual     Behavior: Appropriate and Sharing  Motor: Normal  Speech/Language:  Clear and Coherent  Affect: Appropriate  Mood: normal  Thought process: normal  Thought content:   WNL  Sensory/Perceptual disturbances:   WNL  Orientation: oriented to person, place, time/date, and situation  Attention: Good  Concentration: Good  Memory: WNL  Fund of knowledge:  Good  Insight:   Good  Judgment:  Good  Impulse Control: Good   Risk Assessment: Danger to Self:  No Self-injurious Behavior: No Danger to Others: No Duty to Warn:no Physical Aggression / Violence:No  Access to Firearms a concern: No  Gang Involvement:No   Subjective: Pt is home today sick w/an unpredictable, upset stomach. She changed to Phone visit. She is navigating her time on the phone w/a close friend who lost her Husb 2 mos ago. They have been speaking often due to the similarity of both having lost Husbands, but Pt has decided she has to limit these contacts as they make her sad.   Pt is also working on saying No! & having inc'd boundaries w/other ppl who do not assist her in positive ways. Finding & setting boundaries is new for Pt & it is taking some practice. Today was an example of her caring for herself by switching to Phone.  Pt has Caregiver Burden from caring for her Mother who lives @ [employer]; last week she was in the ED for a whole day tending to her after a fall in her Apt when she was alone. Pt will make herself aware of the  contingency plan [place] has in place so she can be fully aware.   Interventions: Grief Therapy and Insight-Oriented  Diagnosis:Current moderate episode of major depressive disorder without prior episode (HCC)  Prolonged grief reaction  Grief reaction  Plan: Clementina will cont to advocate for herself in all ways by setting firm, but fair boundaries for the others whom she is in contact with & realize this serves the other person also.   Target Date: 03/05/2024  Progress: 6  Frequency: Once every 2-3 wks  Modality: Claretta Fraise, LMFT

## 2024-02-16 NOTE — Progress Notes (Unsigned)
   Kathy Lever, LMFT

## 2024-02-21 NOTE — Assessment & Plan Note (Signed)
 On Levothyroxine, continue to monitor

## 2024-02-21 NOTE — Assessment & Plan Note (Signed)
 Patient encouraged to maintain heart healthy diet, regular exercise, adequate sleep. Consider daily probiotics. Take medications as prescribed. Labs ordered and reviewed. Given and reviewed copy of ACP documents from [facility] Secretary of State and encouraged to complete and return  Pap 2017, patient in too much pain today. She will pursue when feeling better.  MGM 08/2023 repeat every 1-2 years Colonoscopy 2020 needs repeat colonoscopy Dexa 2023  Recommend Prevnar 20, annual covid and flu vaccine

## 2024-02-21 NOTE — Assessment & Plan Note (Signed)
 Well controlled, no changes to meds. Encouraged heart healthy diet such as the DASH diet and exercise as tolerated.

## 2024-02-21 NOTE — Assessment & Plan Note (Signed)
 [id number] acceptable, minimize simple carbs. Increase exercise as tolerated.

## 2024-02-21 NOTE — Assessment & Plan Note (Signed)
 Encourage heart healthy diet such as MIND or DASH diet, increase exercise, avoid trans fats, simple carbohydrates and processed foods, consider a krill or fish or flaxseed oil cap daily.

## 2024-02-25 ENCOUNTER — Ambulatory Visit: Payer: 59 | Admitting: Family Medicine

## 2024-02-25 ENCOUNTER — Encounter: Payer: Self-pay | Admitting: Family Medicine

## 2024-02-25 VITALS — BP 112/78 | HR 71 | Temp 97.8°F | Resp 16 | Ht 67.0 in | Wt 206.0 lb

## 2024-02-25 DIAGNOSIS — I10 Essential (primary) hypertension: Secondary | ICD-10-CM | POA: Diagnosis not present

## 2024-02-25 DIAGNOSIS — E559 Vitamin D deficiency, unspecified: Secondary | ICD-10-CM | POA: Diagnosis not present

## 2024-02-25 DIAGNOSIS — E782 Mixed hyperlipidemia: Secondary | ICD-10-CM

## 2024-02-25 DIAGNOSIS — Z0001 Encounter for general adult medical examination with abnormal findings: Secondary | ICD-10-CM

## 2024-02-25 DIAGNOSIS — Z860100 Personal history of colon polyps, unspecified: Secondary | ICD-10-CM

## 2024-02-25 DIAGNOSIS — E039 Hypothyroidism, unspecified: Secondary | ICD-10-CM | POA: Diagnosis not present

## 2024-02-25 DIAGNOSIS — R739 Hyperglycemia, unspecified: Secondary | ICD-10-CM

## 2024-02-25 DIAGNOSIS — Z0000 Encounter for general adult medical examination without abnormal findings: Secondary | ICD-10-CM

## 2024-02-25 DIAGNOSIS — R197 Diarrhea, unspecified: Secondary | ICD-10-CM

## 2024-02-25 DIAGNOSIS — R109 Unspecified abdominal pain: Secondary | ICD-10-CM | POA: Diagnosis not present

## 2024-02-25 MED ORDER — HYOSCYAMINE SULFATE 0.125 MG SL SUBL: 0.1250 mg | SUBLINGUAL_TABLET | SUBLINGUAL | 0 refills | Status: DC | PRN

## 2024-02-25 MED ORDER — VENLAFAXINE HCL ER 37.5 MG PO CP24
37.5000 mg | ORAL_CAPSULE | Freq: Every day | ORAL | 0 refills | Status: DC
Start: 2024-02-25 — End: 2024-05-10

## 2024-02-25 MED ORDER — VENLAFAXINE HCL ER 75 MG PO CP24
75.0000 mg | ORAL_CAPSULE | Freq: Every day | ORAL | 3 refills | Status: DC
Start: 2024-02-25 — End: 2024-06-27

## 2024-02-25 MED ORDER — SERTRALINE HCL 100 MG PO TABS: 100.0000 mg | ORAL_TABLET | Freq: Every morning | ORAL | 1 refills | Status: DC

## 2024-02-25 NOTE — Patient Instructions (Addendum)
 Week #1: start the Venlafaxine 37.5 mg capsule one daily and continue the Sertraline 200 mg daily Week #2: drop the Sertraline to 100 mg daily and increase the Venlafaxine to 75 mg daily  Prevnar 20 vaccination  Preventive Care 62-64 Years Old, Female Preventive care refers to lifestyle choices and visits with your health care provider that can promote health and wellness. Preventive care visits are also called wellness exams. What can I expect for my preventive care visit? Counseling Your health care provider may ask you questions about your: Medical history, including: Past medical problems. Family medical history. Pregnancy history. Current health, including: Menstrual cycle. Method of birth control. Emotional well-being. Home life and relationship well-being. Sexual activity and sexual health. Lifestyle, including: Alcohol, nicotine or tobacco, and drug use. Access to firearms. Diet, exercise, and sleep habits. Work and work Astronomer. Sunscreen use. Safety issues such as seatbelt and bike helmet use. Physical exam Your health care provider will check your: Height and weight. These may be used to calculate your BMI (body mass index). BMI is a measurement that tells if you are at a healthy weight. Waist circumference. This measures the distance around your waistline. This measurement also tells if you are at a healthy weight and may help predict your risk of certain diseases, such as type 2 diabetes and high blood pressure. Heart rate and blood pressure. Body temperature. Skin for abnormal spots. What immunizations do I need?  Vaccines are usually given at various ages, according to a schedule. Your health care provider will recommend vaccines for you based on your age, medical history, and lifestyle or other factors, such as travel or where you work. What tests do I need? Screening Your health care provider may recommend screening tests for certain conditions. This may  include: Lipid and cholesterol levels. Diabetes screening. This is done by checking your blood sugar (glucose) after you have not eaten for a while (fasting). Pelvic exam and Pap test. Hepatitis B test. Hepatitis C test. HIV (human immunodeficiency virus) test. STI (sexually transmitted infection) testing, if you are at risk. Lung cancer screening. Colorectal cancer screening. Mammogram. Talk with your health care provider about when you should start having regular mammograms. This may depend on whether you have a family history of breast cancer. BRCA-related cancer screening. This may be done if you have a family history of breast, ovarian, tubal, or peritoneal cancers. Bone density scan. This is done to screen for osteoporosis. Talk with your health care provider about your test results, treatment options, and if necessary, the need for more tests. Follow these instructions at home: Eating and drinking  Eat a diet that includes fresh fruits and vegetables, whole grains, lean protein, and low-fat dairy products. Take vitamin and mineral supplements as recommended by your health care provider. Do not drink alcohol if: Your health care provider tells you not to drink. You are pregnant, may be pregnant, or are planning to become pregnant. If you drink alcohol: Limit how much you have to 0-1 drink a day. Know how much alcohol is in your drink. In the [place] one drink equals one 12 oz bottle of beer (355 mL), one 5 oz glass of wine (148 mL), or one 1 oz glass of hard liquor (44 mL). Lifestyle Brush your teeth every morning and night with fluoride toothpaste. Floss one time each day. Exercise for at least 30 minutes 5 or more days each week. Do not use any products that contain nicotine or tobacco. These products include cigarettes,  chewing tobacco, and vaping devices, such as e-cigarettes. If you need help quitting, ask your health care provider. Do not use drugs. If you are sexually  active, practice safe sex. Use a condom or other form of protection to prevent STIs. If you do not wish to become pregnant, use a form of birth control. If you plan to become pregnant, see your health care provider for a prepregnancy visit. Take aspirin only as told by your health care provider. Make sure that you understand how much to take and what form to take. Work with your health care provider to find out whether it is safe and beneficial for you to take aspirin daily. Find healthy ways to manage stress, such as: Meditation, yoga, or listening to music. Journaling. Talking to a trusted person. Spending time with friends and family. Minimize exposure to UV radiation to reduce your risk of skin cancer. Safety Always wear your seat belt while driving or riding in a vehicle. Do not drive: If you have been drinking alcohol. Do not ride with someone who has been drinking. When you are tired or distracted. While texting. If you have been using any mind-altering substances or drugs. Wear a helmet and other protective equipment during sports activities. If you have firearms in your house, make sure you follow all gun safety procedures. Seek help if you have been physically or sexually abused. What's next? Visit your health care provider once a year for an annual wellness visit. Ask your health care provider how often you should have your eyes and teeth checked. Stay up to date on all vaccines. This information is not intended to replace advice given to you by your health care provider. Make sure you discuss any questions you have with your health care provider. Document Revised: 06/05/2021 Document Reviewed: 06/05/2021 Elsevier Patient Education  2024 [employer].

## 2024-02-25 NOTE — Progress Notes (Signed)
 Subjective:    Patient ID: RUMOR SUN, female    DOB: [birth year], 63 y.o.   MRN: [id number]  Chief Complaint  Patient presents with  . Annual Exam    HPI Discussed the use of AI scribe software for clinical note transcription with the patient, who gave verbal consent to proceed.  History of Present Illness Kathy Huerta is a 63 year old female who presents with frequent diarrhea and abdominal cramping.  She experiences frequent diarrhea and abdominal cramping, which she associates with stress. She has to run to the bathroom multiple times a day, and the condition has worsened over the years. Emotional upset triggers her symptoms, and she feels that her gastrointestinal issues are connected to her mental health struggles, including depression and grief.  She is currently on venlafaxine 37.5 mg daily and sertraline 200 mg daily for her mental health. She plans to adjust her medication regimen over the coming weeks. She feels that her mental health struggles, including delayed depression and grief, are contributing to her gastrointestinal symptoms.  She is taking care of her 34 year old mother, who lives in a retirement community. This responsibility adds to her stress, as her mother refuses to move to assisted living due to her attachment to her dog. Her mother has had surgeries for a broken clavicle and leg, which has further complicated the situation. She feels overwhelmed by the caregiving duties and the challenges with the retirement home's staff.  She reports a history of lymph node swelling, which occasionally becomes painful. The lymph node is larger than the other side but not significantly so. She has not had an ultrasound for this issue yet.  She mentions feeling dehydrated, which she attributes to her diarrhea and possibly insufficient water intake. She drinks iced tea mixed with water but acknowledges that she may need to increase her water consumption.    Past Medical History:   Diagnosis Date  . Allergy 2010  . Anxiety   . Arthritis of right hip 10/28/2016  . Basal cell carcinoma 11/17/2011   Dr. Naoma Diener  . Cataract 2010  . Cutaneous skin tags 07/24/2013  . DDD (degenerative disc disease), cervical    s/p fusion  . DDD (degenerative disc disease), thoracolumbar    on disability  . Depression   . Headache(784.0) 01/29/2013  . Hearing loss    Saw ENT on 04/27/2017; Moderate hearing loss in both ears.  . Heartburn   . Hiatal hernia   . HTN (hypertension) 2002  . Hyperlipemia 08/2014  . Hyperplastic colon polyp   . Hypothyroid   . Internal hemorrhoids   . Obesity   . Obesity 04/30/2017  . Pain in joint, shoulder region 11/20/2013  . Pancreatitis 04/24/2011  . Pneumonia 07/24/2013  . Preventative health care 10/28/2016  . Preventative health care 10/28/2016  . Tachycardia 08/12/2014  . Vitamin D deficiency 11/18/2015    Past Surgical History:  Procedure Laterality Date  . ANTERIOR FUSION CERVICAL SPINE  12/22/2006   C6-7  Dr Wynetta Emery  . CHOLECYSTECTOMY N/A 02/27/2013   Procedure: LAPAROSCOPIC CHOLECYSTECTOMY WITH INTRAOPERATIVE CHOLANGIOGRAM;  Surgeon: Ardeth Sportsman, MD;  Location: [facility];  Service: General;  Laterality: N/A;  . POSTERIOR FUSION CERVICAL SPINE  12/22/2009   C6-7  . SPINE SURGERY  2009    Family History  Problem Relation Age of Onset  . Hypertension Mother   . Hyperlipidemia Mother   . Arthritis Mother   . Hearing loss Mother   . Other Father  DDD, colectomy,cardiac stent, afib  . Colon polyps Father   . Anxiety disorder Father   . COPD Father        died due to complications from Covid-19  . Depression Father   . Diabetes Father   . Heart disease Father   . Hypertension Brother   . Diabetes Maternal Grandmother   . Diabetes Paternal Grandmother   . Colon cancer Neg Hx   . Stomach cancer Neg Hx   . Kidney disease Neg Hx     Social History   Socioeconomic History  . Marital status: Widowed    Spouse  name: Not on file  . Number of children: 0  . Years of education: Not on file  . Highest education level: Not on file  Occupational History  . Occupation: Disabled    Employer: UNEMPLOYED  Tobacco Use  . Smoking status: Never  . Smokeless tobacco: Never  Vaping Use  . Vaping status: Never Used  Substance and Sexual Activity  . Alcohol use: Yes    Alcohol/week: 5.0 standard drinks of alcohol    Types: 5 Cans of beer per week  . Drug use: No  . Sexual activity: Not Currently  Other Topics Concern  . Not on file  Social History Narrative   Reviewed history and no changes required.   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   Regular exercise-no   Smoking Status:  quit   Caffeine use/day:  none   Does Patient Exercise:  no         Social Drivers of Corporate investment banker Strain: Not on file  Food Insecurity: Not on file  Transportation Needs: Not on file  Physical Activity: Not on file  Stress: Not on file  Social Connections: Not on file  Intimate Partner Violence: Not on file    Outpatient Medications Prior to Visit  Medication Sig Dispense Refill  . atorvastatin (LIPITOR) 40 MG tablet TAKE 1 TABLET BY MOUTH DAILY 30 tablet 2  . diphenoxylate-atropine (LOMOTIL) 2.5-0.025 MG tablet TAKE ONE TO TWO TABLETS BY MOUTH FOUR TIMES DAILY AS NEEDED FOR DIARRHEA OR LOOSE STOOLS MAX OF 8 TABLETS PER 24 HOURS AS DIRECTED 30 tablet 1  . levothyroxine (SYNTHROID) 50 MCG tablet TAKE 1 TABLET BY MOUTH DAILY BEFORE BREAKFAST 90 tablet 1  . losartan (COZAAR) 25 MG tablet Take 1 tablet (25 mg total) by mouth daily. 90 tablet 1  . pantoprazole (PROTONIX) 40 MG tablet TAKE 1 TABLET BY MOUTH DAILY 30 tablet 3  . propranolol (INDERAL) 40 MG tablet TAKE 1 TABLET BY MOUTH 2 TIMES A DAY 180 tablet 1  . Vitamin D, Ergocalciferol, (DRISDOL) 1.25 MG (50000 UNIT) CAPS capsule TAKE 1 CAPSULE BY  MOUTH EVERY 7 DAYS 4 capsule 0  . zolpidem (AMBIEN) 10 MG tablet TAKE 1 TABLET BY MOUTH EVERY NIGHT AT BEDTIME AS NEEDED FOR SLEEP 15 tablet 3  . sertraline (ZOLOFT) 100 MG tablet Take 2 tablets (200 mg total) by mouth every morning. 180 tablet 1  . amitriptyline (ELAVIL) 50 MG tablet TAKE 1 TABLET BY MOUTH EVERY NIGHT AT BEDTIME AS NEEDED FOR SLEEP (Patient not taking: Reported on 02/25/2024) 30 tablet 3  . COVID-19 mRNA vaccine 2023-2024 (COMIRNATY) syringe Inject into the muscle. (Patient not taking: Reported on 02/25/2024) 0.3 mL 0   No facility-administered medications prior to visit.    Allergies  Allergen Reactions  . Amoxicillin-Pot Clavulanate     REACTION: Throat swelling.  . Azithromycin     REACTION: Throat Swelling  . Cefazolin     REACTION: Throat Swelling  . Moxifloxacin     REACTION: Throat Swelling  . Penicillins     REACTION: throat swelling  . Sulfonamide Derivatives     Review of Systems  Constitutional:  Positive for malaise/fatigue. Negative for fever.  HENT:  Negative for congestion.   Eyes:  Negative for blurred vision.  Respiratory:  Negative for shortness of breath.   Cardiovascular:  Negative for chest pain, palpitations and leg swelling.  Gastrointestinal:  Positive for abdominal pain and diarrhea. Negative for blood in stool, constipation, melena and nausea.  Genitourinary:  Negative for dysuria and frequency.  Musculoskeletal:  Positive for myalgias. Negative for falls.  Skin:  Negative for rash.  Neurological:  Negative for dizziness, loss of consciousness and headaches.  Endo/Heme/Allergies:  Negative for environmental allergies.  Psychiatric/Behavioral:  Positive for depression. The patient is nervous/anxious.        Objective:    Physical Exam Constitutional:      General: She is not in acute distress.    Appearance: Normal appearance. She is not diaphoretic.  HENT:     Head: Normocephalic and atraumatic.     Right Ear: Tympanic membrane,  ear canal and external ear normal.     Left Ear: Tympanic membrane, ear canal and external ear normal.     Nose: Nose normal.     Mouth/Throat:     Mouth: Mucous membranes are moist.     Pharynx: Oropharynx is clear. No oropharyngeal exudate.  Eyes:     General: No scleral icterus.       Right eye: No discharge.        Left eye: No discharge.     Conjunctiva/sclera: Conjunctivae normal.     Pupils: Pupils are equal, round, and reactive to light.  Neck:     Thyroid: No thyromegaly.  Cardiovascular:     Rate and Rhythm: Normal rate and regular rhythm.     Heart sounds: Normal heart sounds. No murmur heard. Pulmonary:     Effort: Pulmonary effort is normal. No respiratory distress.     Breath sounds: Normal breath sounds. No wheezing or rales.  Abdominal:     General: Bowel sounds are normal. There is no distension.     Palpations: Abdomen is soft. There is no mass.     Tenderness: There is no abdominal tenderness.  Musculoskeletal:        General: No tenderness. Normal range of motion.     Cervical back: Normal range of motion and neck supple.  Lymphadenopathy:     Cervical: No cervical adenopathy.  Skin:    General: Skin is warm and dry.     Findings: No rash.  Neurological:     General: No focal deficit present.     Mental Status: She is alert and oriented to person, place, and time.     Cranial Nerves: No cranial nerve deficit.     Coordination: Coordination normal.     Deep Tendon Reflexes: Reflexes are normal and symmetric. Reflexes normal.  Psychiatric:        Mood and Affect: Mood normal.        Behavior: Behavior normal.        Thought Content: Thought content normal.        Judgment: Judgment normal.   BP 112/78 (BP Location: Left  Arm, Patient Position: Sitting, Cuff Size: Normal)   Pulse 71   Temp 97.8 F (36.6 C) (Oral)   Resp 16   Ht 5\' 7"  (1.702 m)   Wt 206 lb (93.4 kg)   LMP 09/23/2011   SpO2 95%   BMI 32.26 kg/m  Wt Readings from Last 3 Encounters:   02/25/24 206 lb (93.4 kg)  08/27/23 208 lb (94.3 kg)  02/23/23 214 lb (97.1 kg)    Diabetic Foot Exam - Simple   No data filed    Lab Results  Component Value Date   WBC 7.7 08/27/2023   HGB 13.7 08/27/2023   HCT 42.8 08/27/2023   PLT 285.0 08/27/2023   GLUCOSE 85 08/27/2023   CHOL 175 08/27/2023   TRIG 167.0 (H) 08/27/2023   HDL 68.30 08/27/2023   LDLDIRECT 85.0 09/24/2022   LDLCALC 73 08/27/2023   ALT 27 08/27/2023   AST 21 08/27/2023   NA 141 08/27/2023   K 5.3 No hemolysis seen (H) 08/27/2023   CL 103 08/27/2023   CREATININE 0.61 08/27/2023   BUN 12 08/27/2023   CO2 30 08/27/2023   TSH 1.84 08/27/2023   HGBA1C 6.0 08/27/2023    Lab Results  Component Value Date   TSH 1.84 08/27/2023   Lab Results  Component Value Date   WBC 7.7 08/27/2023   HGB 13.7 08/27/2023   HCT 42.8 08/27/2023   MCV 92.0 08/27/2023   PLT 285.0 08/27/2023   Lab Results  Component Value Date   NA 141 08/27/2023   K 5.3 No hemolysis seen (H) 08/27/2023   CO2 30 08/27/2023   GLUCOSE 85 08/27/2023   BUN 12 08/27/2023   CREATININE 0.61 08/27/2023   BILITOT 0.5 08/27/2023   ALKPHOS 91 08/27/2023   AST 21 08/27/2023   ALT 27 08/27/2023   PROT 7.4 08/27/2023   ALBUMIN 4.3 08/27/2023   CALCIUM 9.9 08/27/2023   GFR 95.14 08/27/2023   Lab Results  Component Value Date   CHOL 175 08/27/2023   Lab Results  Component Value Date   HDL 68.30 08/27/2023   Lab Results  Component Value Date   LDLCALC 73 08/27/2023   Lab Results  Component Value Date   TRIG 167.0 (H) 08/27/2023   Lab Results  Component Value Date   CHOLHDL 3 08/27/2023   Lab Results  Component Value Date   HGBA1C 6.0 08/27/2023       Assessment & Plan:  Hyperglycemia Assessment & Plan: hgba1c acceptable, minimize simple carbs. Increase exercise as tolerated.   Orders: -     Hemoglobin A1c  Hypothyroidism, unspecified type Assessment & Plan: On Levothyroxine, continue to monitor  Orders: -      TSH  Primary hypertension Assessment & Plan: Well controlled, no changes to meds. Encouraged heart healthy diet such as the DASH diet and exercise as tolerated.   Orders: -     CBC with Differential/Platelet  Mixed hyperlipidemia Assessment & Plan: Encourage heart healthy diet such as MIND or DASH diet, increase exercise, avoid trans fats, simple carbohydrates and processed foods, consider a krill or fish or flaxseed oil cap daily.   Orders: -     Lipid panel  Preventative health care Assessment & Plan: Patient encouraged to maintain heart healthy diet, regular exercise, adequate sleep. Consider daily probiotics. Take medications as prescribed. Labs ordered and reviewed. Given and reviewed copy of ACP documents from [employer] and encouraged to complete and return  Pap 2017, patient in too  much pain today. She will pursue when feeling better.  MGM 08/2023 repeat every 1-2 years Colonoscopy 2020 needs repeat colonoscopy Dexa 2023  Recommend Prevnar 20, annual covid and flu vaccine   Vitamin D deficiency -     Comprehensive metabolic panel -     VITAMIN D 25 Hydroxy (Vit-D Deficiency, Fractures)  Diarrhea, unspecified type -     Ambulatory referral to Gastroenterology -     Cdiff NAA+O+P+Stool Culture; Future -     Fecal lactoferrin, quant; Future  Hx of colonic polyp -     Ambulatory referral to Gastroenterology  Abdominal pain, unspecified abdominal location -     Ambulatory referral to Gastroenterology  Other orders -     Hyoscyamine Sulfate; Place 1 tablet (0.125 mg total) under the tongue every 4 (four) hours as needed.  Dispense: 30 tablet; Refill: 0 -     Sertraline HCl; Take 1 tablet (100 mg total) by mouth every morning.  Dispense: 90 tablet; Refill: 1 -     Venlafaxine HCl ER; Take 1 capsule (37.5 mg total) by mouth daily with breakfast.  Dispense: 7 capsule; Refill: 0 -     Venlafaxine HCl ER; Take 1 capsule (75 mg total) by mouth daily with breakfast.   Dispense: 30 capsule; Refill: 3    Assessment and Plan Assessment & Plan Chronic Diarrhea Longstanding issue, exacerbated by stress. No acute abdominal pain. Possible differential diagnoses include IBS, C. diff infection, or stress-induced diarrhea. -Consider Hyoscyamine for cramping. -Refer to gastroenterology for further evaluation. -Order stool test to rule out infection.  Depression Chronic issue, possibly grief-related. Currently under the care of a psychiatrist. -Transition from Sertraline to Venlafaxine. Week 1:Start Venlafaxine 37.5mg  daily and continue Sertraline 200mg  daily. Week 2:Decrease Sertraline to 100mg  daily and increase Venlafaxine to 75mg  daily. Reevaluate in 8 weeks.  Lymphadenopathy Intermittent discomfort in cervical lymph node. Node is slightly enlarged but not significantly symptomatic. -Monitor for changes in size or discomfort. Consider ultrasound if symptoms persist or worsen.  Dehydration Dry tongue noted on examination, possibly due to inadequate fluid intake and chronic diarrhea. -Increase water intake, aiming for approximately 10 ounces every hour.  General Health Maintenance -Consider Prevnar 20 vaccination. -Schedule follow-up virtual visit in 8 weeks and in-person visit in 4 months. -Order routine labs.     Danise Edge, MD

## 2024-02-26 LAB — CBC WITH DIFFERENTIAL/PLATELET
Basophils Absolute: 0.1 10*3/uL (ref 0.0–0.1)
Basophils Relative: 1.2 % (ref 0.0–3.0)
Eosinophils Absolute: 0.1 10*3/uL (ref 0.0–0.7)
Eosinophils Relative: 1.3 % (ref 0.0–5.0)
HCT: 43.3 % (ref 36.0–46.0)
Hemoglobin: 14.1 g/dL (ref 12.0–15.0)
Lymphocytes Relative: 23.5 % (ref 12.0–46.0)
Lymphs Abs: 1.8 10*3/uL (ref 0.7–4.0)
MCHC: 32.5 g/dL (ref 30.0–36.0)
MCV: 92.1 fl (ref 78.0–100.0)
Monocytes Absolute: 0.6 10*3/uL (ref 0.1–1.0)
Monocytes Relative: 8.2 % (ref 3.0–12.0)
Neutro Abs: 5.1 10*3/uL (ref 1.4–7.7)
Neutrophils Relative %: 65.8 % (ref 43.0–77.0)
Platelets: 275 10*3/uL (ref 150.0–400.0)
RBC: 4.7 Mil/uL (ref 3.87–5.11)
RDW: 13.4 % (ref 11.5–15.5)
WBC: 7.8 10*3/uL (ref 4.0–10.5)

## 2024-02-26 LAB — LIPID PANEL
Cholesterol: 165 mg/dL (ref 0–200)
HDL: 57.5 mg/dL (ref >39.00)
LDL Cholesterol: 76 mg/dL (ref 0–99)
NonHDL: 107.49
Total CHOL/HDL Ratio: 3
Triglycerides: 155 mg/dL — ABNORMAL HIGH (ref 0.0–149.0)
VLDL: 31 mg/dL (ref 0.0–40.0)

## 2024-02-26 LAB — COMPREHENSIVE METABOLIC PANEL
ALT: 18 U/L (ref 0–35)
AST: 19 U/L (ref 0–37)
Albumin: 4.5 g/dL (ref 3.5–5.2)
Alkaline Phosphatase: 88 U/L (ref 39–117)
BUN: 12 mg/dL (ref 6–23)
CO2: 29 meq/L (ref 19–32)
Calcium: 9.6 mg/dL (ref 8.4–10.5)
Chloride: 103 meq/L (ref 96–112)
Creatinine, Ser: 0.63 mg/dL (ref 0.40–1.20)
GFR: 94.07 mL/min (ref >60.00)
Glucose, Bld: 87 mg/dL (ref 70–99)
Potassium: 5 meq/L (ref 3.5–5.1)
Sodium: 140 meq/L (ref 135–145)
Total Bilirubin: 0.6 mg/dL (ref 0.2–1.2)
Total Protein: 7.3 g/dL (ref 6.0–8.3)

## 2024-02-26 LAB — HEMOGLOBIN A1C: Hgb A1c MFr Bld: 5.8 % (ref 4.6–6.5)

## 2024-02-26 LAB — VITAMIN D 25 HYDROXY (VIT D DEFICIENCY, FRACTURES): VITD: 20.83 ng/mL — ABNORMAL LOW (ref 30.00–100.00)

## 2024-02-26 LAB — TSH: TSH: 2 u[IU]/mL (ref 0.35–5.50)

## 2024-02-29 ENCOUNTER — Ambulatory Visit (INDEPENDENT_AMBULATORY_CARE_PROVIDER_SITE_OTHER): Payer: 59 | Admitting: Behavioral Health

## 2024-02-29 ENCOUNTER — Encounter: Payer: Self-pay | Admitting: Family Medicine

## 2024-02-29 DIAGNOSIS — F321 Major depressive disorder, single episode, moderate: Secondary | ICD-10-CM | POA: Diagnosis not present

## 2024-02-29 DIAGNOSIS — F4329 Adjustment disorder with other symptoms: Secondary | ICD-10-CM

## 2024-02-29 DIAGNOSIS — F4381 Prolonged grief disorder: Secondary | ICD-10-CM

## 2024-03-01 ENCOUNTER — Other Ambulatory Visit: Payer: Self-pay | Admitting: Family Medicine

## 2024-03-01 ENCOUNTER — Other Ambulatory Visit: Payer: Self-pay | Admitting: Emergency Medicine

## 2024-03-01 MED ORDER — VITAMIN D (ERGOCALCIFEROL) 1.25 MG (50000 UNIT) PO CAPS: 50000.0000 [IU] | ORAL_CAPSULE | ORAL | 4 refills | Status: DC

## 2024-03-10 ENCOUNTER — Other Ambulatory Visit

## 2024-03-10 DIAGNOSIS — R197 Diarrhea, unspecified: Secondary | ICD-10-CM

## 2024-03-11 LAB — FECAL LACTOFERRIN, QUANT
Fecal Lactoferrin: NEGATIVE
MICRO NUMBER:: 16226586
SPECIMEN QUALITY:: ADEQUATE

## 2024-03-15 ENCOUNTER — Ambulatory Visit: Payer: 59 | Admitting: Behavioral Health

## 2024-03-16 ENCOUNTER — Encounter: Payer: Self-pay | Admitting: Family Medicine

## 2024-03-16 LAB — CDIFF NAA+O+P+STOOL CULTURE: E coli, Shiga toxin Assay: NEGATIVE

## 2024-03-17 NOTE — Progress Notes (Signed)
 [facility] Counselor/Therapist Progress Note  Patient ID: Kathy Huerta, MRN: [id number],    Date: 02/02/2024  Time Spent: 55 min In Person @ [facility] Office Time In: 2:00pm Time Out: 2:55pm   Treatment Type: Individual Therapy  Reported Symptoms: Small reduction in anx/dep & grief rxn  Mental Status Exam: Appearance:  Casual     Behavior: Appropriate and Sharing  Motor: Normal  Speech/Language:  Clear and Coherent  Affect: Appropriate  Mood: anxious  Thought process: normal  Thought content:   WNL  Sensory/Perceptual disturbances:   WNL  Orientation: oriented to person, place, time/date, and situation  Attention: Good  Concentration: Good  Memory: Kathy Huerta c/o forgetfulness  Fund of knowledge:  Good  Insight:   Good  Judgment:  Good  Impulse Control: Good   Risk Assessment: Danger to Self:  No Self-injurious Behavior: No Danger to Others: No Duty to Warn:no Physical Aggression / Violence:No  Access to Firearms a concern: No  Gang Involvement:No   Subjective: Kathy Huerta is upset today over Kathy Huerta Beloved oldest dog being sick. This is Kathy Huerta 8th Scottie & she loves them dearly.   Kathy Huerta recounts Kathy Huerta earliest memory of a death. 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 The Erasmo Score was esp'ly hard for Kathy Huerta as this boy was Kathy Huerta close friend & they biked everywhere tgthr. When his [employer] called to notify Kathy Huerta, she laughed. This disturbs Kathy Huerta now. Kathy Huerta next memory is of Kathy Huerta Paternal GM who was in a coma for 2 yrs.She was 63yo & moved back to [place] to attend [employer]. She had many friends, & was also painfully shy.   Kathy Huerta met Kathy Huerta Husb Les @ 63yo. They lived in the same [address]. They started to date one year after they met. They married in 1993 & went to [place] for a month on Honeymoon. [place] money. They visited [place] again on their 5th Anniversary.   It was 2 days prior to a trip they planned to the [place] when [place] died. He  never woke up from a surgical procedure. He was transferred from [facility] to [facility] & never woke up. 4 Family members came to [place] to say goodbye to him. Kathy Huerta had driven home the night prior to his death w/no idea she would lose Kathy Huerta Husb the next day.   Interventions: Solution-Oriented/Positive Psychology, Ego-Supportive, and Grief Therapy  Diagnosis:Current moderate episode of major depressive disorder without prior episode (HCC)  Prolonged grief reaction  Plan: As Simmie recounts the painful memories of Kathy Huerta Husb's last days, she is reminded how "surreal the situation seemed". This has caused Kathy Huerta deep grief as she remembers him 2 yrs later & wonders what went wrong & what she could have been more alert to during the situation. Kathy Huerta will write down things she still wants to know & ask Kathy Huerta Husb in Kathy Huerta Notebook to discuss next session.   Target Date: 02/20/2024  Progress: 7  Frequency: Once every 2-3 wks  Modality: Kathy Fraise, LMFT

## 2024-03-23 ENCOUNTER — Ambulatory Visit: Admitting: Behavioral Health

## 2024-03-29 ENCOUNTER — Ambulatory Visit: Admitting: Behavioral Health

## 2024-03-29 DIAGNOSIS — F4329 Adjustment disorder with other symptoms: Secondary | ICD-10-CM

## 2024-03-29 DIAGNOSIS — F321 Major depressive disorder, single episode, moderate: Secondary | ICD-10-CM | POA: Diagnosis not present

## 2024-03-29 NOTE — Progress Notes (Signed)
   Kathy Lever, LMFT

## 2024-03-30 ENCOUNTER — Ambulatory Visit: Admitting: Behavioral Health

## 2024-04-02 ENCOUNTER — Other Ambulatory Visit: Payer: Self-pay | Admitting: Family Medicine

## 2024-04-04 ENCOUNTER — Other Ambulatory Visit: Payer: Self-pay | Admitting: Family Medicine

## 2024-04-04 NOTE — Telephone Encounter (Signed)
 Requesting: Ambien 10mg   Contract: 03/10/23 UDS: None Last Visit: 02/25/24 Next Visit:  05/10/24 Last Refill: 12/15/23 #15 and 3RF   Please Advise

## 2024-04-08 ENCOUNTER — Other Ambulatory Visit: Payer: Self-pay | Admitting: Family Medicine

## 2024-04-11 NOTE — Telephone Encounter (Signed)
 Requesting: Lomotil  2.5-0.025 MG Contract: No UDS: 02/23/2023 Last Visit: 02/25/2024 Next Visit: 05/10/2024 Last Refill: 12/29/2023, Quantity:30 refills:1  Please Advise

## 2024-04-19 ENCOUNTER — Ambulatory Visit (INDEPENDENT_AMBULATORY_CARE_PROVIDER_SITE_OTHER): Admitting: Behavioral Health

## 2024-04-19 DIAGNOSIS — F321 Major depressive disorder, single episode, moderate: Secondary | ICD-10-CM

## 2024-04-19 DIAGNOSIS — F4320 Adjustment disorder, unspecified: Secondary | ICD-10-CM | POA: Diagnosis not present

## 2024-04-19 DIAGNOSIS — F4329 Adjustment disorder with other symptoms: Secondary | ICD-10-CM

## 2024-04-19 NOTE — Progress Notes (Signed)
   Deneise Lever, LMFT

## 2024-04-19 NOTE — Progress Notes (Signed)
 [facility] Counselor/Therapist Progress Note  Patient ID: JEE SOPPE, MRN: [id number],    Date: 04/19/2024  Time Spent: 50 min telehealth appt. Pt is @ home in her private space & Provider working remotely from [employer]. Pt is aware of the risks/limitations of telehealth & consents to Tx today.  Time In: 2:00pm Time Out: 2:45pm   Treatment Type: Individual Therapy  Reported Symptoms: Reduced anx/dep & stress levels due to Mother's recent move @ [employer] to Skilled N[employer]  Mental Status Exam: Appearance:  Casual     Behavior: Appropriate and Sharing  Motor: Normal  Speech/Language:  Clear and Coherent  Affect: Appropriate  Mood: normal  Thought process: normal  Thought content:   WNL  Sensory/Perceptual disturbances:   WNL  Orientation: oriented to person, place, time/date, and situation  Attention: Good  Concentration: Good  Memory: WNL  Fund of knowledge:  Good  Insight:   Good  Judgment:  Good  Impulse Control: Good   Risk Assessment: Danger to Self:  No Self-injurious Behavior: No Danger to Others: No Duty to Warn:no Physical Aggression / Violence:No  Access to Firearms a concern: No  Gang Involvement:No   Subjective: Pt's Mother has been transitioned to a Skilled N[employer] part of [employer]. Her housing items & the expectations of belongings has been underestimated by Pt. "It has been a long, hard month".   She has taken her Mom's dog Max to the facility where he was adopted. 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   Pt is trying to protect her Mother from situations & eventualities. She is spending time eating w/her Mother @ the [place].   Interventions: Grief Therapy and Insight-Oriented  Diagnosis:Current moderate episode of major depressive disorder without prior episode (HCC)  Prolonged grief reaction  Grief reaction  Plan: Donyell is excited for her Mother's new transition in [employer]. She is in a  safer place & thriving better. Lloyd is eager to turn in the keys on Wed, April 20, 2024. She conts to work to reduce her stress, process her grief, & better understand Family dynamics. She is concerned for her Nephew who is sep'd from his Wife & Toddler & they are expecting. She will use the suggestions provided today to support him more fully on the phone.  Target Date: 05/20/2024  Progress: 7  Frequency: Once every 2-3 wks  Modality: Deborrah Fam, LMFT

## 2024-05-02 ENCOUNTER — Encounter: Admitting: Obstetrics and Gynecology

## 2024-05-02 NOTE — Progress Notes (Signed)
 [facility] Counselor/Therapist Progress Note  Patient ID: Kathy Huerta, MRN: [id number],    Date: 02/29/2024  Time Spent: 50 min In Person @ [facility] Office Time In: 2:00pm Time Out: 2:50pm   Treatment Type: Individual Therapy  Reported Symptoms: Elevated anx/dep & grief rxn  Mental Status Exam: Appearance:  Casual     Behavior: Appropriate and Sharing  Motor: Normal  Speech/Language:  Clear and Coherent  Affect: Appropriate  Mood: sad  Thought process: normal  Thought content:   WNL  Sensory/Perceptual disturbances:   WNL  Orientation: oriented to person, place, time/date, and situation  Attention: Good  Concentration: Good  Memory: WNL  Fund of knowledge:  Good  Insight:   Good  Judgment:  Good  Impulse Control: Good   Risk Assessment: Danger to Self:  No Self-injurious Behavior: No Danger to Others: No Duty to Warn:no Physical Aggression / Violence:No  Access to Firearms a concern: No  Gang Involvement:No   Subjective: Pt describes crying daily in the past few wks. She has recently exp'd cramps. Her gut has been bothering her since his death. She has visited her PCP about this, but not answers yet.   Pt is talking regularly w/Family to reduce isolation. She has thought much about her Husb & the character he possessed. He never lied, they had a Church marriage, he was sweet & would do kind things for her. He spoke w/others easily & was very humorous. He was always doing kind things for others, including his Family & Pt's Mom.  Pt & Husb rarely argued. He would be proud of her for going to [facility] for her grief. She is finding it difficult to invision herself in [place] much longer w/o him. They were aligned in their views about money & the two had similar life goals.  Interventions: Grief Therapy and Psycho-education/Bibliotherapy & discussion about where ppl tend to hold their anxieties in the body-this is specific to ea individual.   Diagnosis:Current  moderate episode of major depressive disorder without prior episode (HCC)  Prolonged grief reaction  Plan: It is helpful for Pt to write out her feelings btwn sessions in a Notebook; it helps her guide our sessions in ways particular to her concerns. She will cont to use this tool. She will explore the idea of attending a 'Grief Share' group in the future.  Target Date: 03/22/2024  Progress: 6  Frequency: Once every 2-3 wks  Modality: Deborrah Fam, LMFT

## 2024-05-02 NOTE — Progress Notes (Signed)
   Deneise Lever, LMFT

## 2024-05-03 ENCOUNTER — Ambulatory Visit (INDEPENDENT_AMBULATORY_CARE_PROVIDER_SITE_OTHER): Admitting: Behavioral Health

## 2024-05-03 DIAGNOSIS — F321 Major depressive disorder, single episode, moderate: Secondary | ICD-10-CM | POA: Diagnosis not present

## 2024-05-03 DIAGNOSIS — F4321 Adjustment disorder with depressed mood: Secondary | ICD-10-CM | POA: Diagnosis not present

## 2024-05-03 DIAGNOSIS — F4329 Adjustment disorder with other symptoms: Secondary | ICD-10-CM

## 2024-05-03 DIAGNOSIS — F4320 Adjustment disorder, unspecified: Secondary | ICD-10-CM

## 2024-05-03 NOTE — Progress Notes (Unsigned)
   Deneise Lever, LMFT

## 2024-05-03 NOTE — Progress Notes (Unsigned)
 [facility] Counselor/Therapist Progress Note  Patient ID: Kathy Huerta, MRN: [id number],    Date: 05/03/2024  Time Spent: 50 min Caregility video; Pt is home in private & Provider working remotely from [employer]. Pt is aware of the risks/limitations of telehealth & consents to Tx today. Time In: 2:00pm Time Out: 2:50pm   Treatment Type: Individual Therapy  Reported Symptoms: Dec in anx/dep & stress   Mental Status Exam: Appearance:  Casual     Behavior: Appropriate and Sharing  Motor: Normal  Speech/Language:  Clear and Coherent  Affect: Appropriate  Mood: normal  Thought process: normal  Thought content:   WNL  Sensory/Perceptual disturbances:   WNL  Orientation: oriented to person, place, time/date, and situation  Attention: Good  Concentration: Good  Memory: WNL  Fund of knowledge:  Good  Insight:   Good  Judgment:  Good  Impulse Control: Good   Risk Assessment: Danger to Self:  No Self-injurious Behavior: No Danger to Others: No Duty to Warn:no Physical Aggression / Violence:No  Access to Firearms a concern: No  Gang Involvement:No   Subjective: Pt enjoyed the Mother Day celebration for her 36yo Mom who celebrates her [place]! Family all celebrated @ [employer] & Mother was very happy.  Pt is handling the need for a new HVAC Syst & a tree issue the Neighbor has pointed out. She has the expenses covered.   Pt is missing her Husb & is appreciative for her "growing up w/him".   Interventions: Family Systems & Grief Th  Diagnosis:Current moderate episode of major depressive disorder without prior episode (HCC)  Prolonged grief reaction  Grief reaction  Plan: Jamae enjoyed her Mother's Bday on Mother's Day & was very happy to see her thriving in [facility]. The Family was loud & healthy & happy.   Sanae's good friend Remonia Carmin is deep into her grief after the death of Husb.  Elroy Hallmark, LMFT

## 2024-05-05 ENCOUNTER — Other Ambulatory Visit: Payer: Self-pay | Admitting: Family Medicine

## 2024-05-05 NOTE — Addendum Note (Signed)
 Addended by: Regina Coppolino L on: 05/05/2024 03:35 PM   Modules accepted: Level of Service

## 2024-05-08 NOTE — Assessment & Plan Note (Signed)
 Supplement and monitor

## 2024-05-08 NOTE — Assessment & Plan Note (Signed)
 On Levothyroxine, continue to monitor

## 2024-05-08 NOTE — Assessment & Plan Note (Signed)
RRR 

## 2024-05-08 NOTE — Assessment & Plan Note (Signed)
 [id number] acceptable, minimize simple carbs. Increase exercise as tolerated.

## 2024-05-08 NOTE — Assessment & Plan Note (Signed)
 Well controlled, no changes to meds. Encouraged heart healthy diet such as the DASH diet and exercise as tolerated.

## 2024-05-08 NOTE — Assessment & Plan Note (Signed)
 Encourage heart healthy diet such as MIND or DASH diet, increase exercise, avoid trans fats, simple carbohydrates and processed foods, consider a krill or fish or flaxseed oil cap daily.

## 2024-05-10 ENCOUNTER — Telehealth (INDEPENDENT_AMBULATORY_CARE_PROVIDER_SITE_OTHER): Admitting: Family Medicine

## 2024-05-10 DIAGNOSIS — E559 Vitamin D deficiency, unspecified: Secondary | ICD-10-CM

## 2024-05-10 DIAGNOSIS — E782 Mixed hyperlipidemia: Secondary | ICD-10-CM

## 2024-05-10 DIAGNOSIS — R739 Hyperglycemia, unspecified: Secondary | ICD-10-CM

## 2024-05-10 DIAGNOSIS — I10 Essential (primary) hypertension: Secondary | ICD-10-CM

## 2024-05-10 DIAGNOSIS — E039 Hypothyroidism, unspecified: Secondary | ICD-10-CM | POA: Diagnosis not present

## 2024-05-10 DIAGNOSIS — R000 Tachycardia, unspecified: Secondary | ICD-10-CM

## 2024-05-10 MED ORDER — ZOLPIDEM TARTRATE 10 MG PO TABS: 10.0000 mg | ORAL_TABLET | Freq: Every evening | ORAL | 2 refills | Status: DC | PRN

## 2024-05-11 NOTE — Progress Notes (Signed)
 MyChart Video Visit    Virtual Visit via Video Note   This patient is at least at moderate risk for complications without adequate follow up. This format is felt to be most appropriate for this patient at this time. Physical exam was limited by quality of the video and audio technology used for the visit. Porsha, CMA was able to get the patient set up on a video visit.  Patient location: home Patient and provider in visit Provider location: Office  I discussed the limitations of evaluation and management by telemedicine and the availability of in person appointments. The patient expressed understanding and agreed to proceed.  Visit Date: 05/10/2024  Today's healthcare provider: Randie Bustle, MD     Subjective:    Patient ID: Kathy Huerta, female    DOB: [birth year], 63 y.o.   MRN: [id number]  Chief Complaint  Patient presents with   Quality Metric Gaps    Pap, colonoscopy, HIV   Medical Management of Chronic Issues    Patient presents today for a 2 month follow-up    HPI Discussed the use of AI scribe software for clinical note transcription with the patient, who gave verbal consent to proceed.  History of Present Illness Kathy Huerta is a 63 year old female who presents for follow-up of her mental health and gastrointestinal symptoms.  She has experienced significant improvement in her mental health symptoms since starting sertraline  100 mg and venlafaxine  75 mg. She feels much better and notes a marked change in her mood, stating she no longer feels like the world is crushing her every day. She still experiences moments of stress but without the previous uncontrollable anxiety, even during a recent stressful event involving her mother.  She experiences sleep disturbances and uses zolpidem  (Ambien ) for sleep, taking at least half a tablet most nights and a full tablet when necessary. She finds it helps tremendously with her sleep issues, currently taking 5 to 10 mg  daily.  She has a history of gastrointestinal issues, including abdominal pain and diarrhea. A sublingual medication prescribed for her stomach pain has been effective, describing it as a miracle drug. She uses it a couple of times a week, noting that her symptoms have decreased in frequency. Her diarrhea has also improved, with episodes occurring less than once a week, compared to almost every other day previously.  No significant gastrointestinal upset aside from the previously mentioned issues. Reports sleep disturbances but no other significant side effects from her current medications.    Past Medical History:  Diagnosis Date   Allergy 2010   Anxiety    Arthritis of right hip 10/28/2016   Basal cell carcinoma 11/17/2011   Dr. Ricke [place]   Cataract 2010   Cutaneous skin tags 07/24/2013   DDD (degenerative disc disease), cervical    s/p fusion   DDD (degenerative disc disease), thoracolumbar    on disability   Depression    Headache(784.0) 01/29/2013   Hearing loss    Saw ENT on 04/27/2017; Moderate hearing loss in both ears.   Heartburn    Hiatal hernia    HTN (hypertension) 2002   Hyperlipemia 08/2014   Hyperplastic colon polyp    Hypothyroid    Internal hemorrhoids    Obesity    Obesity 04/30/2017   Pain in joint, shoulder region 11/20/2013   Pancreatitis 04/24/2011   Pneumonia 07/24/2013   Preventative health care 10/28/2016   Preventative health care 10/28/2016   Tachycardia 08/12/2014   Vitamin  D deficiency 11/18/2015    Past Surgical History:  Procedure Laterality Date   ANTERIOR FUSION CERVICAL SPINE  12/22/2006   C6-7  Dr Lamon Pillow   CHOLECYSTECTOMY N/A 02/27/2013   Procedure: LAPAROSCOPIC CHOLECYSTECTOMY WITH INTRAOPERATIVE CHOLANGIOGRAM;  Surgeon: Eddye Goodie, MD;  Location: [facility];  Service: General;  Laterality: N/A;   POSTERIOR FUSION CERVICAL SPINE  12/22/2009   C6-7   SPINE SURGERY  2009    Family History  Problem Relation Age of Onset    Hypertension Mother    Hyperlipidemia Mother    Arthritis Mother    Hearing loss Mother    Other Father        DDD, colectomy,cardiac stent, afib   Colon polyps Father    Anxiety disorder Father    COPD Father        died due to complications from Covid-19   Depression Father    Diabetes Father    Heart disease Father    Hypertension Brother    Diabetes Maternal Grandmother    Diabetes Paternal Grandmother    Colon cancer Neg Hx    Stomach cancer Neg Hx    Kidney disease Neg Hx     Social History   Socioeconomic History   Marital status: Widowed    Spouse name: Not on file   Number of children: 0   Years of education: Not on file   Highest education level: Not on file  Occupational History   Occupation: Disabled    Employer: UNEMPLOYED  Tobacco Use   Smoking status: Never   Smokeless tobacco: Never  Vaping Use   Vaping status: Never Used  Substance and Sexual Activity   Alcohol use: Yes    Alcohol/week: 5.0 standard drinks of alcohol    Types: 5 Cans of beer per week   Drug use: No   Sexual activity: Not Currently  Other Topics Concern   Not on file  Social History Narrative   Reviewed history and no changes required.   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   Regular exercise-no   Smoking Status:  quit   Caffeine use/day:  none   Does Patient Exercise:  no         Social Drivers of Corporate investment banker Strain: Not on file  Food Insecurity: Not on file  Transportation Needs: Not on file  Physical Activity: Not on file  Stress: Not on file  Social Connections: Not on file  Intimate Partner Violence: Not on file    Outpatient Medications Prior to Visit  Medication Sig Dispense Refill   atorvastatin  (LIPITOR) 40 MG tablet TAKE 1 TABLET BY MOUTH DAILY 90 tablet 1   diphenoxylate -atropine  (LOMOTIL ) 2.5-0.025 MG tablet TAKE ONE TO TWO TABLETS BY  MOUTH FOUR TIMES DAILY AS NEEDED FOR DIARRHEA OR LOOSE STOOLS MAX OF 8 TABLETS PER 24 HOURS AS DIRECTED 30 tablet 1   hyoscyamine  (LEVSIN SL) 0.125 MG SL tablet Place 1 tablet (0.125 mg total) under the tongue every 4 (four) hours as needed. 30 tablet 0   levothyroxine  (SYNTHROID ) 50 MCG tablet TAKE 1 TABLET BY MOUTH DAILY BEFORE BREAKFAST 90 tablet 1   losartan  (COZAAR ) 25 MG tablet TAKE 1 TABLET BY MOUTH DAILY 90 tablet 1   pantoprazole  (PROTONIX ) 40 MG tablet TAKE 1 TABLET BY MOUTH DAILY 90 tablet 1   propranolol  (INDERAL ) 40 MG tablet TAKE  1 TABLET BY MOUTH 2 TIMES A DAY 180 tablet 1   sertraline  (ZOLOFT ) 100 MG tablet TAKE 2 TABLETS BY MOUTH EVERY MORNING 180 tablet 1   venlafaxine  XR (EFFEXOR  XR) 75 MG 24 hr capsule Take 1 capsule (75 mg total) by mouth daily with breakfast. 30 capsule 3   Vitamin D , Ergocalciferol , (DRISDOL ) 1.25 MG (50000 UNIT) CAPS capsule Take 1 capsule (50,000 Units total) by mouth every 7 (seven) days. 4 capsule 4   zolpidem  (AMBIEN ) 10 MG tablet TAKE 1 TABLET BY MOUTH EVERY NIGHT AT BEDTIME AS NEEDED FOR SLEEP 15 tablet 1   amitriptyline  (ELAVIL ) 50 MG tablet TAKE 1 TABLET BY MOUTH EVERY NIGHT AT BEDTIME AS NEEDED FOR SLEEP (Patient not taking: Reported on 05/10/2024) 30 tablet 3   venlafaxine  XR (EFFEXOR  XR) 37.5 MG 24 hr capsule Take 1 capsule (37.5 mg total) by mouth daily with breakfast. 7 capsule 0   Vitamin D , Ergocalciferol , (DRISDOL ) 1.25 MG (50000 UNIT) CAPS capsule TAKE 1 CAPSULE BY MOUTH EVERY 7 DAYS 4 capsule 0   No facility-administered medications prior to visit.    Allergies  Allergen Reactions   Amoxicillin-Pot Clavulanate     REACTION: Throat swelling.   Azithromycin     REACTION: Throat Swelling   Cefazolin     REACTION: Throat Swelling   Moxifloxacin     REACTION: Throat Swelling   Penicillins     REACTION: throat swelling   Sulfonamide Derivatives     Review of Systems  Constitutional:  Positive for malaise/fatigue. Negative for fever.   HENT:  Negative for congestion.   Eyes:  Negative for blurred vision.  Respiratory:  Negative for shortness of breath.   Cardiovascular:  Negative for chest pain, palpitations and leg swelling.  Gastrointestinal:  Positive for abdominal pain and diarrhea. Negative for blood in stool and nausea.  Genitourinary:  Negative for dysuria and frequency.  Musculoskeletal:  Negative for falls.  Skin:  Negative for rash.  Neurological:  Negative for dizziness, loss of consciousness and headaches.  Endo/Heme/Allergies:  Negative for environmental allergies.  Psychiatric/Behavioral:  Negative for depression. The patient is nervous/anxious and has insomnia.        Objective:     Physical Exam Constitutional:      General: She is not in acute distress.    Appearance: Normal appearance. She is not ill-appearing or toxic-appearing.  HENT:     Head: Normocephalic and atraumatic.     Right Ear: External ear normal.     Left Ear: External ear normal.     Nose: Nose normal.  Eyes:     General:        Right eye: No discharge.        Left eye: No discharge.  Pulmonary:     Effort: Pulmonary effort is normal.  Skin:    Findings: No rash.  Neurological:     Mental Status: She is alert and oriented to person, place, and time.  Psychiatric:        Behavior: Behavior normal.     LMP 09/23/2011  Wt Readings from Last 3 Encounters:  02/25/24 206 lb (93.4 kg)  08/27/23 208 lb (94.3 kg)  02/23/23 214 lb (97.1 kg)       Assessment & Plan:  Hyperglycemia Assessment & Plan: hgba1c acceptable, minimize simple carbs. Increase exercise as tolerated.    Mixed hyperlipidemia Assessment & Plan: Encourage heart healthy diet such as MIND or DASH diet, increase exercise, avoid trans fats, simple carbohydrates and  processed foods, consider a krill or fish or flaxseed oil cap daily.    Primary hypertension Assessment & Plan: Well controlled, no changes to meds. Encouraged heart healthy diet such  as the DASH diet and exercise as tolerated.    Hypothyroidism, unspecified type Assessment & Plan: On Levothyroxine , continue to monitor   Vitamin D  deficiency Assessment & Plan: Supplement and monitor    Tachycardia Assessment & Plan: RRR   Other orders -     Zolpidem  Tartrate; Take 1 tablet (10 mg total) by mouth at bedtime as needed. for sleep  Dispense: 30 tablet; Refill: 2     Assessment and Plan Assessment & Plan Depression Significant improvement with sertraline  and venlafaxine . Prefers current regimen due to positive effects. - Continue sertraline  100 mg oral daily. - Continue venlafaxine  75 mg oral daily. - Reassess in future appointments.  Insomnia Uses zolpidem  effectively for sleep disturbances. Acknowledges goal to reduce reliance but currently necessary due to stress. - Prescribe zolpidem  5-10 mg nightly as needed, 30 tablets with refills, pending insurance approval. - Long-term goal to reduce reliance on zolpidem .  Chronic Diarrhea Significant reduction in frequency of loose stools. Medication effective as needed. - Continue current management for diarrhea as needed.  Abdominal Pain Pain related to colon spasms managed with sublingual medication, used less frequently. - Continue using sublingual medication for abdominal pain as needed.     I discussed the assessment and treatment plan with the patient. The patient was provided an opportunity to ask questions and all were answered. The patient agreed with the plan and demonstrated an understanding of the instructions.   The patient was advised to call back or seek an in-person evaluation if the symptoms worsen or if the condition fails to improve as anticipated.  Randie Bustle, MD [facility] at [facility] [phone] (phone) [phone] (fax)  [facility]

## 2024-05-17 ENCOUNTER — Other Ambulatory Visit (HOSPITAL_COMMUNITY)
Admission: RE | Admit: 2024-05-17 | Discharge: 2024-05-17 | Disposition: A | Discharge status: Home / Self Care | Source: Ambulatory Visit | Attending: Obstetrics and Gynecology | Admitting: Obstetrics and Gynecology

## 2024-05-17 ENCOUNTER — Ambulatory Visit (INDEPENDENT_AMBULATORY_CARE_PROVIDER_SITE_OTHER): Admitting: Obstetrics and Gynecology

## 2024-05-17 VITALS — BP 109/66 | HR 67 | Ht 67.0 in | Wt 203.0 lb

## 2024-05-17 DIAGNOSIS — Z01419 Encounter for gynecological examination (general) (routine) without abnormal findings: Secondary | ICD-10-CM | POA: Diagnosis present

## 2024-05-17 DIAGNOSIS — Z124 Encounter for screening for malignant neoplasm of cervix: Secondary | ICD-10-CM

## 2024-05-17 DIAGNOSIS — Z1339 Encounter for screening examination for other mental health and behavioral disorders: Secondary | ICD-10-CM | POA: Diagnosis not present

## 2024-05-17 NOTE — Progress Notes (Signed)
 ANNUAL EXAM Patient name: Kathy Huerta MRN [id number]  Date of birth: 07-Mar-1961 Chief Complaint:   No chief complaint on file.  History of Present Illness:   Kathy Huerta is a 63 y.o. No obstetric history on file.  female being seen today for a routine annual exam.  Current complaints: Last two paps very uncomfortable, she will occasional have vaginal  discomfort even with walking.  Had some life events and stressors happen which is why she has not been seen in a while. She has had trouble with her mental health, she just started another medication and is starting to see some changes.  She is followed by primary care. She has a history of stomach issues has an upcoming appt with GI    Patient's last menstrual period was 09/23/2011.   The pregnancy intention screening data noted above was reviewed. Potential methods of contraception were discussed. The patient elected to proceed with No data recorded.   Last pap 2017. Results were: NILM w/ HRHPV negative. H/O abnormal pap: no Last mammogram: 02/08/2023. Results were: normal. Family h/o breast cancer: no Last colonoscopy: 2020. Results were: polyps and hemorrhoid detected . Family h/o colorectal cancer: yes father hx      05/17/2024    2:49 PM 02/25/2024    1:35 PM 02/23/2023   11:09 AM 05/15/2022   10:08 AM 04/12/2021   11:11 AM  Depression screen PHQ 2/9  Decreased Interest 1 3 2 3  0  Down, Depressed, Hopeless 1 3 2 3  0  PHQ - 2 Score 2 6 4 6  0  Altered sleeping 2 3 3 3    Tired, decreased energy 3 3 3 3    Change in appetite 1 3 0 3   Feeling bad or failure about yourself  1 3 0 3   Trouble concentrating 1 3 0 3   Moving slowly or fidgety/restless 0 1 0 2   Suicidal thoughts 0 2 0 0   PHQ-9 Score 10 24 10 23    Difficult doing work/chores  Very difficult Not difficult at all Very difficult         05/17/2024    2:49 PM 02/25/2024    1:36 PM 02/23/2023   11:10 AM  GAD 7 : Generalized Anxiety Score  Nervous, Anxious, on Edge 2 3 2    Control/stop worrying 1 3 2   Worry too much - different things 1 3 2   Trouble relaxing 2 3 2   Restless 0 0 2  Easily annoyed or irritable 1 3 2   Afraid - awful might happen 1 3 0  Total GAD 7 Score 8 18 12   Anxiety Difficulty  Very difficult Not difficult at all     Review of Systems:   Pertinent items are noted in HPI Denies any headaches, blurred vision, fatigue, shortness of breath, chest pain, abdominal pain, abnormal vaginal discharge/itching/odor/irritation, problems with periods, bowel movements, urination, or intercourse unless otherwise stated above. Pertinent History Reviewed:  Reviewed past medical,surgical, social and family history.  Reviewed problem list, medications and allergies. Physical Assessment:   Vitals:   05/17/24 1427  BP: 109/66  Pulse: 67  Weight: 203 lb (92.1 kg)  Height: 5\' 7"  (1.702 m)  Body mass index is 31.79 kg/m.        Physical Examination:   General appearance - well appearing, and in no distress  Mental status - alert, oriented  Psych:  She has a normal mood and affect  Skin - warm and dry, normal color  Chest - effort normal, all lung fields clear to auscultation bilaterally  Heart - normal rate and regular rhythm  Neck:  midline trachea  Breasts - breasts appear normal, no suspicious masses, no skin or nipple changes or  axillary nodes  Abdomen - soft, nontender  Pelvic - VULVA: normal appearing vulva with no masses, tenderness or lesions  VAGINA: normal appearing vagina with normal color and discharge, no lesions  CERVIX: normal appearing cervix without discharge or lesions, no CMT  Thin prep pap is done w HR HPV cotesting  UTERUS: uterus is felt to be normal size, shape, consistency and nontender   ADNEXA: No adnexal masses or tenderness noted.  Extremities:  No swelling or varicosities noted  Chaperone present for exam  No results found for this or any previous visit (from the past 24 hours).  Assessment & Plan:  1. Encounter  for gynecological examination (Primary) Appt scheduled with GI in June Established with PCP Discussed vaginal moisturizer like replense vs vaginal estrogen, would like to trial replense  - Cytology - PAP - MM 3D SCREENING MAMMOGRAM BILATERAL BREAST; Future  2. Cervical cancer screening Discussed guidelines if normal pap completion of pap @ 65, no hx abnormal paps - Cytology - PAP   Labs/procedures today:   Mammogram: due in September, or sooner if problems Colonoscopy: per GI, or sooner if problems  Orders Placed This Encounter  Procedures   MM 3D SCREENING MAMMOGRAM BILATERAL BREAST    Meds: No orders of the defined types were placed in this encounter.   Follow-up: No follow-ups on file. Future Appointments  Date Time Provider Department Center  05/18/2024  1:00 PM Elroy Hallmark, [place] [facility] None  05/24/2024  3:00 PM Pyrtle, Amber Bail, MD [facility]  06/07/2024  1:00 PM Elroy Hallmark, LMFT [facility] None  07/11/2024 11:20 AM Neda Balk, MD [facility]  09/01/2024  2:20 PM Neda Balk, MD [facility]     Kathy Huerta, [place] 05/17/2024 8:36 PM

## 2024-05-18 ENCOUNTER — Ambulatory Visit (INDEPENDENT_AMBULATORY_CARE_PROVIDER_SITE_OTHER): Admitting: Behavioral Health

## 2024-05-18 DIAGNOSIS — F4320 Adjustment disorder, unspecified: Secondary | ICD-10-CM

## 2024-05-18 DIAGNOSIS — F4329 Adjustment disorder with other symptoms: Secondary | ICD-10-CM

## 2024-05-18 DIAGNOSIS — F321 Major depressive disorder, single episode, moderate: Secondary | ICD-10-CM

## 2024-05-18 NOTE — Addendum Note (Signed)
 Addended by: Rayen Dafoe L on: 05/18/2024 02:09 PM   Modules accepted: Level of Service

## 2024-05-18 NOTE — Progress Notes (Addendum)
 [facility] Counselor/Therapist Progress Note  Patient ID: Kathy Huerta, MRN: [id number],    Date: 05/18/2024  Time Spent: 50 min Caregility video;  Pt is home in private & Provider is working remotely from [employer]. Pt is aware of the risks/limitations of telehealth & consents to Tx today. Time In: 1:00pm Time Out: 1:45pm  Treatment Type: Individual Therapy  Reported Symptoms: Reduction in stress due to Mother's recent fall in her new location @ [place]. Elevated anx/dep levels.  Mental Status Exam: Appearance:  Casual     Behavior: Appropriate and Sharing  Motor: Normal  Speech/Language:  Clear and Coherent  Affect: Appropriate  Mood: normal  Thought process: normal  Thought content:   WNL  Sensory/Perceptual disturbances:   WNL  Orientation: oriented to person, place, time/date, and situation  Attention: Good  Concentration: Good  Memory: WNL  Fund of knowledge:  Good  Insight:   Good  Judgment:  Good  Impulse Control: Good   Risk Assessment: Danger to Self:  No Self-injurious Behavior: No Danger to Others: No Duty to Warn:no Physical Aggression / Violence:No  Access to Firearms a concern: No  Gang Involvement:No   Subjective: Pt is worried for her Mother's health/inc'd fall risk, & recent visit to the ED that resulted in 6 stitches on her forehead. She is also worried for her 33yo Nephew who has been threatening to kill himself. 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   Pt is doing much better. She expresses her ability to keep things in perspective has improved greatly. The last 1-2 months she has felt this inc in her mental health stability & she appreciates the Supportive Team Members she can rely on when things get rough. This is happening w/much reduced freq & Pt is grateful.   Interventions: Psycho-education/Bibliotherapy and Family  Systems  Diagnosis:Current moderate episode of major depressive disorder without prior episode (HCC)  Prolonged grief reaction  Grief reaction  Plan: Rainy will cont her regime of medications as they are serving her well. She is embarking on the cont'd Caregiving duties for her Mother & this brings daily challenges, but she is being realistic, proactive, & positive. We will meet again in mid-June.  Target Date: 06/20/2024  Progress: 7  Frequency: Once every 2-3 wks or as schedule of Caregiving allows  Modality: Deborrah Fam, LMFT

## 2024-05-18 NOTE — Progress Notes (Signed)
   Kathy Lever, LMFT

## 2024-05-24 ENCOUNTER — Encounter: Payer: Self-pay | Admitting: Internal Medicine

## 2024-05-24 ENCOUNTER — Ambulatory Visit: Payer: Self-pay | Admitting: Obstetrics and Gynecology

## 2024-05-24 ENCOUNTER — Ambulatory Visit: Admitting: Internal Medicine

## 2024-05-24 VITALS — BP 110/70 | HR 72 | Ht 66.75 in | Wt 206.3 lb

## 2024-05-24 DIAGNOSIS — Z860101 Personal history of adenomatous and serrated colon polyps: Secondary | ICD-10-CM | POA: Diagnosis not present

## 2024-05-24 DIAGNOSIS — K219 Gastro-esophageal reflux disease without esophagitis: Secondary | ICD-10-CM | POA: Diagnosis not present

## 2024-05-24 DIAGNOSIS — K580 Irritable bowel syndrome with diarrhea: Secondary | ICD-10-CM | POA: Diagnosis not present

## 2024-05-24 DIAGNOSIS — R109 Unspecified abdominal pain: Secondary | ICD-10-CM

## 2024-05-24 DIAGNOSIS — Z860100 Personal history of colon polyps, unspecified: Secondary | ICD-10-CM

## 2024-05-24 DIAGNOSIS — K449 Diaphragmatic hernia without obstruction or gangrene: Secondary | ICD-10-CM | POA: Diagnosis not present

## 2024-05-24 LAB — CYTOLOGY - PAP
Comment: NEGATIVE
Diagnosis: NEGATIVE
Diagnosis: REACTIVE
High risk HPV: NEGATIVE

## 2024-05-24 MED ORDER — NA SULFATE-K SULFATE-MG SULF 17.5-3.13-1.6 GM/177ML PO SOLN: 1.0000 | Freq: Once | ORAL | 0 refills | Status: AC

## 2024-05-24 MED ORDER — PANTOPRAZOLE SODIUM 40 MG PO TBEC: 40.0000 mg | DELAYED_RELEASE_TABLET | Freq: Every day | ORAL | 3 refills | Status: AC

## 2024-05-24 MED ORDER — HYOSCYAMINE SULFATE 0.125 MG SL SUBL: SUBLINGUAL_TABLET | SUBLINGUAL | 1 refills | Status: AC

## 2024-05-24 NOTE — Patient Instructions (Signed)
 We have sent the following medications to your pharmacy for you to pick up at your convenience: Levsin, pantoprazole .   Please purchase the following medications over the counter and take as directed: famotidine 20 mg in the evening for break through symptoms.   You have been scheduled for a colonoscopy. Please follow written instructions given to you at your visit today.   If you use inhalers (even only as needed), please bring them with you on the day of your procedure.  DO NOT TAKE 7 DAYS PRIOR TO TEST- Trulicity (dulaglutide) Ozempic, Wegovy (semaglutide) Mounjaro (tirzepatide) Bydureon Bcise (exanatide extended release)  DO NOT TAKE 1 DAY PRIOR TO YOUR TEST Rybelsus (semaglutide) Adlyxin (lixisenatide) Victoza (liraglutide) Byetta (exanatide) ___________________________________________________________________________

## 2024-05-25 ENCOUNTER — Encounter: Payer: Self-pay | Admitting: Internal Medicine

## 2024-05-25 NOTE — Progress Notes (Signed)
 Patient ID: Kathy Huerta, female   DOB: 03-19-1961, 63 y.o.   MRN: [id number] HPI: Kathy Huerta is a 63 year old female with a history of colonic polyps, IBS with alternating bowel habits, GERD, Sjogren syndrome, hypertension and hyperlipidemia who is here for follow-up.  She was last seen in 2020.  She is here alone today.  She has been experiencing loose stools and diarrhea, which have worsened since the passing of her husband in 2022. Stress exacerbates her symptoms, leading to accidents, particularly when traveling by car. She uses diphenoxylate  for diarrhea, which results in constipation, and an antispasmodic, hyoscyamine , for cramping, which she finds effective. She notes a decreased need for these medications recently.  She has a history of colonic polyps, with the last colonoscopy performed on October 28, 2019, due to a family history of a first-degree relative with an advanced adenoma. The colonoscopy revealed five polyps, four of which were tubular adenomas and one polyp without dysplasia, as well as internal hemorrhoids. Stool studies conducted on March 10, 2024, were negative for infections and inflammation.  She experiences increased indigestion or heartburn, although not as severe as in the past. She uses pantoprazole  for reflux and takes an over-the-counter herbal remedy for breakthrough symptoms. Her last upper endoscopy was ten years ago, which showed a small hiatal hernia but no Barrett's esophagus.  Recent lab work on February 25, 2024, showed normal liver function tests, a normal white blood cell count, hemoglobin, and platelet count. Her TSH was normal, hemoglobin A1c was 5.8, and vitamin D  was slightly low at 20.83.  Past Medical History:  Diagnosis Date   Allergy 2010   Anxiety    Arthritis of right hip 10/28/2016   Basal cell carcinoma 11/17/2011   Dr. Ricke [place]   Cataract 2010   Colon polyps    Cutaneous skin tags 07/24/2013   DDD (degenerative disc disease), cervical    s/p  fusion   DDD (degenerative disc disease), thoracolumbar    on disability   Depression    Headache(784.0) 01/29/2013   Hearing loss    Saw ENT on 04/27/2017; Moderate hearing loss in both ears.   Heartburn    Hiatal hernia    HTN (hypertension) 2002   Hyperlipemia 08/2014   Hyperplastic colon polyp    Hypothyroid    Internal hemorrhoids    Obesity 04/30/2017   Pain in joint, shoulder region 11/20/2013   Pancreatitis 04/24/2011   Pneumonia 07/24/2013   Preventative health care 10/28/2016   Preventative health care 10/28/2016   Tachycardia 08/12/2014   Vitamin D  deficiency 11/18/2015    Past Surgical History:  Procedure Laterality Date   ANTERIOR FUSION CERVICAL SPINE  12/22/2006   C6-7  Dr Lamon Pillow   CHOLECYSTECTOMY N/A 02/27/2013   Procedure: LAPAROSCOPIC CHOLECYSTECTOMY WITH INTRAOPERATIVE CHOLANGIOGRAM;  Surgeon: Eddye Goodie, MD;  Location: [facility];  Service: General;  Laterality: N/A;   POSTERIOR FUSION CERVICAL SPINE  12/22/2009   C6-7   SPINE SURGERY  2009    Outpatient Medications Prior to Visit  Medication Sig Dispense Refill   atorvastatin  (LIPITOR) 40 MG tablet TAKE 1 TABLET BY MOUTH DAILY 90 tablet 1   diclofenac  Sodium (VOLTAREN ) 1 % GEL Apply 2 g topically as needed.     diphenoxylate -atropine  (LOMOTIL ) 2.5-0.025 MG tablet TAKE ONE TO TWO TABLETS BY MOUTH FOUR TIMES DAILY AS NEEDED FOR DIARRHEA OR LOOSE STOOLS MAX OF 8 TABLETS PER 24 HOURS AS DIRECTED 30 tablet 1   levothyroxine  (SYNTHROID ) 50 MCG tablet TAKE 1  TABLET BY MOUTH DAILY BEFORE BREAKFAST 90 tablet 1   losartan  (COZAAR ) 25 MG tablet TAKE 1 TABLET BY MOUTH DAILY 90 tablet 1   propranolol  (INDERAL ) 40 MG tablet TAKE 1 TABLET BY MOUTH 2 TIMES A DAY 180 tablet 1   sertraline  (ZOLOFT ) 100 MG tablet TAKE 2 TABLETS BY MOUTH EVERY MORNING 180 tablet 1   venlafaxine  XR (EFFEXOR  XR) 75 MG 24 hr capsule Take 1 capsule (75 mg total) by mouth daily with breakfast. 30 capsule 3   Vitamin D , Ergocalciferol ,  (DRISDOL ) 1.25 MG (50000 UNIT) CAPS capsule Take 1 capsule (50,000 Units total) by mouth every 7 (seven) days. 4 capsule 4   zolpidem  (AMBIEN ) 10 MG tablet Take 1 tablet (10 mg total) by mouth at bedtime as needed. for sleep 30 tablet 2   hyoscyamine  (LEVSIN SL) 0.125 MG SL tablet Place 1 tablet (0.125 mg total) under the tongue every 4 (four) hours as needed. 30 tablet 0   pantoprazole  (PROTONIX ) 40 MG tablet TAKE 1 TABLET BY MOUTH DAILY 90 tablet 1   amitriptyline  (ELAVIL ) 50 MG tablet TAKE 1 TABLET BY MOUTH EVERY NIGHT AT BEDTIME AS NEEDED FOR SLEEP (Patient not taking: Reported on 02/25/2024) 30 tablet 3   No facility-administered medications prior to visit.    Allergies  Allergen Reactions   Amoxicillin-Pot Clavulanate     REACTION: Throat swelling.   Azithromycin     REACTION: Throat Swelling   Cefazolin     REACTION: Throat Swelling   Moxifloxacin     REACTION: Throat Swelling   Penicillins     REACTION: throat swelling   Sulfonamide Derivatives     Family History  Problem Relation Age of Onset   Hypertension Mother    Hyperlipidemia Mother    Arthritis Mother    Hearing loss Mother    Other Father        DDD, colectomy,cardiac stent, afib   Colon polyps Father    Anxiety disorder Father    COPD Father        died due to complications from Covid-19   Depression Father    Diabetes Father    Heart disease Father    Hypertension Brother    Diabetes Maternal Grandmother    Diabetes Paternal Grandmother    Colon cancer Neg Hx    Stomach cancer Neg Hx    Kidney disease Neg Hx     Social History   Tobacco Use   Smoking status: Never   Smokeless tobacco: Never  Vaping Use   Vaping status: Never Used  Substance Use Topics   Alcohol use: Yes    Alcohol/week: 5.0 standard drinks of alcohol    Types: 5 Cans of beer per week   Drug use: No    ROS: As per history of present illness, otherwise negative  BP 110/70 (BP Location: Left Arm, Patient Position: Sitting,  Cuff Size: Normal)   Pulse 72   Ht 5' 6.75" (1.695 m) Comment: height measured without shoes  Wt 206 lb 5 oz (93.6 kg)   LMP 09/23/2011   BMI 32.56 kg/m  Gen: awake, alert, NAD HEENT: anicteric  CV: RRR, no mrg Pulm: CTA b/l Abd: soft, NT/ND, +BS throughout Ext: no c/c/e Neuro: nonfocal   RELEVANT LABS AND IMAGING: CBC    Component Value Date/Time   WBC 7.8 02/25/2024 1421   RBC 4.70 02/25/2024 1421   HGB 14.1 02/25/2024 1421   HCT 43.3 02/25/2024 1421   PLT 275.0 02/25/2024 1421  MCV 92.1 02/25/2024 1421   MCH 30.9 04/18/2016 1602   MCHC 32.5 02/25/2024 1421   RDW 13.4 02/25/2024 1421   LYMPHSABS 1.8 02/25/2024 1421   MONOABS 0.6 02/25/2024 1421   EOSABS 0.1 02/25/2024 1421   BASOSABS 0.1 02/25/2024 1421    CMP     Component Value Date/Time   NA 140 02/25/2024 1421   K 5.0 02/25/2024 1421   CL 103 02/25/2024 1421   CO2 29 02/25/2024 1421   GLUCOSE 87 02/25/2024 1421   BUN 12 02/25/2024 1421   CREATININE 0.63 02/25/2024 1421   CREATININE 0.69 04/18/2016 1602   CALCIUM  9.6 02/25/2024 1421   PROT 7.3 02/25/2024 1421   ALBUMIN 4.5 02/25/2024 1421   AST 19 02/25/2024 1421   ALT 18 02/25/2024 1421   ALKPHOS 88 02/25/2024 1421   BILITOT 0.6 02/25/2024 1421   GFRNONAA >90 02/28/2013 0413   GFRNONAA >60 04/24/2011 0902   GFRAA >90 02/28/2013 0413   GFRAA >60 04/24/2011 0902   LABS Stool studies: Negative for Salmonella, Campylobacter, E. coli, ova and parasite, toxigenic C. diff by PCR, and Shigella (03/10/2024) Stool lactoferrin: Negative (03/10/2024) AST: 19 (12/28/2023) ALT: 18 (12/28/2023) Albumin: 4.5 (12/28/2023) Total bilirubin: 0.6 (12/28/2023) Alkaline phosphatase: 88 (12/28/2023) WBC: 7.8 (12/28/2023) Hb: 14.1 (12/28/2023) MCV: 92.1 (12/28/2023) PLT: 275 (12/28/2023) TSH: 2.00 (12/28/2023) HbA1c: 5.8 (12/28/2023) Vitamin D : 20.83 (12/28/2023)  DIAGNOSTIC Colonoscopy: Five polyps ranging from 4 to 7 mm, internal hemorrhoids, four tubular  adenomas, one polyp without dysplasia (10/28/2019)  ASSESSMENT/PLAN: 63 year old female with a history of colonic polyps, IBS with alternating bowel habits, GERD, Sjogren syndrome, hypertension and hyperlipidemia who is here for follow-up and to reestablish care after 5 years.  Irritable Bowel Syndrome with Diarrhea (IBS-D) Chronic diarrhea linked to stress and travel. Negative stool studies. Symptoms align with IBS-D. Lomotil  causes constipation if overused. Levsin effective for symptom control. Counseling for depression and grief initiated. - Refilled Levsin 0.125 mg, 1-2 tablets every 8 hours as needed. - Continue Lomotil  1 tablet TID as needed. - Colonoscopy scheduled in August for polyps and inflammation assessment.  Colonic Polyps Previous colonoscopy showed tubular adenomas and a polyp without dysplasia. Family history of advanced adenoma necessitates repeat colonoscopy.  Overdue for surveillance - Colonoscopy scheduled in August for polyp monitoring and inflammation assessment.  Gastroesophageal Reflux Disease (GERD) Increased heartburn and indigestion. Small hiatal hernia contributes to reflux. Pantoprazole  effective but additional management needed for breakthrough symptoms. - Refilled pantoprazole  40 mg daily. - Use famotidine 20 mg in the evening as needed.   ZO:XWRUE, Bonita Bussing, Md [address] [place],  [place] [address]

## 2024-06-03 ENCOUNTER — Ambulatory Visit (INDEPENDENT_AMBULATORY_CARE_PROVIDER_SITE_OTHER): Admitting: Behavioral Health

## 2024-06-03 DIAGNOSIS — F321 Major depressive disorder, single episode, moderate: Secondary | ICD-10-CM | POA: Diagnosis not present

## 2024-06-03 DIAGNOSIS — F4381 Prolonged grief disorder: Secondary | ICD-10-CM

## 2024-06-03 DIAGNOSIS — F4329 Adjustment disorder with other symptoms: Secondary | ICD-10-CM

## 2024-06-07 ENCOUNTER — Ambulatory Visit: Admitting: Behavioral Health

## 2024-06-24 ENCOUNTER — Other Ambulatory Visit: Payer: Self-pay | Admitting: Family Medicine

## 2024-06-25 ENCOUNTER — Other Ambulatory Visit: Payer: Self-pay | Admitting: Family Medicine

## 2024-07-10 NOTE — Assessment & Plan Note (Signed)
 Encourage heart healthy diet such as MIND or DASH diet, increase exercise, avoid trans fats, simple carbohydrates and processed foods, consider a krill or fish or flaxseed oil cap daily.

## 2024-07-10 NOTE — Assessment & Plan Note (Signed)
 Supplement and monitor

## 2024-07-10 NOTE — Assessment & Plan Note (Signed)
 On Levothyroxine, continue to monitor

## 2024-07-10 NOTE — Assessment & Plan Note (Signed)
 Well controlled, no changes to meds. Encouraged heart healthy diet such as the DASH diet and exercise as tolerated.

## 2024-07-10 NOTE — Assessment & Plan Note (Signed)
 [id number] acceptable, minimize simple carbs. Increase exercise as tolerated.

## 2024-07-11 ENCOUNTER — Ambulatory Visit: Payer: Self-pay | Admitting: Family Medicine

## 2024-07-11 ENCOUNTER — Encounter: Payer: Self-pay | Admitting: Family Medicine

## 2024-07-11 ENCOUNTER — Ambulatory Visit: Admitting: Family Medicine

## 2024-07-11 VITALS — BP 118/76 | HR 72 | Resp 16 | Ht 66.75 in | Wt 202.6 lb

## 2024-07-11 DIAGNOSIS — R739 Hyperglycemia, unspecified: Secondary | ICD-10-CM

## 2024-07-11 DIAGNOSIS — E559 Vitamin D deficiency, unspecified: Secondary | ICD-10-CM | POA: Diagnosis not present

## 2024-07-11 DIAGNOSIS — I10 Essential (primary) hypertension: Secondary | ICD-10-CM | POA: Diagnosis not present

## 2024-07-11 DIAGNOSIS — E782 Mixed hyperlipidemia: Secondary | ICD-10-CM | POA: Diagnosis not present

## 2024-07-11 DIAGNOSIS — G4700 Insomnia, unspecified: Secondary | ICD-10-CM

## 2024-07-11 DIAGNOSIS — E039 Hypothyroidism, unspecified: Secondary | ICD-10-CM

## 2024-07-11 LAB — CBC WITH DIFFERENTIAL/PLATELET
Basophils Absolute: 0 K/uL (ref 0.0–0.1)
Basophils Relative: 0.4 % (ref 0.0–3.0)
Eosinophils Absolute: 0.1 K/uL (ref 0.0–0.7)
Eosinophils Relative: 1.5 % (ref 0.0–5.0)
HCT: 41.9 % (ref 36.0–46.0)
Hemoglobin: 13.7 g/dL (ref 12.0–15.0)
Lymphocytes Relative: 24.2 % (ref 12.0–46.0)
Lymphs Abs: 1.7 K/uL (ref 0.7–4.0)
MCHC: 32.8 g/dL (ref 30.0–36.0)
MCV: 90.4 fl (ref 78.0–100.0)
Monocytes Absolute: 0.5 K/uL (ref 0.1–1.0)
Monocytes Relative: 7 % (ref 3.0–12.0)
Neutro Abs: 4.8 K/uL (ref 1.4–7.7)
Neutrophils Relative %: 66.9 % (ref 43.0–77.0)
Platelets: 279 K/uL (ref 150.0–400.0)
RBC: 4.63 Mil/uL (ref 3.87–5.11)
RDW: 13.5 % (ref 11.5–15.5)
WBC: 7.2 K/uL (ref 4.0–10.5)

## 2024-07-11 LAB — COMPREHENSIVE METABOLIC PANEL WITH GFR
ALT: 16 U/L (ref 0–35)
AST: 17 U/L (ref 0–37)
Albumin: 4.4 g/dL (ref 3.5–5.2)
Alkaline Phosphatase: 94 U/L (ref 39–117)
BUN: 9 mg/dL (ref 6–23)
CO2: 29 meq/L (ref 19–32)
Calcium: 9.4 mg/dL (ref 8.4–10.5)
Chloride: 101 meq/L (ref 96–112)
Creatinine, Ser: 0.54 mg/dL (ref 0.40–1.20)
GFR: 97.38 mL/min
Glucose, Bld: 95 mg/dL (ref 70–99)
Potassium: 4 meq/L (ref 3.5–5.1)
Sodium: 138 meq/L (ref 135–145)
Total Bilirubin: 0.7 mg/dL (ref 0.2–1.2)
Total Protein: 7.1 g/dL (ref 6.0–8.3)

## 2024-07-11 LAB — LIPID PANEL
Cholesterol: 159 mg/dL (ref 0–200)
HDL: 58.9 mg/dL (ref >39.00)
LDL Cholesterol: 69 mg/dL (ref 0–99)
NonHDL: 99.74
Total CHOL/HDL Ratio: 3
Triglycerides: 155 mg/dL — ABNORMAL HIGH (ref 0.0–149.0)
VLDL: 31 mg/dL (ref 0.0–40.0)

## 2024-07-11 LAB — VITAMIN D 25 HYDROXY (VIT D DEFICIENCY, FRACTURES): VITD: 22.37 ng/mL — ABNORMAL LOW (ref 30.00–100.00)

## 2024-07-11 LAB — TSH: TSH: 1.74 u[IU]/mL (ref 0.35–5.50)

## 2024-07-11 LAB — HEMOGLOBIN A1C: Hgb A1c MFr Bld: 6 % (ref 4.6–6.5)

## 2024-07-11 MED ORDER — ARIPIPRAZOLE 2 MG PO TABS: 2.0000 mg | ORAL_TABLET | Freq: Every day | ORAL | 2 refills | Status: DC

## 2024-07-11 MED ORDER — SERTRALINE HCL 100 MG PO TABS: 100.0000 mg | ORAL_TABLET | Freq: Every morning | ORAL | Status: AC

## 2024-07-11 NOTE — Patient Instructions (Addendum)
 [facility] Managing Bipolar Disorder If you have been diagnosed with bipolar disorder, you may be relieved to now know that this is why you have felt or behaved a certain way. You may also feel overwhelmed about the treatment ahead, how to get needed support, and how to deal with your condition each day. With care and support, you can learn to manage your symptoms and live with your condition. How to manage lifestyle changes Managing stress Stress is your body's reaction to life changes and events, both good and bad. Stress can affect your condition, so it is important to learn how to manage stress. Some techniques to help manage stress include: Meditation, muscle relaxation, and breathing exercises. Exercise. Even a short daily walk can help to lower stress levels. Getting enough good-quality sleep. Too little sleep can trigger the emotional highs of mania. Making a schedule to manage your time. A daily schedule can help keep you from feeling overwhelmed by tasks and deadlines. Spending time on hobbies you enjoy.  Medicines Your health care provider may suggest certain medicines if he or she thinks that these will help improve your condition. Avoid using caffeine, alcohol, and other substances that may prevent your medicines from working properly. Be sure to: Talk with your pharmacist or health care provider about all the medicines that you take, their possible side effects, and which medicines are safe to take together. Make it your goal to take part in all treatment decisions with your health care provider (shared decision-making). Ask about possible side effects of medicines, and share how you feel about having those side effects. It is best if shared decision-making is part of your total treatment plan. If you are taking medicines as part of your treatment, do not stop taking them before asking if it is safe to stop. You may need to have the medicine slowly decreased (tapered) over time to  lower the risk of harmful side effects. Relationships Spend time with people whom you trust and with whom you feel a sense of understanding and calm. Try to find friends or family members who make you feel safe and can help you control feelings of mania. Consider going to couples counseling, family education classes, or family therapy to: Teach your loved ones about your condition and suggest ways they can support you. Help resolve conflicts. Help develop communication skills in your relationships.  How to recognize changes in your condition Everyone responds differently to treatment for bipolar disorder. Signs that your condition is improving include: Leveling of your mood. You may have less anger and excitement about daily activities, and your low moods may not be as bad. Your symptoms being less intense. Feeling calm more often. Thinking clearly. Not experiencing consequences for extreme behavior. Feeling like your life is settling down. Your behavior seeming more normal to you and to other people. Signs that your condition may be getting worse include: Sleep problems. Moods cycling between deep lows and unusually high energy. Extreme emotions, or more anger toward loved ones. Staying away from others, or isolating yourself. A feeling of power or superiority. Completing a lot of tasks in a very short amount of time. Unusual thoughts and behaviors. Thoughts of suicide. Follow these instructions at home: Medicines Take over-the-counter and prescription medicines only as told by your health care provider or pharmacist. Ask your pharmacist what over-the-counter cold medicines you should avoid. Some medicines can make symptoms worse. General instructions Ask for support from trusted family members or friends to make sure you  stay on track with your treatment. Keep a journal to write down your daily moods, medicines, sleep habits, and life events. This may bring you more success with your  treatment. Make and follow a routine for daily meal times. Eat healthy foods, such as whole grains, vegetables, and fresh fruit. Try to go to sleep and wake up around the same time every day. Avoid using products that contain nicotine or tobacco. If you want help quitting, ask your health care provider. Keep all follow-up visits. This is important. Where to find support Helplines and other support Substance Abuse and Mental Health Services Administration [facility]) National Helpline: 1-800-662-HELP (4357) National Alliance on Mental Illness (NAMI) HelpLine: 1-800-950-NAMI (6264) or email helpline@nami .org Depression and Bipolar Support Alliance: dbsalliance.org Talking to others Try making a list of the people you may want to tell about your condition, such as the people you trust most. Plan what you are willing and not willing to talk about. Think about your needs ahead of time and how others can support you. Let your loved ones know when they can share advice and when you would just like them to listen. Give your loved ones information about bipolar disorder and encourage them to learn about the condition. Finances Not all insurance plans provide the same coverage for mental health care, so it is important to check with your insurance carrier. If paying for co-pays or counseling services is a problem, search for a local or county mental health care center. Public mental health care services may be offered there at a low cost or no cost when you are not able to see a private health care provider. If you are taking medicine for depression, you may be able to get the generic form, which may cost less than brand-name medicine. Some makers of prescription medicines also offer help to people who cannot afford the medicines they need. Questions to ask your health care provider: If you are taking medicines How long do I need to take medicine? Are there any long-term side effects of my medicine? Are  there any alternatives to taking medicine? Other questions How would I benefit from therapy? How often should I follow up with a health care provider? Contact a health care provider if: Your symptoms get worse or they do not get better with treatment. Get help right away if: You have thoughts about harming yourself or others. If you ever feel like you may hurt yourself or others, or have thoughts about taking your own life, get help right away. Go to your nearest emergency department or: Call your local emergency services (911 in the [place] Call a suicide crisis helpline, such as the National Suicide Prevention Lifeline at (902)296-5100 or 988 in the [place] This is open 24 hours a day in the [place] If you're a Veteran: Call 988 and press 1. This is open 24 hours a day. Text the [employer] at 229-522-9142. Summary Stress can affect your condition, so it is important to learn how to manage stress. Aim to take part in all treatment decisions (shared decision-making). Teach your loved ones about your condition and suggest ways they can support you. Contact a health care provider if your symptoms get worse or they do not get better with treatment. This information is not intended to replace advice given to you by your health care provider. Make sure you discuss any questions you have with your health care provider. Document Revised: 07/23/2023 Document Reviewed: 05/30/2021 Elsevier Patient Education  2024 [employer]

## 2024-07-11 NOTE — Progress Notes (Signed)
 Subjective:    Patient ID: Kathy Huerta, female    DOB: [birth year], 63 y.o.   MRN: [id number]  Chief Complaint  Patient presents with   Medical Management of Chronic Issues    Patient presents today for a 2 month follow-up.   Quality Metric Gaps    Colonoscopy, HIV screening    HPI Discussed the use of AI scribe software for clinical note transcription with the patient, who gave verbal consent to proceed.  History of Present Illness Kathy Huerta is a 63 year old female who presents with concerns about mood instability and recent panic attacks.  Approximately two weeks ago, she experienced a significant panic attack, described as 'horrific', without any identifiable trigger. This was her first panic attack in several months. Since then, she has had increased episodes of crying without a clear cause.  She is contemplating whether she might have bipolar disorder due to periods of high energy and productivity followed by low periods. She describes episodes where she feels 'blah, blah, blah' and is very active, though these are infrequent. She also reports increased irritability and losing her temper over minor issues, and wonders if this may be related to recent changes in her medication.  She is currently taking sertraline  100 mg daily and venlafaxine  75 mg daily. She has noticed a significant improvement in her mood since starting these medications but feels she has reached a plateau.  There is a family history of atrial fibrillation in her father and brother. She has experienced episodes of heart racing while at rest. These episodes are not accompanied by chest pain or breathing difficulties and typically resolve quickly.  She is focusing on improving her hydration habits by drinking water consistently throughout the day. She is also working on improving her sleep by avoiding tablet use after 6 PM and engaging in relaxing activities like knitting before bed.    Past Medical History:   Diagnosis Date   Allergy 2010   Anxiety    Arthritis of right hip 10/28/2016   Basal cell carcinoma 11/17/2011   Dr. Veryl   Cataract 2010   Colon polyps    Cutaneous skin tags 07/24/2013   DDD (degenerative disc disease), cervical    s/p fusion   DDD (degenerative disc disease), thoracolumbar    on disability   Depression    Headache(784.0) 01/29/2013   Hearing loss    Saw ENT on 04/27/2017; Moderate hearing loss in both ears.   Heartburn    Hiatal hernia    HTN (hypertension) 2002   Hyperlipemia 08/2014   Hyperplastic colon polyp    Hypothyroid    Internal hemorrhoids    Obesity 04/30/2017   Pain in joint, shoulder region 11/20/2013   Pancreatitis 04/24/2011   Pneumonia 07/24/2013   Preventative health care 10/28/2016   Preventative health care 10/28/2016   Tachycardia 08/12/2014   Vitamin D  deficiency 11/18/2015    Past Surgical History:  Procedure Laterality Date   ANTERIOR FUSION CERVICAL SPINE  12/22/2006   C6-7  Dr Onetha   CHOLECYSTECTOMY N/A 02/27/2013   Procedure: LAPAROSCOPIC CHOLECYSTECTOMY WITH INTRAOPERATIVE CHOLANGIOGRAM;  Surgeon: Elspeth KYM Schultze, MD;  Location: [facility];  Service: General;  Laterality: N/A;   POSTERIOR FUSION CERVICAL SPINE  12/22/2009   C6-7   SPINE SURGERY  2009    Family History  Problem Relation Age of Onset   Hypertension Mother    Hyperlipidemia Mother    Arthritis Mother    Hearing loss Mother  Other Father        DDD, colectomy,cardiac stent, afib   Colon polyps Father    Anxiety disorder Father    COPD Father        died due to complications from Covid-19   Depression Father    Diabetes Father    Heart disease Father    Hypertension Brother    Diabetes Maternal Grandmother    Diabetes Paternal Grandmother    Colon cancer Neg Hx    Stomach cancer Neg Hx    Kidney disease Neg Hx     Social History   Socioeconomic History   Marital status: Widowed    Spouse name: Not on file   Number of children: 0    Years of education: Not on file   Highest education level: Bachelor's degree (e.g., BA, AB, BS)  Occupational History   Occupation: Disabled    Employer: UNEMPLOYED  Tobacco Use   Smoking status: Never   Smokeless tobacco: Never  Vaping Use   Vaping status: Never Used  Substance and Sexual Activity   Alcohol use: Yes    Alcohol/week: 5.0 standard drinks of alcohol    Types: 5 Cans of beer per week   Drug use: No   Sexual activity: Not Currently  Other Topics Concern   Not on file  Social History Narrative   Reviewed history and no changes required.   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   Regular exercise-no   Smoking Status:  quit   Caffeine use/day:  none   Does Patient Exercise:  no         Social Drivers of Corporate investment banker Strain: Low Risk  (07/07/2024)   Overall Financial Resource Strain (CARDIA)    Difficulty of Paying Living Expenses: Not very hard  Food Insecurity: No Food Insecurity (07/07/2024)   Hunger Vital Sign    Worried About Running Out of Food in the Last Year: Never true    Ran Out of Food in the Last Year: Never true  Transportation Needs: No Transportation Needs (07/07/2024)   PRAPARE - Administrator, Civil Service (Medical): No    Lack of Transportation (Non-Medical): No  Physical Activity: Inactive (07/07/2024)   Exercise Vital Sign    Days of Exercise per Week: 0 days    Minutes of Exercise per Session: Not on file  Stress: Stress Concern Present (07/07/2024)   [employer] of Occupational Health - Occupational Stress Questionnaire    Feeling of Stress: Rather much  Social Connections: Socially Isolated (07/07/2024)   Social Connection and Isolation Panel    Frequency of Communication with Friends and Family: More than three times a week    Frequency of Social Gatherings with Friends and Family: Three times a week     Attends Religious Services: Never    Active Member of Clubs or Organizations: No    Attends Banker Meetings: Not on file    Marital Status: Widowed  Catering manager Violence: Not on file    Outpatient Medications Prior to Visit  Medication Sig Dispense Refill   atorvastatin  (LIPITOR) 40 MG tablet TAKE 1 TABLET BY MOUTH DAILY 90 tablet 1   diclofenac  Sodium (VOLTAREN ) 1 % GEL Apply 2 g topically as needed.     diphenoxylate -atropine  (LOMOTIL ) 2.5-0.025 MG tablet TAKE ONE TO TWO TABLETS BY MOUTH FOUR TIMES DAILY  AS NEEDED FOR DIARRHEA OR LOOSE STOOLS MAX OF 8 TABLETS PER 24 HOURS AS DIRECTED 30 tablet 1   hyoscyamine  (LEVSIN  SL) 0.125 MG SL tablet Take 1-2 tablets under tongue every 6 hours as needed for IBS 90 tablet 1   levothyroxine  (SYNTHROID ) 50 MCG tablet TAKE 1 TABLET BY MOUTH DAILY BEFORE BREAKFAST 30 tablet 1   losartan  (COZAAR ) 25 MG tablet TAKE 1 TABLET BY MOUTH DAILY 90 tablet 1   pantoprazole  (PROTONIX ) 40 MG tablet Take 1 tablet (40 mg total) by mouth daily. 90 tablet 3   propranolol  (INDERAL ) 40 MG tablet TAKE 1 TABLET BY MOUTH 2 TIMES A DAY 180 tablet 1   venlafaxine  XR (EFFEXOR -XR) 75 MG 24 hr capsule TAKE 1 CAPSULE BY MOUTH DAILY WITH BREAKFAST 30 capsule 3   zolpidem  (AMBIEN ) 10 MG tablet Take 1 tablet (10 mg total) by mouth at bedtime as needed. for sleep 30 tablet 2   sertraline  (ZOLOFT ) 100 MG tablet TAKE 2 TABLETS BY MOUTH EVERY MORNING 180 tablet 1   Vitamin D , Ergocalciferol , (DRISDOL ) 1.25 MG (50000 UNIT) CAPS capsule Take 1 capsule (50,000 Units total) by mouth every 7 (seven) days. 4 capsule 4   No facility-administered medications prior to visit.    Allergies  Allergen Reactions   Amoxicillin-Pot Clavulanate     REACTION: Throat swelling.   Azithromycin     REACTION: Throat Swelling   Cefazolin     REACTION: Throat Swelling   Moxifloxacin     REACTION: Throat Swelling   Penicillins     REACTION: throat swelling   Sulfonamide Derivatives      Review of Systems  Constitutional:  Negative for fever and malaise/fatigue.  HENT:  Negative for congestion.   Eyes:  Negative for blurred vision.  Respiratory:  Negative for shortness of breath.   Cardiovascular:  Positive for palpitations. Negative for chest pain and leg swelling.  Gastrointestinal:  Negative for abdominal pain, blood in stool and nausea.  Genitourinary:  Negative for dysuria and frequency.  Musculoskeletal:  Negative for falls.  Skin:  Negative for rash.  Neurological:  Negative for dizziness, loss of consciousness and headaches.  Endo/Heme/Allergies:  Negative for environmental allergies.  Psychiatric/Behavioral:  Negative for depression and memory loss. The patient is nervous/anxious and has insomnia.        Objective:    Physical Exam Constitutional:      General: She is not in acute distress.    Appearance: Normal appearance. She is well-developed. She is not toxic-appearing.  HENT:     Head: Normocephalic and atraumatic.     Right Ear: External ear normal.     Left Ear: External ear normal.     Nose: Nose normal.  Eyes:     General:        Right eye: No discharge.        Left eye: No discharge.     Conjunctiva/sclera: Conjunctivae normal.  Neck:     Thyroid : No thyromegaly.  Cardiovascular:     Rate and Rhythm: Normal rate and regular rhythm.     Heart sounds: Normal heart sounds. No murmur heard. Pulmonary:     Effort: Pulmonary effort is normal. No respiratory distress.     Breath sounds: Normal breath sounds.  Abdominal:     General: Bowel sounds are normal.     Palpations: Abdomen is soft.     Tenderness: There is no abdominal tenderness. There is no guarding.  Musculoskeletal:  General: Normal range of motion.     Cervical back: Neck supple.  Lymphadenopathy:     Cervical: No cervical adenopathy.  Skin:    General: Skin is warm and dry.  Neurological:     Mental Status: She is alert and oriented to person, place, and time.   Psychiatric:        Mood and Affect: Mood normal.        Behavior: Behavior normal.        Thought Content: Thought content normal.        Judgment: Judgment normal.     BP 118/76   Pulse 72   Resp 16   Ht 5' 6.75 (1.695 m)   Wt 202 lb 9.6 oz (91.9 kg)   LMP 09/23/2011   SpO2 97%   BMI 31.97 kg/m  Wt Readings from Last 3 Encounters:  07/11/24 202 lb 9.6 oz (91.9 kg)  05/24/24 206 lb 5 oz (93.6 kg)  05/17/24 203 lb (92.1 kg)    Diabetic Foot Exam - Simple   No data filed    Lab Results  Component Value Date   WBC 7.8 02/25/2024   HGB 14.1 02/25/2024   HCT 43.3 02/25/2024   PLT 275.0 02/25/2024   GLUCOSE 87 02/25/2024   CHOL 165 02/25/2024   TRIG 155.0 (H) 02/25/2024   HDL 57.50 02/25/2024   LDLDIRECT 85.0 09/24/2022   LDLCALC 76 02/25/2024   ALT 18 02/25/2024   AST 19 02/25/2024   NA 140 02/25/2024   K 5.0 02/25/2024   CL 103 02/25/2024   CREATININE 0.63 02/25/2024   BUN 12 02/25/2024   CO2 29 02/25/2024   TSH 2.00 02/25/2024   HGBA1C 5.8 02/25/2024    Lab Results  Component Value Date   TSH 2.00 02/25/2024   Lab Results  Component Value Date   WBC 7.8 02/25/2024   HGB 14.1 02/25/2024   HCT 43.3 02/25/2024   MCV 92.1 02/25/2024   PLT 275.0 02/25/2024   Lab Results  Component Value Date   NA 140 02/25/2024   K 5.0 02/25/2024   CO2 29 02/25/2024   GLUCOSE 87 02/25/2024   BUN 12 02/25/2024   CREATININE 0.63 02/25/2024   BILITOT 0.6 02/25/2024   ALKPHOS 88 02/25/2024   AST 19 02/25/2024   ALT 18 02/25/2024   PROT 7.3 02/25/2024   ALBUMIN 4.5 02/25/2024   CALCIUM  9.6 02/25/2024   GFR 94.07 02/25/2024   Lab Results  Component Value Date   CHOL 165 02/25/2024   Lab Results  Component Value Date   HDL 57.50 02/25/2024   Lab Results  Component Value Date   LDLCALC 76 02/25/2024   Lab Results  Component Value Date   TRIG 155.0 (H) 02/25/2024   Lab Results  Component Value Date   CHOLHDL 3 02/25/2024   Lab Results  Component  Value Date   HGBA1C 5.8 02/25/2024       Assessment & Plan:  Hyperglycemia Assessment & Plan: hgba1c acceptable, minimize simple carbs. Increase exercise as tolerated.   Orders: -     Hemoglobin A1c  Mixed hyperlipidemia Assessment & Plan: Encourage heart healthy diet such as MIND or DASH diet, increase exercise, avoid trans fats, simple carbohydrates and processed foods, consider a krill or fish or flaxseed oil cap daily.   Orders: -     Lipid panel  Primary hypertension Assessment & Plan: Well controlled, no changes to meds. Encouraged heart healthy diet such as the DASH diet and  exercise as tolerated.   Orders: -     Comprehensive metabolic panel with GFR -     CBC with Differential/Platelet  Hypothyroidism, unspecified type Assessment & Plan: On Levothyroxine , continue to monitor  Orders: -     TSH  Vitamin D  deficiency Assessment & Plan: Supplement and monitor   Orders: -     VITAMIN D  25 Hydroxy (Vit-D Deficiency, Fractures)  Insomnia, unspecified type -     Drug Monitoring Panel [phone] , Urine  Other orders -     Sertraline  HCl; Take 1 tablet (100 mg total) by mouth every morning. -     ARIPiprazole ; Take 1 tablet (2 mg total) by mouth daily.  Dispense: 30 tablet; Refill: 2    Assessment and Plan Assessment & Plan Bipolar Disorder Mood disorder questionnaire indicated potential bipolar disorder. Discussed risk of hypomania and necessity of mood stabilizers to prevent exacerbation by current medications. Explained psychiatry referral for comprehensive evaluation. Her MDGI had 9 of 13 yes in question #1, yes to question #2 and minor problem to question #3. No FH of bipolar and no previous diagnosis - Start Abilify  2 mg in the evenings. - Monitor mood symptoms and adjust treatment as necessary. - Consider psychiatry referral if symptoms persist or worsen.  Insomnia Discussed potential benefits of mood stabilizers like Abilify  for improving sleep. -  Continue current sleep hygiene practices. - Monitor sleep quality with the introduction of Abilify .  Tachycardia Episodes of heart racing, particularly during anxiety. Discussed potential panic attacks and challenges of capturing arrhythmias with short-term monitoring. Explained KardiaMobile as a tool to capture episodes. - Consider using KardiaMobile to monitor heart rhythm during episodes. - Monitor symptoms and report any changes or worsening.  Vitamin D  Deficiency Discussed benefits of daily vitamin D  supplementation over weekly dosing for more stable levels. - Order blood work to check vitamin D  levels. - Consider switching to daily over-the-counter vitamin D  supplementation based on lab results.     Harlene Horton, MD

## 2024-07-13 LAB — DRUG MONITORING PANEL 376104, URINE
Amphetamines: NEGATIVE ng/mL (ref <500)
Barbiturates: NEGATIVE ng/mL (ref <300)
Benzodiazepines: NEGATIVE ng/mL (ref <100)
Cocaine Metabolite: NEGATIVE ng/mL (ref <150)
Desmethyltramadol: NEGATIVE ng/mL (ref <100)
Opiates: NEGATIVE ng/mL (ref <100)
Oxycodone: NEGATIVE ng/mL (ref <100)
Tramadol: NEGATIVE ng/mL (ref <100)

## 2024-07-13 LAB — DM TEMPLATE

## 2024-07-19 NOTE — Progress Notes (Signed)
 [facility] Counselor/Therapist Progress Note  Patient ID: Kathy Huerta, MRN: [id number],    Date: 03/29/2024  Time Spent: 50 min In Person @ [facility] Office Time In: 2:00pm Time Out: 2:50pm   Treatment Type: Individual Therapy  Reported Symptoms: Inc in stress, anx/dep with Mother's pending move into Asst'd Living, rehoming of her dog, & Bros Mark's situation. Grief over Husb's death 3 yrs ago is fairly acute; Pt's sleep has been disrupted since that time.   Mental Status Exam: Appearance:  Casual     Behavior: Appropriate and Sharing  Motor: Normal  Speech/Language:  Clear and Coherent  Affect: Appropriate  Mood: depressed and sad  Thought process: normal  Thought content:   WNL  Sensory/Perceptual disturbances:   WNL  Orientation: oriented to person, place, time/date, and situation  Attention: Good  Concentration: Good  Memory: WNL  Fund of knowledge:  Good  Insight:   Good  Judgment:  Good  Impulse Control: Good   Risk Assessment: Danger to Self:  No Self-injurious Behavior: No Danger to Others: No Duty to Warn:no Physical Aggression / Violence:No  Access to Firearms a concern: No  Gang Involvement:No   Subjective: Pt is feeling a caregiver burden today as the responsibility to secure her Mother's move into Asst'd Living weighs on her. She would like more help from her Bros, but he does not live locally.    Pt feels she is improved in some ways, as she has found herself humming. Her grief is not as intensely difficult as it has been in previous sessions, but she realizes she is also distracted by her Mother's needs. She is an Educational psychologist lover & esp'ly upset over not being able to take her Mother's dog. She is hoping this will work out.  Interventions: Grief Therapy and Family Systems  Diagnosis:Current moderate episode of major depressive disorder without prior episode (HCC)  Prolonged grief reaction  Plan: Kathy Huerta is doing her best w/all the demands  on her @ this time. She feels overwhelmed @ times, but the medication is also helping her cope. We discussed how easily sleep can be disrupted after the loss of a loved one, esp'ly a Spouse you have known for many yrs. Tasheka will write more in her Notebook & describe the many ways she sees her Husb in her life. She will create a Scientist, research (physical sciences) where she can actively grieve him through pictures, words, colors, & stories.  Target Date: 04/21/2024  Progress:6  Frequency: Once every 2-3 wks  Modality: [name], LMFT

## 2024-07-24 ENCOUNTER — Encounter: Payer: Self-pay | Admitting: Family Medicine

## 2024-07-25 NOTE — Telephone Encounter (Signed)
 From last note:  Tachycardia Episodes of heart racing, particularly during anxiety. Discussed potential panic attacks and challenges of capturing arrhythmias with short-term monitoring. Explained KardiaMobile as a tool to capture episodes. - Consider using KardiaMobile to monitor heart rhythm during episodes. - Monitor symptoms and report any changes or worsening.

## 2024-07-26 ENCOUNTER — Ambulatory Visit: Admitting: Behavioral Health

## 2024-07-26 DIAGNOSIS — F4329 Adjustment disorder with other symptoms: Secondary | ICD-10-CM

## 2024-07-26 DIAGNOSIS — F321 Major depressive disorder, single episode, moderate: Secondary | ICD-10-CM | POA: Diagnosis not present

## 2024-07-26 DIAGNOSIS — F4320 Adjustment disorder, unspecified: Secondary | ICD-10-CM

## 2024-07-26 NOTE — Progress Notes (Signed)
 [facility] Counselor/Therapist Progress Note  Patient ID: Kathy Huerta, MRN: [id number],    Date: 07/26/2024  Time Spent: 50 min In Person @ [facility] Office Time In: 2:00pm Time Out: 2:50pm   Treatment Type: Individual Therapy  Reported Symptoms: Pt is concerned for the Dx of Bipolar d/o. She is examining her beh lately that seems out of character for herself. She is exp'g some temper flares.   Mental Status Exam: Appearance:  Casual; wearing a T-Shirt of her late Husb Les  Behavior: Appropriate and Sharing; Pt has elevated energy & CNS activation  Motor: Normal  Speech/Language:  Clear and Coherent  Affect: Appropriate  Mood: anxious  Thought process: normal  Thought content:   WNL  Sensory/Perceptual disturbances:   WNL  Orientation: oriented to person, place, time/date, and situation  Attention: Good  Concentration: Good  Memory: WNL  Fund of knowledge:  Good  Insight:   Good  Judgment:  Good  Impulse Control: Good   Risk Assessment: Danger to Self:  No Self-injurious Behavior: No Danger to Others: No Duty to Warn:no Physical Aggression / Violence:No  Access to Firearms a concern: No  Gang Involvement:No   Subjective: Pt completed a MDQ w/her PCP recently & is concerned for this Dx. She reports a score of 9?13. She has also had a panic attack in her car that was triggered by a [employer], reminding her of her late Ludie Payment' death. For the past 3 nights she has not gotten any sleep; this means up for 72 hrs straight. She denies any risky beh or Hx of other sleep disturbance. Pt labels inc'd HR & palpitations as 'A-Fib'. PCP wants her to wear a monitor for this to test validity.    Discussed Pt stress levels & the need to again be consistent w/psychotherapy as she reports she always feels improved after visits. Pt enjoys knotting-it is relazxng.  She will get 2 packs of cards @ [facility] [employer] to play 'War' w/her Mom providing  entertainment & companionship.  Interventions: Grief Therapy and Family Systems  Diagnosis:Current moderate episode of major depressive disorder without prior episode (HCC)  Prolonged grief reaction  Plan: We will review the MDQ next visit & place this in perspective for Pt, considering the concerns of her PCP. She will examine the things discussed today further after session & note her inc'd stress getting her Mother moved into Skilled Nsg care w/minimal help from her Bros & SIL. She will also realized the caregiver burden any person faces when a Parent or Family member relies on them fully for support.  Target Date: 08/20/2024  Progress: 5  Frequency: Once every 2-3 wks  Modality: [name], LMFT

## 2024-07-29 ENCOUNTER — Other Ambulatory Visit: Payer: Self-pay | Admitting: Family

## 2024-07-29 ENCOUNTER — Encounter: Payer: Self-pay | Admitting: Internal Medicine

## 2024-08-08 ENCOUNTER — Encounter: Payer: Self-pay | Admitting: Internal Medicine

## 2024-08-08 ENCOUNTER — Ambulatory Visit (AMBULATORY_SURGERY_CENTER): Admitting: Internal Medicine

## 2024-08-08 VITALS — BP 135/68 | HR 70 | Temp 97.0°F | Resp 17 | Ht 66.0 in | Wt 206.0 lb

## 2024-08-08 DIAGNOSIS — D124 Benign neoplasm of descending colon: Secondary | ICD-10-CM

## 2024-08-08 DIAGNOSIS — K5730 Diverticulosis of large intestine without perforation or abscess without bleeding: Secondary | ICD-10-CM | POA: Diagnosis not present

## 2024-08-08 DIAGNOSIS — D125 Benign neoplasm of sigmoid colon: Secondary | ICD-10-CM

## 2024-08-08 DIAGNOSIS — Z1211 Encounter for screening for malignant neoplasm of colon: Secondary | ICD-10-CM

## 2024-08-08 DIAGNOSIS — K648 Other hemorrhoids: Secondary | ICD-10-CM

## 2024-08-08 DIAGNOSIS — D123 Benign neoplasm of transverse colon: Secondary | ICD-10-CM

## 2024-08-08 DIAGNOSIS — D122 Benign neoplasm of ascending colon: Secondary | ICD-10-CM

## 2024-08-08 DIAGNOSIS — Z860101 Personal history of adenomatous and serrated colon polyps: Secondary | ICD-10-CM

## 2024-08-08 DIAGNOSIS — K635 Polyp of colon: Secondary | ICD-10-CM

## 2024-08-08 DIAGNOSIS — Z860100 Personal history of colon polyps, unspecified: Secondary | ICD-10-CM

## 2024-08-08 MED ORDER — SODIUM CHLORIDE 0.9 % IV SOLN: 500.0000 mL | INTRAVENOUS | Status: DC

## 2024-08-08 NOTE — Progress Notes (Signed)
 Pt resting comfortably. VSS. Airway intact. SBAR complete to RN. All questions answered.

## 2024-08-08 NOTE — Progress Notes (Signed)
 GASTROENTEROLOGY PROCEDURE H&P NOTE   Primary Care Physician: Domenica Harlene LABOR, MD    Reason for Procedure:   Hx of polyps  Plan:    colonoscopy  Patient is appropriate for endoscopic procedure(s) in the ambulatory ([facility]) setting.  The nature of the procedure, as well as the risks, benefits, and alternatives were carefully and thoroughly reviewed with the patient. Ample time for discussion and questions allowed. The patient understood, was satisfied, and agreed to proceed.     HPI: Kathy Huerta is a 63 y.o. female who presents for colonoscopy.  Medical history as below.  Tolerated the prep.  No recent chest pain or shortness of breath.  No abdominal pain today.  Past Medical History:  Diagnosis Date   Allergy 2010   Anxiety    Arthritis of right hip 10/28/2016   Basal cell carcinoma 11/17/2011   Dr. Veryl   Cataract 2010   Colon polyps    Cutaneous skin tags 07/24/2013   DDD (degenerative disc disease), cervical    s/p fusion   DDD (degenerative disc disease), thoracolumbar    on disability   Depression    Headache(784.0) 01/29/2013   Hearing loss    Saw ENT on 04/27/2017; Moderate hearing loss in both ears.   Heartburn    Hiatal hernia    HTN (hypertension) 2002   Hyperlipemia 08/2014   Hyperplastic colon polyp    Hypothyroid    Internal hemorrhoids    Obesity 04/30/2017   Pain in joint, shoulder region 11/20/2013   Pancreatitis 04/24/2011   Pneumonia 07/24/2013   Preventative health care 10/28/2016   Preventative health care 10/28/2016   Tachycardia 08/12/2014   Vitamin D  deficiency 11/18/2015    Past Surgical History:  Procedure Laterality Date   ANTERIOR FUSION CERVICAL SPINE  12/22/2006   C6-7  Dr Onetha   CHOLECYSTECTOMY N/A 02/27/2013   Procedure: LAPAROSCOPIC CHOLECYSTECTOMY WITH INTRAOPERATIVE CHOLANGIOGRAM;  Surgeon: Elspeth KYM Schultze, MD;  Location: [facility];  Service: General;  Laterality: N/A;   POSTERIOR FUSION CERVICAL SPINE  12/22/2009    C6-7   SPINE SURGERY  2009    Prior to Admission medications   Medication Sig Start Date End Date Taking? Authorizing Provider  atorvastatin  (LIPITOR) 40 MG tablet TAKE 1 TABLET BY MOUTH DAILY 04/04/24  Yes Domenica Harlene LABOR, MD  levothyroxine  (SYNTHROID ) 50 MCG tablet TAKE 1 TABLET BY MOUTH DAILY BEFORE BREAKFAST 06/27/24  Yes Domenica Harlene LABOR, MD  losartan  (COZAAR ) 25 MG tablet TAKE 1 TABLET BY MOUTH DAILY 05/05/24  Yes Domenica Harlene LABOR, MD  pantoprazole  (PROTONIX ) 40 MG tablet Take 1 tablet (40 mg total) by mouth daily. 05/24/24  Yes Rithik Odea, Gordy HERO, MD  propranolol  (INDERAL ) 40 MG tablet TAKE 1 TABLET BY MOUTH 2 TIMES A DAY 04/04/24  Yes Domenica Harlene LABOR, MD  sertraline  (ZOLOFT ) 100 MG tablet Take 1 tablet (100 mg total) by mouth every morning. 07/11/24  Yes Domenica Harlene LABOR, MD  venlafaxine  XR (EFFEXOR -XR) 75 MG 24 hr capsule TAKE 1 CAPSULE BY MOUTH DAILY WITH BREAKFAST 06/27/24  Yes Domenica Harlene LABOR, MD  zolpidem  (AMBIEN ) 10 MG tablet Take 1 tablet (10 mg total) by mouth at bedtime as needed. for sleep 05/10/24  Yes Domenica Harlene LABOR, MD  ARIPiprazole  (ABILIFY ) 2 MG tablet Take 1 tablet (2 mg total) by mouth daily. 07/11/24   Domenica Harlene LABOR, MD  diclofenac  Sodium (VOLTAREN ) 1 % GEL Apply 2 g topically as needed.    [provider]  diphenoxylate -atropine  (LOMOTIL ) 2.5-0.025 MG tablet TAKE ONE TO TWO TABLETS BY MOUTH FOUR TIMES DAILY AS NEEDED FOR DIARRHEA OR LOOSE STOOLS MAX OF 8 TABLETS PER 24 HOURS AS DIRECTED 04/11/24   Webb, Padonda B, FNP  hyoscyamine  (LEVSIN  SL) 0.125 MG SL tablet Take 1-2 tablets under tongue every 6 hours as needed for IBS 05/24/24   Alisabeth Selkirk, Gordy HERO, MD    Current Outpatient Medications  Medication Sig Dispense Refill   atorvastatin  (LIPITOR) 40 MG tablet TAKE 1 TABLET BY MOUTH DAILY 90 tablet 1   levothyroxine  (SYNTHROID ) 50 MCG tablet TAKE 1 TABLET BY MOUTH DAILY BEFORE BREAKFAST 30 tablet 1   losartan  (COZAAR ) 25 MG tablet TAKE 1 TABLET BY MOUTH DAILY 90 tablet 1    pantoprazole  (PROTONIX ) 40 MG tablet Take 1 tablet (40 mg total) by mouth daily. 90 tablet 3   propranolol  (INDERAL ) 40 MG tablet TAKE 1 TABLET BY MOUTH 2 TIMES A DAY 180 tablet 1   sertraline  (ZOLOFT ) 100 MG tablet Take 1 tablet (100 mg total) by mouth every morning.     venlafaxine  XR (EFFEXOR -XR) 75 MG 24 hr capsule TAKE 1 CAPSULE BY MOUTH DAILY WITH BREAKFAST 30 capsule 3   zolpidem  (AMBIEN ) 10 MG tablet Take 1 tablet (10 mg total) by mouth at bedtime as needed. for sleep 30 tablet 2   ARIPiprazole  (ABILIFY ) 2 MG tablet Take 1 tablet (2 mg total) by mouth daily. 30 tablet 2   diclofenac  Sodium (VOLTAREN ) 1 % GEL Apply 2 g topically as needed.     diphenoxylate -atropine  (LOMOTIL ) 2.5-0.025 MG tablet TAKE ONE TO TWO TABLETS BY MOUTH FOUR TIMES DAILY AS NEEDED FOR DIARRHEA OR LOOSE STOOLS MAX OF 8 TABLETS PER 24 HOURS AS DIRECTED 30 tablet 1   hyoscyamine  (LEVSIN  SL) 0.125 MG SL tablet Take 1-2 tablets under tongue every 6 hours as needed for IBS 90 tablet 1   Current Facility-Administered Medications  Medication Dose Route Frequency Provider Last Rate Last Admin   0.9 %  sodium chloride  infusion  500 mL Intravenous Continuous Charee Tumblin, Gordy HERO, MD        Allergies as of 08/08/2024 - Review Complete 08/08/2024  Allergen Reaction Noted   Amoxicillin-pot clavulanate  12/27/2010   Azithromycin  12/27/2010   Cefazolin  12/27/2010   Moxifloxacin  12/27/2010   Penicillins  12/27/2010   Sulfonamide derivatives  12/27/2010    Family History  Problem Relation Age of Onset   Hypertension Mother    Hyperlipidemia Mother    Arthritis Mother    Hearing loss Mother    Other Father        DDD, colectomy,cardiac stent, afib   Colon polyps Father    Anxiety disorder Father    COPD Father        died due to complications from Covid-19   Depression Father    Diabetes Father    Heart disease Father    Hypertension Brother    Diabetes Maternal Grandmother    Diabetes Paternal Grandmother    Colon  cancer Neg Hx    Stomach cancer Neg Hx    Kidney disease Neg Hx     Social History   Socioeconomic History   Marital status: Widowed    Spouse name: Not on file   Number of children: 0   Years of education: Not on file   Highest education level: Bachelor's degree (e.g., BA, AB, BS)  Occupational History   Occupation: Disabled    Employer: UNEMPLOYED  Tobacco Use  Smoking status: Never   Smokeless tobacco: Never  Vaping Use   Vaping status: Never Used  Substance and Sexual Activity   Alcohol use: Yes    Alcohol/week: 5.0 standard drinks of alcohol    Types: 5 Cans of beer per week   Drug use: No   Sexual activity: Not Currently  Other Topics Concern   Not on file  Social History Narrative   Reviewed history and no changes required.   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   Regular exercise-no   Smoking Status:  quit   Caffeine use/day:  none   Does Patient Exercise:  no         Social Drivers of Corporate investment banker Strain: Low Risk  (07/07/2024)   Overall Financial Resource Strain (CARDIA)    Difficulty of Paying Living Expenses: Not very hard  Food Insecurity: No Food Insecurity (07/07/2024)   Hunger Vital Sign    Worried About Running Out of Food in the Last Year: Never true    Ran Out of Food in the Last Year: Never true  Transportation Needs: No Transportation Needs (07/07/2024)   PRAPARE - Administrator, Civil Service (Medical): No    Lack of Transportation (Non-Medical): No  Physical Activity: Inactive (07/07/2024)   Exercise Vital Sign    Days of Exercise per Week: 0 days    Minutes of Exercise per Session: Not on file  Stress: Stress Concern Present (07/07/2024)   [employer] of Occupational Health - Occupational Stress Questionnaire    Feeling of Stress: Rather much  Social Connections: Socially Isolated (07/07/2024)   Social  Connection and Isolation Panel    Frequency of Communication with Friends and Family: More than three times a week    Frequency of Social Gatherings with Friends and Family: Three times a week    Attends Religious Services: Never    Active Member of Clubs or Organizations: No    Attends Banker Meetings: Not on file    Marital Status: Widowed  Catering manager Violence: Not on file    Physical Exam: Vital signs in last 24 hours: @BP  134/83   Pulse 70   Temp (!) 97 F (36.1 C)   Resp (!) 7   Ht 5' 6 (1.676 m)   Wt 206 lb (93.4 kg)   LMP 09/23/2011   SpO2 99%   BMI 33.25 kg/m  GEN: NAD EYE: Sclerae anicteric ENT: MMM CV: Non-tachycardic Pulm: CTA b/l GI: Soft, NT/ND NEURO:  Alert & Oriented x 3   Gordy Starch, MD [facility]  08/08/2024 1:23 PM

## 2024-08-08 NOTE — Progress Notes (Signed)
 Called to room to assist during endoscopic procedure.  Patient ID and intended procedure confirmed with present staff. Received instructions for my participation in the procedure from the performing physician.

## 2024-08-08 NOTE — Patient Instructions (Signed)
 Resume previous diet. Continue present medications. No aspirin, ibuprofen, naproxen, or other non-steroidal anti-inflammatory drugs for 2 weeks after polyp removal. Awaiting pathology results. Repeat colonoscopy is recommended for surveillance. The colonoscopy date will be determined after pathology results.  Handouts provided on polyps, diverticulosis, and hemorrhoids.   YOU HAD AN ENDOSCOPIC PROCEDURE TODAY AT THE [facility]:   Refer to the procedure report that was given to you for any specific questions about what was found during the examination.  If the procedure report does not answer your questions, please call your gastroenterologist to clarify.  If you requested that your care partner not be given the details of your procedure findings, then the procedure report has been included in a sealed envelope for you to review at your convenience later.  YOU SHOULD EXPECT: Some feelings of bloating in the abdomen. Passage of more gas than usual.  Walking can help get rid of the air that was put into your GI tract during the procedure and reduce the bloating. If you had a lower endoscopy (such as a colonoscopy or flexible sigmoidoscopy) you may notice spotting of blood in your stool or on the toilet paper. If you underwent a bowel prep for your procedure, you may not have a normal bowel movement for a few days.  Please Note:  You might notice some irritation and congestion in your nose or some drainage.  This is from the oxygen used during your procedure.  There is no need for concern and it should clear up in a day or so.  SYMPTOMS TO REPORT IMMEDIATELY:  Following lower endoscopy (colonoscopy or flexible sigmoidoscopy):  Excessive amounts of blood in the stool  Significant tenderness or worsening of abdominal pains  Swelling of the abdomen that is new, acut  Fever of 100F or higher   For urgent or emergent issues, a gastroenterologist can be reached at any hour by calling [phone]  [phone]. Do not use MyChart messaging for urgent concerns.    DIET:  We do recommend a small meal at first, but then you may proceed to your regular diet.  Drink plenty of fluids but you should avoid alcoholic beverages for 24 hours.  ACTIVITY:  You should plan to take it easy for the rest of today and you should NOT DRIVE or use heavy machinery until tomorrow (because of the sedation medicines used during the test).    FOLLOW UP: Our staff will call the number listed on your records the next business day following your procedure.  We will call around 7:15- 8:00 am to check on you and address any questions or concerns that you may have regarding the information given to you following your procedure. If we do not reach you, we will leave a message.     If any biopsies were taken you will be contacted by phone or by letter within the next 1-3 weeks.  Please call us  at [phone] if you have not heard about the biopsies in 3 weeks.    SIGNATURES/CONFIDENTIALITY: You and/or your care partner have signed paperwork which will be entered into your electronic medical record.  These signatures attest to the fact that that the information above on your After Visit Summary has been reviewed and is understood.  Full responsibility of the confidentiality of this discharge information lies with you and/or your care-partner.

## 2024-08-08 NOTE — Op Note (Signed)
  [facility] Patient Name: Kathy Huerta Procedure Date: 08/08/2024 12:47 PM MRN: [id number] Endoscopist: Gordy CHRISTELLA Starch , MD, [id number] Age: 63 Referring MD:  Date of Birth: 06/18/1961 Gender: Female Account #: [id number] Procedure:                Colonoscopy Indications:              High risk colon cancer surveillance: Personal                            history of multiple adenomas, Last colonoscopy:                            November 2020 (TA x 4, SSP x 1) Medicines:                Monitored Anesthesia Care Procedure:                Pre-Anesthesia Assessment:                           - Prior to the procedure, a History and Physical                            was performed, and patient medications and                            allergies were reviewed. The patient's tolerance of                            previous anesthesia was also reviewed. The risks                            and benefits of the procedure and the sedation                            options and risks were discussed with the patient.                            All questions were answered, and informed consent                            was obtained. Prior Anticoagulants: The patient has                            taken no anticoagulant or antiplatelet agents. ASA                            Grade Assessment: II - A patient with mild systemic                            disease. After reviewing the risks and benefits,                            the patient was deemed in satisfactory condition to  undergo the procedure.                           After obtaining informed consent, the colonoscope                            was passed under direct vision. Throughout the                            procedure, the patient's blood pressure, pulse, and                            oxygen saturations were monitored continuously. The                            CF [id number] #[id number] was introduced  through the anus                            and advanced to the cecum, identified by                            appendiceal orifice and ileocecal valve. The                            colonoscopy was performed without difficulty. The                            patient tolerated the procedure well. The quality                            of the bowel preparation was good. The ileocecal                            valve, appendiceal orifice, and rectum were                            photographed. Scope In: 1:26:51 PM Scope Out: 1:51:17 PM Scope Withdrawal Time: 0 hours 20 minutes 31 seconds  Total Procedure Duration: 0 hours 24 minutes 26 seconds  Findings:                 The digital rectal exam was normal.                           A 4 mm polyp was found in the ascending colon. The                            polyp was sessile. The polyp was removed with a                            cold snare. Resection and retrieval were complete.                           A 3 mm polyp was found in the transverse colon. The  polyp was sessile. The polyp was removed with a                            cold snare. Resection and retrieval were complete.                           A 5 mm polyp was found in the descending colon. The                            polyp was sessile. The polyp was removed with a                            cold snare. Resection and retrieval were complete.                           A 12 mm polyp was found in the sigmoid colon. The                            polyp was semi-pedunculated. The polyp was removed                            with a hot snare. Resection and retrieval were                            complete.                           A few small-mouthed diverticula were found in the                            sigmoid colon.                           Internal hemorrhoids were found during                            retroflexion. The hemorrhoids were  small. Complications:            No immediate complications. Estimated Blood Loss:     Estimated blood loss: none. Impression:               - One 4 mm polyp in the ascending colon, removed                            with a cold snare. Resected and retrieved.                           - One 3 mm polyp in the transverse colon, removed                            with a cold snare. Resected and retrieved.                           - One 5 mm polyp in the descending  colon, removed                            with a cold snare. Resected and retrieved.                           - One 12 mm polyp in the sigmoid colon, removed                            with a hot snare. Resected and retrieved.                           - Mild diverticulosis in the sigmoid colon.                           - Internal hemorrhoids. Recommendation:           - Patient has a contact number available for                            emergencies. The signs and symptoms of potential                            delayed complications were discussed with the                            patient. Return to normal activities tomorrow.                            Written discharge instructions were provided to the                            patient.                           - Resume previous diet.                           - Continue present medications.                           - No aspirin, ibuprofen, naproxen, or other                            non-steroidal anti-inflammatory drugs for 2 weeks                            after polyp removal.                           - Await pathology results.                           - Repeat colonoscopy is recommended for                            surveillance. The colonoscopy date will be  determined after pathology results from today's                            exam become available for review. Gordy CHRISTELLA Starch, MD 08/08/2024 1:56:53 PM This report has been signed  electronically.

## 2024-08-09 ENCOUNTER — Telehealth: Payer: Self-pay

## 2024-08-09 NOTE — Telephone Encounter (Signed)
  Follow up Call-     08/08/2024   12:16 PM  Call back number  Post procedure Call Back phone  # [phone]  Permission to leave phone message Yes     Patient questions:  Do you have a fever, pain , or abdominal swelling? No. Pain Score  0 *  Have you tolerated food without any problems? No.  Have you been able to return to your normal activities? No.  Do you have any questions about your discharge instructions: Diet   No. Medications  No. Follow up visit  No.  Do you have questions or concerns about your Care? No.  Actions: * If pain score is 4 or above: No action needed, pain <4.

## 2024-08-10 ENCOUNTER — Ambulatory Visit (INDEPENDENT_AMBULATORY_CARE_PROVIDER_SITE_OTHER): Admitting: Behavioral Health

## 2024-08-10 DIAGNOSIS — F4381 Prolonged grief disorder: Secondary | ICD-10-CM

## 2024-08-10 DIAGNOSIS — F321 Major depressive disorder, single episode, moderate: Secondary | ICD-10-CM

## 2024-08-10 DIAGNOSIS — F4329 Adjustment disorder with other symptoms: Secondary | ICD-10-CM

## 2024-08-10 NOTE — Progress Notes (Addendum)
 [facility] Counselor/Therapist Progress Note  Patient ID: Kathy Huerta, MRN: [id number],    Date: 08/10/2024  Time Spent: 50 min Caregility video: Pt is home in private & Provider working remotely from [employer]. Pt is aware of the risks/limitations of telehealth & consents to Tx today. Time In: 2:00pm Time Out: 2:50pm   Treatment Type: Individual Therapy  Reported Symptoms: Elevated anx/dep & stress due to absence of Husb since his death & her Nephew's mental health issues  Mental Status Exam: Appearance:  Casual     Behavior: Appropriate and Sharing  Motor: Normal  Speech/Language:  Clear and Coherent  Affect: Appropriate  Mood: normal  Thought process: normal  Thought content:   WNL  Sensory/Perceptual disturbances:   WNL  Orientation: oriented to person, place, time/date, and situation  Attention: Good  Concentration: Good  Memory: WNL  Fund of knowledge:  Good  Insight:   Good  Judgment:  Good  Impulse Control: Good   Risk Assessment: Danger to Self:  No Self-injurious Behavior: No Danger to Others: No Duty to Warn:no Physical Aggression / Violence:No  Access to Firearms a concern: No  Gang Involvement:No   Subjective: Pt has found success w/the card game playing w/her Mother. This has been a total reward for her & Mom.   Her recent Colonoscopy this week caused lots of stomach upset.    Interventions: Grief Therapy and Interpersonal  Diagnosis:Current moderate episode of major depressive disorder without prior episode (HCC)  Prolonged grief reaction  Plan: Mahum will try to convey to her Terresa Anes her love/belief in her Nephew's situation. She will make it a point to say this again so he & Wife Channing understand her commitment. Pt is working w/her Mother daily to keep her life happy & joyful.  Target Date: 09/05/2024  Progress: 7  Frequency: Once every 2-3 wks   Modality: [name], LMFT

## 2024-08-10 NOTE — Progress Notes (Signed)
 [facility] Counselor/Therapist Progress Note  Patient ID: Kathy Huerta, MRN: [id number],    Date: 06/03/2024  Time Spent: 50 min Caregility video; Pt is home in private & Provider working remotely from [employer]. Pt is aware of the limitations/risks of telehealth & consents to Tx today.  Time In: 1:00pm Time Out: 1:50pm   Treatment Type: Individual Therapy  Reported Symptoms: Elevated anx/dep & prolonged grief rxn in adjustment to Husb's death  Mental Status Exam: Appearance:  Casual  Behavior: Appropriate and Sharing  Motor: Normal  Speech/Language:  Clear and Coherent  Affect: Appropriate and Tearful  Mood: anxious  Thought process: normal  Thought content:   WNL  Sensory/Perceptual disturbances:   WNL  Orientation: oriented to person, place, time/date, and situation  Attention: Good  Concentration: Good  Memory: WNL  Fund of knowledge:  Good  Insight:   Good  Judgment:  Good  Impulse Control: Good   Risk Assessment: Danger to Self:  No Self-injurious Behavior: No Danger to Others: No Duty to Warn:no Physical Aggression / Violence:No  Access to Firearms a concern: No  Gang Involvement:No   Subjective: Pt's Mother has been placed in [employer] for her daily care. Pt is happy where she is located. She worries for her freq falls. She has stitches on her forehead currently, but doing better since the fall 3 wks ago.   Pt is becoming more engaged w/her Family. They are visiting more & she is enjoying this.   Pt expresses her love of music & how many things she hears right now remind her of the past & her relationship w/her Husb. Her Husb's Family check on her routinely, but they were never very close.   Pt is gaining a sense of safety being alone in the home. It has taken her a great deal to move past some fears & take on chores that involve her being out of her comfort zone, but she is trying.    Interventions: Grief Therapy,  Psycho-education/Bibliotherapy, and Insight-Oriented  Diagnosis:Current moderate episode of major depressive disorder without prior episode (HCC)  Prolonged grief reaction  Plan: Nadie will give herself some overdue pats on the back for all the adjustments she has & cont's to make moving forward. She is trying to narrate life as an adventure & not drudgery. She is finding joy in some new things & this makes her notice her attitude is changing. She will record these things in her Notebook to share during our sessions.   Target Date: 07/05/2024   Progress: 7  Frequency: Once every 2-3 wks  Modality: [name], LMFT

## 2024-08-11 ENCOUNTER — Ambulatory Visit: Payer: Self-pay | Admitting: Internal Medicine

## 2024-08-11 LAB — SURGICAL PATHOLOGY

## 2024-08-23 ENCOUNTER — Ambulatory Visit: Admitting: Behavioral Health

## 2024-08-29 NOTE — Assessment & Plan Note (Signed)
 Supplement and monitor

## 2024-08-29 NOTE — Assessment & Plan Note (Signed)
 Doing well on current meds.

## 2024-08-29 NOTE — Assessment & Plan Note (Signed)
 Encourage heart healthy diet such as MIND or DASH diet, increase exercise, avoid trans fats, simple carbohydrates and processed foods, consider a krill or fish or flaxseed oil cap daily.

## 2024-08-29 NOTE — Assessment & Plan Note (Signed)
 Doing well on Benefiber

## 2024-08-29 NOTE — Assessment & Plan Note (Signed)
 Well controlled, no changes to meds. Encouraged heart healthy diet such as the DASH diet and exercise as tolerated.

## 2024-08-29 NOTE — Assessment & Plan Note (Signed)
 [id number] acceptable, minimize simple carbs. Increase exercise as tolerated.

## 2024-08-29 NOTE — Assessment & Plan Note (Signed)
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 Continues to struggle with debility due to pain and decreased ability to move

## 2024-08-29 NOTE — Assessment & Plan Note (Signed)
 On Levothyroxine, continue to monitor

## 2024-08-30 ENCOUNTER — Other Ambulatory Visit: Payer: Self-pay | Admitting: Family Medicine

## 2024-08-31 ENCOUNTER — Ambulatory Visit (INDEPENDENT_AMBULATORY_CARE_PROVIDER_SITE_OTHER): Admitting: Behavioral Health

## 2024-08-31 DIAGNOSIS — F321 Major depressive disorder, single episode, moderate: Secondary | ICD-10-CM

## 2024-08-31 DIAGNOSIS — F4329 Adjustment disorder with other symptoms: Secondary | ICD-10-CM

## 2024-08-31 DIAGNOSIS — F4381 Prolonged grief disorder: Secondary | ICD-10-CM

## 2024-08-31 DIAGNOSIS — Z636 Dependent relative needing care at home: Secondary | ICD-10-CM | POA: Diagnosis not present

## 2024-08-31 NOTE — Progress Notes (Addendum)
 [facility] Counselor/Therapist Progress Note  Patient ID: Kathy Huerta, MRN: [id number],    Date: 08/31/2024  Time Spent: 50 min Caregility video; Pt is home in private & Provider working remotely from [employer]. Pt is aware of the risks/limitations of telehealth & consents to Tx today.  Time In: 1:00pm Time Out: 1:50pm   Treatment Type: Individual Therapy  Reported Symptoms: Reduction in anx/dep & stress due to her Mother's move into Skilled Nsg, her heart rate has stabilized.  Mental Status Exam: Appearance:  Casual     Behavior: Appropriate and Sharing  Motor: Normal  Speech/Language:  Clear and Coherent  Affect: Appropriate  Mood: normal  Thought process: normal  Thought content:   WNL  Sensory/Perceptual disturbances:   WNL  Orientation: oriented to person, place, time/date, and situation  Attention: Good  Concentration: Good  Memory: WNL  Fund of knowledge:  Good  Insight:   Good  Judgment:  Good  Impulse Control: Good   Risk Assessment: Danger to Self:  No Self-injurious Behavior: No Danger to Others: No Duty to Warn:no Physical Aggression / Violence:No  Access to Firearms a concern: No  Gang Involvement:No   Subjective: Pt is  doing better since the d/c' of her medication causing complications. Most Sx have remitted; her mood flares, her sleep issues & her cause for general mental health concerns. She has exp'd episodes of crying & we discussed this. She is handling a lot of stress from being a Caregiver for her 62yo Mother due to her close location w/her Mother. Siblings are starting to step in more @ her insistence.   Interventions: Family Systems  Diagnosis:Current moderate episode of major depressive disorder without prior episode (HCC)  Prolonged grief reaction  Caregiver burden  Plan: Kathy Huerta is doing better today; she is caring more for herself & being assertive w/her Siblings. Most of her Sx of anx/dep have remitted & we discussed the  stress of being a Caregiver, even in the setting of a Facility & the utiility that holds for Family members. Kathy Huerta will cont to give herself grace, understanding, & the awareness of this new role so she can understand what her & her Mother hare been through in the past few yrs. She has needed to remind her Mother about the decisions that were made by hr Parents in their elderly yrs. [name] feels validation & normalization in her new role.  Target Date:09/21/2024  Progress:6  Frequency: Once every 2-3 wks  Modality: [name], LMFT

## 2024-09-01 ENCOUNTER — Encounter: Payer: Self-pay | Admitting: Family Medicine

## 2024-09-01 ENCOUNTER — Telehealth: Admitting: Family Medicine

## 2024-09-01 DIAGNOSIS — F321 Major depressive disorder, single episode, moderate: Secondary | ICD-10-CM | POA: Diagnosis not present

## 2024-09-01 DIAGNOSIS — E782 Mixed hyperlipidemia: Secondary | ICD-10-CM

## 2024-09-01 DIAGNOSIS — I10 Essential (primary) hypertension: Secondary | ICD-10-CM

## 2024-09-01 DIAGNOSIS — R739 Hyperglycemia, unspecified: Secondary | ICD-10-CM

## 2024-09-01 DIAGNOSIS — M5135 Other intervertebral disc degeneration, thoracolumbar region: Secondary | ICD-10-CM | POA: Diagnosis not present

## 2024-09-01 DIAGNOSIS — K5909 Other constipation: Secondary | ICD-10-CM | POA: Diagnosis not present

## 2024-09-01 DIAGNOSIS — E039 Hypothyroidism, unspecified: Secondary | ICD-10-CM

## 2024-09-01 DIAGNOSIS — E559 Vitamin D deficiency, unspecified: Secondary | ICD-10-CM

## 2024-09-01 MED ORDER — VENLAFAXINE HCL ER 37.5 MG PO CP24: 37.5000 mg | ORAL_CAPSULE | Freq: Every day | ORAL | 1 refills | Status: DC

## 2024-09-01 NOTE — Patient Instructions (Addendum)
 CBTi (insomnia) APP from the [facility]  Astelin nasal spray to drop risk of picking up COVID at your mother's facility

## 2024-09-01 NOTE — Progress Notes (Signed)
 MyChart Video Visit    Virtual Visit via Video Note   This patient is at least at moderate risk for complications without adequate follow up. This format is felt to be most appropriate for this patient at this time. Physical exam was limited by quality of the video and audio technology used for the visit. Cherise was able to get the patient set up on a video visit.  Patient location: home Patient and provider in visit Provider location: Office  I discussed the limitations of evaluation and management by telemedicine and the availability of in person appointments. The patient expressed understanding and agreed to proceed.  Visit Date: 09/01/2024  Today's healthcare provider: Harlene Horton, MD     Subjective:    Patient ID: Kathy Huerta, female    DOB: 1961/02/26, 63 y.o.   MRN: [id number]  Chief Complaint  Patient presents with   Medical Management of Chronic Issues    Patient presents today for a month follow-up.   Quality Metric Gaps    Pneumococcal vaccine, HIV screening    HPI Discussed the use of AI scribe software for clinical note transcription with the patient, who gave verbal consent to proceed.  History of Present Illness Kathy Huerta is a 63 year old female with depression who presents with worsening symptoms and medication adjustment.  She experiences increased emotional distress, characterized by daily crying and disrupted sleep patterns. Sleep is only adequate with Ambien , but it causes grogginess the following day. She is currently taking venlafaxine  (Effexor ) and believes a dosage adjustment is necessary due to her current symptoms. She also takes Ambien  for sleep but is concerned about its side effects.  She feels fatigued and attributes some of this to the stress of caring for her elderly mother, who is difficult to manage due to her refusal to use a wheelchair. Her brother assisted her recently, which was helpful. She is inconsistent with taking her vitamin D   supplements and finds it challenging to remember to take them due to the number of medications she takes in the morning. She takes multiple medications in the morning and finds it challenging to swallow them all at once, often leaving the vitamin D  for later and forgetting it.  She expresses concern about COVID-19 exposure, especially when visiting her mother in a facility where there have been cases. She is interested in receiving a COVID vaccine. She has had COVID-19 multiple times and is proactive about vaccinations.  She has a history of undergoing colonoscopies every three years due to previous findings, although she does not specify what those findings were. She recently had a colonoscopy and is not pleased about the frequency but understands its necessity.  No heartburn or chest pain. Reports fatigue and disrupted sleep patterns.    Past Medical History:  Diagnosis Date   Allergy 2010   Anxiety    Arthritis of right hip 10/28/2016   Basal cell carcinoma 11/17/2011   Dr. Veryl   Cataract 2010   Colon polyps    Cutaneous skin tags 07/24/2013   DDD (degenerative disc disease), cervical    s/p fusion   DDD (degenerative disc disease), thoracolumbar    on disability   Depression    Headache(784.0) 01/29/2013   Hearing loss    Saw ENT on 04/27/2017; Moderate hearing loss in both ears.   Heartburn    Hiatal hernia    HTN (hypertension) 2002   Hyperlipemia 08/2014   Hyperplastic colon polyp    Hypothyroid  Internal hemorrhoids    Obesity 04/30/2017   Pain in joint, shoulder region 11/20/2013   Pancreatitis 04/24/2011   Pneumonia 07/24/2013   Preventative health care 10/28/2016   Preventative health care 10/28/2016   Tachycardia 08/12/2014   Vitamin D  deficiency 11/18/2015    Past Surgical History:  Procedure Laterality Date   ANTERIOR FUSION CERVICAL SPINE  12/22/2006   C6-7  Dr Onetha   CHOLECYSTECTOMY N/A 02/27/2013   Procedure: LAPAROSCOPIC CHOLECYSTECTOMY WITH  INTRAOPERATIVE CHOLANGIOGRAM;  Surgeon: Elspeth KYM Schultze, MD;  Location: [facility];  Service: General;  Laterality: N/A;   POSTERIOR FUSION CERVICAL SPINE  12/22/2009   C6-7   SPINE SURGERY  2009    Family History  Problem Relation Age of Onset   Hypertension Mother    Hyperlipidemia Mother    Arthritis Mother    Hearing loss Mother    Other Father        DDD, colectomy,cardiac stent, afib   Colon polyps Father    Anxiety disorder Father    COPD Father        died due to complications from Covid-19   Depression Father    Diabetes Father    Heart disease Father    Hypertension Brother    Diabetes Maternal Grandmother    Diabetes Paternal Grandmother    Colon cancer Neg Hx    Stomach cancer Neg Hx    Kidney disease Neg Hx     Social History   Socioeconomic History   Marital status: Widowed    Spouse name: Not on file   Number of children: 0   Years of education: Not on file   Highest education level: Bachelor's degree (e.g., BA, AB, BS)  Occupational History   Occupation: Disabled    Employer: UNEMPLOYED  Tobacco Use   Smoking status: Never   Smokeless tobacco: Never  Vaping Use   Vaping status: Never Used  Substance and Sexual Activity   Alcohol use: Yes    Alcohol/week: 5.0 standard drinks of alcohol    Types: 5 Cans of beer per week   Drug use: No   Sexual activity: Not Currently  Other Topics Concern   Not on file  Social History Narrative   Reviewed history and no changes required.   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   Regular exercise-no   Smoking Status:  quit   Caffeine use/day:  none   Does Patient Exercise:  no         Social Drivers of Corporate investment banker Strain: Low Risk  (07/07/2024)   Overall Financial Resource Strain (CARDIA)    Difficulty of Paying Living Expenses: Not very hard  Food Insecurity: No Food Insecurity (07/07/2024)    Hunger Vital Sign    Worried About Running Out of Food in the Last Year: Never true    Ran Out of Food in the Last Year: Never true  Transportation Needs: No Transportation Needs (07/07/2024)   PRAPARE - Administrator, Civil Service (Medical): No    Lack of Transportation (Non-Medical): No  Physical Activity: Inactive (07/07/2024)   Exercise Vital Sign    Days of Exercise per Week: 0 days    Minutes of Exercise per Session: Not on file  Stress: Stress Concern Present (07/07/2024)   [employer] of Occupational Health - Occupational Stress Questionnaire    Feeling of Stress:  Rather much  Social Connections: Socially Isolated (07/07/2024)   Social Connection and Isolation Panel    Frequency of Communication with Friends and Family: More than three times a week    Frequency of Social Gatherings with Friends and Family: Three times a week    Attends Religious Services: Never    Active Member of Clubs or Organizations: No    Attends Banker Meetings: Not on file    Marital Status: Widowed  Catering manager Violence: Not on file    Outpatient Medications Prior to Visit  Medication Sig Dispense Refill   atorvastatin  (LIPITOR) 40 MG tablet TAKE 1 TABLET BY MOUTH DAILY 90 tablet 1   diclofenac  Sodium (VOLTAREN ) 1 % GEL Apply 2 g topically as needed.     diphenoxylate -atropine  (LOMOTIL ) 2.5-0.025 MG tablet TAKE ONE TO TWO TABLETS BY MOUTH FOUR TIMES DAILY AS NEEDED FOR DIARRHEA OR LOOSE STOOLS MAX OF 8 TABLETS PER 24 HOURS AS DIRECTED 30 tablet 1   hyoscyamine  (LEVSIN  SL) 0.125 MG SL tablet Take 1-2 tablets under tongue every 6 hours as needed for IBS 90 tablet 1   levothyroxine  (SYNTHROID ) 50 MCG tablet TAKE 1 TABLET BY MOUTH DAILY BEFORE BREAKFAST 30 tablet 1   losartan  (COZAAR ) 25 MG tablet TAKE 1 TABLET BY MOUTH DAILY 90 tablet 1   pantoprazole  (PROTONIX ) 40 MG tablet Take 1 tablet (40 mg total) by mouth daily. 90 tablet 3   propranolol  (INDERAL ) 40 MG tablet  TAKE 1 TABLET BY MOUTH 2 TIMES A DAY 180 tablet 1   sertraline  (ZOLOFT ) 100 MG tablet Take 1 tablet (100 mg total) by mouth every morning.     venlafaxine  XR (EFFEXOR -XR) 75 MG 24 hr capsule TAKE 1 CAPSULE BY MOUTH DAILY WITH BREAKFAST 30 capsule 3   zolpidem  (AMBIEN ) 10 MG tablet Take 1 tablet (10 mg total) by mouth at bedtime as needed. for sleep 30 tablet 2   No facility-administered medications prior to visit.    Allergies  Allergen Reactions   Amoxicillin-Pot Clavulanate     REACTION: Throat swelling.   Azithromycin     REACTION: Throat Swelling   Cefazolin     REACTION: Throat Swelling   Moxifloxacin     REACTION: Throat Swelling   Penicillins     REACTION: throat swelling   Sulfonamide Derivatives     Review of Systems  Constitutional:  Positive for malaise/fatigue. Negative for fever.  HENT:  Negative for congestion.   Eyes:  Negative for blurred vision.  Respiratory:  Negative for shortness of breath.   Cardiovascular:  Negative for chest pain, palpitations and leg swelling.  Gastrointestinal:  Negative for abdominal pain, blood in stool and nausea.  Genitourinary:  Negative for dysuria and frequency.  Musculoskeletal:  Negative for falls.  Skin:  Negative for rash.  Neurological:  Negative for dizziness, loss of consciousness and headaches.  Endo/Heme/Allergies:  Negative for environmental allergies.  Psychiatric/Behavioral:  Positive for depression. The patient is nervous/anxious.        Objective:    Physical Exam Constitutional:      General: She is not in acute distress.    Appearance: Normal appearance. She is not ill-appearing or toxic-appearing.  HENT:     Head: Normocephalic and atraumatic.     Right Ear: External ear normal.     Left Ear: External ear normal.     Nose: Nose normal.  Eyes:     General:        Right eye: No discharge.  Left eye: No discharge.  Pulmonary:     Effort: Pulmonary effort is normal.  Skin:    Findings: No  rash.  Neurological:     Mental Status: She is alert and oriented to person, place, and time.  Psychiatric:        Behavior: Behavior normal.     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 Continues to struggle with debility due to pain and decreased ability to move    Constipation, chronic Assessment & Plan: Doing well on Benefiber   Current moderate episode of major depressive disorder without prior episode [facility]) Assessment & Plan: Doing well on current meds   Hyperglycemia Assessment & Plan: hgba1c acceptable, minimize simple carbs. Increase exercise as tolerated.    Mixed hyperlipidemia Assessment & Plan: Encourage heart healthy diet such as MIND or DASH diet, increase exercise, avoid trans fats, simple carbohydrates and processed foods, consider a krill or fish or flaxseed oil cap daily.    Primary hypertension Assessment & Plan: Well controlled, no changes to meds. Encouraged heart healthy diet such as the DASH diet and exercise as tolerated.    Hypothyroidism, unspecified type Assessment & Plan: On Levothyroxine , continue to monitor   Vitamin D  deficiency Assessment & Plan: Supplement and monitor    Other orders -     Venlafaxine  HCl ER; Take 1 capsule (37.5 mg total) by mouth daily with breakfast. With a 75 mg capsule for a total of 112.5 mg daily  Dispense: 90 capsule; Refill: 1     Assessment and Plan Assessment & Plan Depression and Insomnia Increased crying and disrupted sleep patterns. Current treatment includes venlafaxine  and zolpidem . Venlafaxine  dose adjustment is considered due to suboptimal control of  depressive symptoms. Insomnia management includes cognitive behavioral therapy (CBT) for insomnia, as long-term medication use is not ideal. Discussed stepwise increase of venlafaxine  to minimize risk of drug interactions with sertraline . - Increase venlafaxine  to 112.5 mg daily by adding 37.5 mg to the current 75 mg dose. - Consider further increase to 150 mg if symptoms do not improve. - Download and use the CBT for insomnia app developed by the [place]. - Continue zolpidem  as needed for sleep.  Vitamin D  deficiency Reports inconsistent adherence to vitamin D  supplementation. Fatigue may be related to vitamin D  deficiency, although not strongly supported by literature. - Improve adherence to vitamin D  supplementation. - Consider taking vitamin D  with food to aid swallowing.  COVID-19 Prevention Interested in receiving the COVID vaccine due to exposure risk from visiting her mother in a facility with known COVID cases. Discussed the use of Astelin nasal spray as a preventive measure against COVID infection. Informed that some insurances may not cover COVID treatment pills and that their efficacy has decreased due to viral mutations. - Send prescription for COVID vaccine to pharmacy next week. - Use Astelin nasal spray before visiting high-risk areas.  Recording duration: 28 minutes     I discussed the assessment and treatment plan with the patient. The patient was provided an opportunity to ask questions and all were answered. The patient agreed with the plan and  demonstrated an understanding of the instructions.   The patient was advised to call back or seek an in-person evaluation if the symptoms worsen or if the condition fails to improve as anticipated.  Harlene Horton, MD [facility] at [facility] [phone] (phone) [phone] (fax)  [facility]

## 2024-09-02 ENCOUNTER — Other Ambulatory Visit: Payer: Self-pay

## 2024-09-05 ENCOUNTER — Encounter: Payer: Self-pay | Admitting: Family Medicine

## 2024-09-05 ENCOUNTER — Other Ambulatory Visit: Payer: Self-pay | Admitting: Family Medicine

## 2024-09-06 ENCOUNTER — Ambulatory Visit: Admitting: Behavioral Health

## 2024-09-12 ENCOUNTER — Ambulatory Visit (INDEPENDENT_AMBULATORY_CARE_PROVIDER_SITE_OTHER): Admitting: Behavioral Health

## 2024-09-12 DIAGNOSIS — F4381 Prolonged grief disorder: Secondary | ICD-10-CM | POA: Diagnosis not present

## 2024-09-12 DIAGNOSIS — F4329 Adjustment disorder with other symptoms: Secondary | ICD-10-CM

## 2024-09-12 DIAGNOSIS — F321 Major depressive disorder, single episode, moderate: Secondary | ICD-10-CM

## 2024-09-12 NOTE — Progress Notes (Signed)
 [facility] Counselor/Therapist Progress Note  Patient ID: Kathy Huerta, MRN: [id number],    Date: 09/12/2024  Time Spent: 50 min Caregility video; Pt is @ home in private & Provider working remotely from [employer]. Pt is aware of the risks/limitations of telehealth & consents to Tx today.  Time In: 2:00pm Time Out: 2:50pm   Treatment Type: Individual Therapy  Reported Symptoms: Some reduction in anx/dep & stress  Mental Status Exam: Appearance:  Casual     Behavior: Appropriate and Sharing  Motor: Normal  Speech/Language:  Clear and Coherent  Affect: Appropriate  Mood: normal  Thought process: normal  Thought content:   WNL  Sensory/Perceptual disturbances:   WNL  Orientation: oriented to person, place, time/date, and situation  Attention: Good  Concentration: Good  Memory: WNL  Fund of knowledge:  Good  Insight:   Good  Judgment:  Good  Impulse Control: Good   Risk Assessment: Danger to Self:  No Self-injurious Behavior: No Danger to Others: No Duty to Warn:no Physical Aggression / Violence:No  Access to Firearms a concern: No  Gang Involvement:No   Subjective: Pt is expressing her grief over the death of her Father  Kathy Huerta today. She recounts how special he was & calling to speak w/him @ different times. Her Fr was an Airline pilot & was very smart. She misses his mannerisms, his presence, his dependability & his stature was intimidating to others. His other Dx was CHF. Once in [facility], smoking was prohibitive. Her Parents moved to this Facility over 15 yrs ago. Kathy was 63yo when he died.   Her Father had COPD due to smoking. This habit started @ 64yo. Once he hit his 71's, his health status began to change drastically.   Interventions: Insight-Oriented and Interpersonal; normalization of her bereavement  Diagnosis:Current moderate episode of major depressive disorder without prior episode (HCC)  Prolonged grief reaction  Plan:  Kathy Huerta will cont to remember her relatives she misses. She is remembering them & current relatives who are sick. She dislikes Funerals & does not want to attend them due to anxiety. Once her Husb died, she held in her emotions moving forward. Kathy Huerta was very stoic. This method of dealing w/emotions has taken a toll. She will think about what we discussed today regarding her mourning. Her crying has reduced in freq & she feels this is healthier for her. We also discussed the full range. Kathy Huerta will explore the Emotion Wheels online & see the descriptions that make sense to her.  Target Date: 10/05/2024 Progress: 7 Frequency: Once every 2-3 wks Modality: [name], LMFT

## 2024-09-14 ENCOUNTER — Other Ambulatory Visit (HOSPITAL_BASED_OUTPATIENT_CLINIC_OR_DEPARTMENT_OTHER): Payer: Self-pay

## 2024-09-14 MED ORDER — FLUZONE 0.5 ML IM SUSY
0.5000 mL | PREFILLED_SYRINGE | Freq: Once | INTRAMUSCULAR | 0 refills | Status: AC
  Filled 2024-09-14: qty 0.5, 1d supply, fill #0

## 2024-09-14 MED ORDER — COMIRNATY 30 MCG/0.3ML IM SUSY
0.3000 mL | PREFILLED_SYRINGE | Freq: Once | INTRAMUSCULAR | 0 refills | Status: AC
  Filled 2024-09-14: qty 0.3, 1d supply, fill #0

## 2024-09-28 ENCOUNTER — Ambulatory Visit: Admitting: Behavioral Health

## 2024-09-28 DIAGNOSIS — F321 Major depressive disorder, single episode, moderate: Secondary | ICD-10-CM

## 2024-09-28 DIAGNOSIS — F4381 Prolonged grief disorder: Secondary | ICD-10-CM | POA: Diagnosis not present

## 2024-09-28 DIAGNOSIS — Z636 Dependent relative needing care at home: Secondary | ICD-10-CM

## 2024-09-28 DIAGNOSIS — F4329 Adjustment disorder with other symptoms: Secondary | ICD-10-CM

## 2024-09-28 DIAGNOSIS — F4320 Adjustment disorder, unspecified: Secondary | ICD-10-CM

## 2024-09-28 NOTE — Progress Notes (Signed)
 [facility] Counselor/Therapist Progress Note  Patient ID: LORIEN SHINGLER, MRN: [id number],    Date: 09/28/2024  Time Spent: 50 min TC; she is home in private; Provider is working remotely from [employer]. Pt is aware of the risks/limitations of telehealth & consents to Tx today.  Time In: 2:00pm Time Out: 2:50pm   Treatment Type: Individual Therapy  Reported Symptoms: Dec in anx/dep & stress due to Family situations  Mental Status Exam: Appearance:  Casual     Behavior: Appropriate and Sharing  Motor: Normal  Speech/Language:  Clear and Coherent  Affect: Appropriate  Mood: normal  Thought process: normal  Thought content:   WNL  Sensory/Perceptual disturbances:   WNL  Orientation: oriented to person, place, time/date, and situation  Attention: Good  Concentration: Good  Memory: WNL  Fund of knowledge:  Good  Insight:   Good  Judgment:  Good  Impulse Control: Good   Risk Assessment: Danger to Self:  No Self-injurious Behavior: No Danger to Others: No Duty to Warn:no Physical Aggression / Violence:No  Access to Firearms a concern: No  Gang Involvement:No   Subjective: Pt is expressing her concern for her Mother's Bros who is her Uncle. He has been hosp'd for 3+ wks w/kidney issues. Mom cannot make it to a pending Funeral & they have discussed this tgthr.  The most recent event @ [facility] has been a Systems analyst where the Patients were catered to in every way.   Her Nephew has had a Son born w/a second thumb which will need corrective surgery when the Ezella is older. Nephew is still expressing SI.   Interventions: Insight-Oriented and Interpersonal  Diagnosis:Current moderate episode of major depressive disorder without prior episode (HCC)  Prolonged grief reaction  Caregiver burden  Grief reaction  Plan: Annlee will consider the impact of the Fall Formal on her Mother & herself.  This event has made a terrific impact on her & her Mother's  relationship. She looks forward to a Spring Formal. She will keep our discussion in mind today re: her Nephew who has been vulnerable to SI in the past 6 mos. She needs some reassurance about his situatio & it is helpful to have her concerns validated.  Target Date: 10/20/2024  Progress: 8  Frequency: Once every 2-3 wks  Modality: [name], LMFT

## 2024-10-03 ENCOUNTER — Other Ambulatory Visit: Payer: Self-pay | Admitting: Family Medicine

## 2024-10-05 ENCOUNTER — Other Ambulatory Visit: Payer: Self-pay | Admitting: Family Medicine

## 2024-10-12 ENCOUNTER — Ambulatory Visit: Admitting: Behavioral Health

## 2024-10-12 DIAGNOSIS — F4320 Adjustment disorder, unspecified: Secondary | ICD-10-CM

## 2024-10-12 DIAGNOSIS — Z636 Dependent relative needing care at home: Secondary | ICD-10-CM | POA: Diagnosis not present

## 2024-10-12 DIAGNOSIS — F321 Major depressive disorder, single episode, moderate: Secondary | ICD-10-CM

## 2024-10-12 DIAGNOSIS — F4381 Prolonged grief disorder: Secondary | ICD-10-CM | POA: Diagnosis not present

## 2024-10-12 DIAGNOSIS — F4329 Adjustment disorder with other symptoms: Secondary | ICD-10-CM

## 2024-10-12 NOTE — Progress Notes (Signed)
 [facility] Counselor/Therapist Progress Note  Patient ID: Kathy Huerta, MRN: [id number],    Date: 10/12/2024  Time Spent: 50 min Caregility video; Pt is @ home in private & Provider working remotely from [employer]. Pt aware of the risks/limitations of telehealth & consents to Tx today.  Time In: 2:00pm Time Out: 2:50pm   Treatment Type: Individual Therapy  Reported Symptoms: Elevated anx/dep & stressed due to Family dynamics  Mental Status Exam: Appearance:  Casual     Behavior: Appropriate and Sharing  Motor: Normal  Speech/Language:  Clear and Coherent  Affect: Appropriate  Mood: normal  Thought process: normal  Thought content:   WNL  Sensory/Perceptual disturbances:   WNL  Orientation: oriented to person, place, time/date, and situation  Attention: Good  Concentration: Good  Memory: WNL  Fund of knowledge:  Good  Insight:   Good  Judgment:  Good  Impulse Control: Good   Risk Assessment: Danger to Self:  No Self-injurious Behavior: No Danger to Others: No Duty to Warn:no Physical Aggression / Violence:No  Access to Firearms a concern: No  Gang Involvement:No   Subjective: Pt is upset over the Family system dynamics that are currently happening. She is concerned for the dynamics of the Newborn that has genetic disorders that are inc'g since recognition of 2 thumbs into spinal & liver issues.    Pt is eager to put her own health first, while realizing she loves & cares for ppl in her life.   Interventions: Family Systems, psychoedu  Diagnosis:Current moderate episode of major depressive disorder without prior episode (HCC)  Prolonged grief reaction  Caregiver burden  Grief reaction  Plan: Kathy Huerta will try to recognize her habit of putting others first. She will use the validation/normalization of her situation as recognized today to assist her to be her own self.   Target Date:11/20/2024  Progress:6  Frequency: Once every 2-3 wks  Modality:  [name], LMFT

## 2024-10-16 ENCOUNTER — Other Ambulatory Visit: Payer: Self-pay | Admitting: Family Medicine

## 2024-10-25 ENCOUNTER — Ambulatory Visit: Admitting: Behavioral Health

## 2024-10-25 DIAGNOSIS — Z636 Dependent relative needing care at home: Secondary | ICD-10-CM | POA: Diagnosis not present

## 2024-10-25 DIAGNOSIS — F320 Major depressive disorder, single episode, mild: Secondary | ICD-10-CM | POA: Diagnosis not present

## 2024-10-25 NOTE — Progress Notes (Unsigned)
 [facility] Counselor/Therapist Progress Note  Patient ID: Kathy Huerta, MRN: [id number],    Date: 10/26/2024  Time Spent: 50 min In Person @ [facility] Office   Treatment Type: Individual Therapy  Reported Symptoms: Reduction in anx/dep & stress, remittance of severe levels of grief  Mental Status Exam: Appearance:  Casual     Behavior: Appropriate and Sharing  Motor: Normal  Speech/Language:  Clear and Coherent  Affect: Appropriate  Mood: euthymic/normal  Thought process: normal  Thought content:   WNL  Sensory/Perceptual disturbances:   WNL  Orientation: oriented to person, place, time/date, and situation  Attention: Good  Concentration: Good  Memory: WNL  Fund of knowledge:  Good  Insight:   Good  Judgment:  Good  Impulse Control: Good   Risk Assessment: Danger to Self:  No Self-injurious Behavior: No Danger to Others: No Duty to Warn:no Physical Aggression / Violence:No  Access to Firearms a concern: No  Gang Involvement:No   Subjective: Pt is upbeat today & handling her frustrations w/her [place] residency @ [employer] well. She has good humor about the other Residents & she & Mother are getting along well. She expresses expected frustrations about her Mother's requests & needs-this is a reflection of her Caregiver burden. Promoted Caregiver Support & will make available addt'l resources through further contact.   Interventions: Family Systems, Normalized & Validated Pt Caregiver concerns.  Diagnosis:Current mild episode of major depressive disorder without prior episode  Caregiver burden  Plan: Jeany will explore the options for Caregiver Support through [facility] & also contact [facility] Support Services @ [phone] for the upcoming Grp they are starting on the 2nd & 4th Tue of ea month so she can utilize this new opportunity. Clinician also provided email: [email or web address].  Target Date: 11/20/2024  Progress:  7  Frequency: Once every 2-3 wks  Modality: [name], LMFT

## 2024-10-28 ENCOUNTER — Other Ambulatory Visit: Payer: Self-pay | Admitting: Family Medicine

## 2024-10-28 NOTE — Telephone Encounter (Signed)
 Requesting: Ambien  10mg   Contract:07/11/24 UDS: 07/11/24 Last Visit: 09/01/24 Next Visit: 11/24/24 w/ Harlene GRADE Last Refill: 05/10/24 #30 and 2RF   Please Advise

## 2024-10-29 ENCOUNTER — Other Ambulatory Visit: Payer: Self-pay | Admitting: Family Medicine

## 2024-11-08 ENCOUNTER — Ambulatory Visit: Admitting: Behavioral Health

## 2024-11-09 ENCOUNTER — Ambulatory Visit: Admitting: Behavioral Health

## 2024-11-09 DIAGNOSIS — Z636 Dependent relative needing care at home: Secondary | ICD-10-CM

## 2024-11-09 DIAGNOSIS — F320 Major depressive disorder, single episode, mild: Secondary | ICD-10-CM

## 2024-11-09 NOTE — Progress Notes (Signed)
 [facility] Counselor/Therapist Progress Note  Patient ID: Kathy Huerta, MRN: [id number],    Date: 11/09/2024  Time Spent: 50 min Caregility video; Pt is home in private & Provider working remotely from [employer]. Pt is aware of the risks/limitations of telehealth & consents to Tx today. Time In: 11:00am Time Out: 11:50am  Treatment Type: Individual Therapy  Reported Symptoms: Reduction in anx/dep & stress due to full-time Caregiving  Mental Status Exam: Appearance:  Casual     Behavior: Appropriate and Sharing  Motor: Normal  Speech/Language:  Clear and Coherent  Affect: Appropriate  Mood: normal  Thought process: normal  Thought content:   WNL  Sensory/Perceptual disturbances:   WNL  Orientation: oriented to person, place, time/date, and situation  Attention: Good  Concentration: Good  Memory: WNL  Fund of knowledge:  Good  Insight:   Good  Judgment:  Good  Impulse Control: Good   Risk Assessment: Danger to Self:  No Self-injurious Behavior: No Danger to Others: No Duty to Warn:no Physical Aggression / Violence:No  Access to Firearms a concern: No  Gang Involvement:No   Subjective: Pt is speaking about Thanksgiving today & her obsessive thoughts about her Husb's health status changes around this time. It was a hard year in 2022 trying to accept his health status changes & final days. She is dealing w/the need to be busy during this time of distress. She is using her yarn work & pillowcases to maintain her sense of sanity during the Holiday Season.    Pt spoke to the trauma of the psychosis rxn her Husb had to being in the ICU. It was traumatic for her. She remembers feeling numb. Pt's Husb was in [facility] before transportation to [place].   This is the first year Pt has thought about the Holidays in a meaningful way. She feels like she is getting better & sadness is not so harsh.  Interventions: Insight-Oriented and Family Systems  Diagnosis:Current  mild episode of major depressive disorder without prior episode  Caregiver burden  Plan: Marki will attempt to be kind to herself this season & give herself grace about her feelings, her Husb, & her love for her Mother & Family. She may consider a few new traditions that will help her establish some Holiday traditions that serve her need for ritual & celebration to remember [employer].   Target Date: 12/05/2024  Progress: 6  Frequency: Once every 2-3 wks  Modality: [name], LMFT

## 2024-11-12 ENCOUNTER — Other Ambulatory Visit: Payer: Self-pay | Admitting: Family Medicine

## 2024-11-13 ENCOUNTER — Other Ambulatory Visit: Payer: Self-pay | Admitting: Family Medicine

## 2024-11-14 ENCOUNTER — Telehealth: Payer: Self-pay | Admitting: Family Medicine

## 2024-11-14 DIAGNOSIS — E559 Vitamin D deficiency, unspecified: Secondary | ICD-10-CM

## 2024-11-14 DIAGNOSIS — R739 Hyperglycemia, unspecified: Secondary | ICD-10-CM

## 2024-11-14 DIAGNOSIS — I10 Essential (primary) hypertension: Secondary | ICD-10-CM

## 2024-11-14 DIAGNOSIS — E782 Mixed hyperlipidemia: Secondary | ICD-10-CM

## 2024-11-14 DIAGNOSIS — E039 Hypothyroidism, unspecified: Secondary | ICD-10-CM

## 2024-11-14 NOTE — Telephone Encounter (Signed)
 This patient has order in the encounter as orders only are these for her lab appointment next week

## 2024-11-14 NOTE — Telephone Encounter (Signed)
 Orders placed.

## 2024-11-21 ENCOUNTER — Other Ambulatory Visit

## 2024-11-22 ENCOUNTER — Ambulatory Visit: Admitting: Behavioral Health

## 2024-11-22 DIAGNOSIS — Z636 Dependent relative needing care at home: Secondary | ICD-10-CM

## 2024-11-22 DIAGNOSIS — F320 Major depressive disorder, single episode, mild: Secondary | ICD-10-CM

## 2024-11-22 DIAGNOSIS — F4329 Adjustment disorder with other symptoms: Secondary | ICD-10-CM | POA: Diagnosis not present

## 2024-11-22 NOTE — Progress Notes (Signed)
 [facility] Counselor/Therapist Progress Note  Patient ID: AMORY ZBIKOWSKI, MRN: [id number],    Date: 11/22/2024  Time Spent: 50 min In Person @ [facility] Office Time In: 2:00pm Time Out: 2:50pm   Treatment Type: Individual Therapy  Reported Symptoms: Inc in anx/dep & frustration/stress since the start of the Holiday season. Today is the exact date her Husb was sent to [facility] 3 yrs ago for Tx & he never came back home. He died @ [facility] in 02-Dec-2021.   Mental Status Exam: Appearance:  Casual     Behavior: Appropriate and Sharing  Motor: Normal  Speech/Language:  Clear and Coherent  Affect: Appropriate  Mood: sad  Thought process: normal  Thought content:   WNL  Sensory/Perceptual disturbances:   WNL  Orientation: oriented to person, place, time/date, and situation  Attention: Good  Concentration: Good  Memory: WNL  Fund of knowledge:  Good  Insight:   Good  Judgment:  Good  Impulse Control: Good   Risk Assessment: Danger to Self:  No Self-injurious Behavior: No Danger to Others: No Duty to Warn:no Physical Aggression / Violence:No  Access to Firearms a concern: No  Gang Involvement:No   Subjective: Pt's Family celebration for Thanksgiving included herself, her Terresa Anes & her Mother. They enjoyed eating together & Siblings teased ea other. Pt is still adapting to the loss of her Husb Les 3 yrs ago. She has many responsibilities since his deah & finds these uncomfortable @ times.    Pt decorated her Mother's residence in Asst Living @ [address] She reports this as enjoyable!  Interventions: Family Systems and Interpersonal  Diagnosis:Current mild episode of major depressive disorder without prior episode  Caregiver burden  Prolonged grief reaction  Plan: Tyrisha will keep in mind the uniqueness of her situation & that she also is not alone in grief over her Husb. She misses him terribly this week. A Grief Support Grp may also benefit Pt so she can feel the  empathy of others in similar situations.   Target Date: 12/20/2024  Progress: 7  Frequency: Once every 2-3 wks  Modality: [name], LMFT

## 2024-11-23 NOTE — Assessment & Plan Note (Signed)
 Stable on current medications

## 2024-11-23 NOTE — Assessment & Plan Note (Signed)
 Tolerating statin. Encourage heart healthy diet such as MIND or DASH diet, increase exercise, avoid trans fats, simple carbohydrates and processed foods, consider a krill or fish or flaxseed oil cap daily.

## 2024-11-23 NOTE — Assessment & Plan Note (Signed)
 On levothyroxine .  Continue to monitor.

## 2024-11-23 NOTE — Assessment & Plan Note (Signed)
 Doing well on current medication.  Encouraged good sleep hygiene such as dark, quiet room. No blue/green glowing lights such as computer screens in bedroom. No alcohol or stimulants in evening. Cut down on caffeine as able. Regular exercise is helpful but not just prior to bed time.

## 2024-11-23 NOTE — Progress Notes (Unsigned)
 Subjective:     Patient ID: Kathy Huerta, female    DOB: 12/26/1960, 63 y.o.   MRN: [id number]  No chief complaint on file.   Propanolol? What for  HPI  Discussed the use of AI scribe software for clinical note transcription with the patient, who gave verbal consent to proceed.  Follows with-behavioral health  HTN-losartan  25 mg,  HLD-atorvastatin  40 mg daily  Depression-follows with behavioral health Effexor  112.5 mg daily, Zoloft  100 mg  Hypothyroidism-levothyroxine  50 mcg  Insomnia-Ambien  10 mg at bedtime  Patient denies fever, chills, SOB, CP, palpitations, dyspnea, edema, HA, vision changes, N/V/D, abdominal pain, urinary symptoms, rash, weight changes, and recent illness or hospitalizations.   History of Present Illness              Health Maintenance Due  Topic Date Due   HIV Screening  Never done   Pneumococcal Vaccine: 50+ Years (2 of 2 - PCV20 or PCV21) 11/15/2014    Past Medical History:  Diagnosis Date   Allergy 2010   Anxiety    Arthritis of right hip 10/28/2016   Basal cell carcinoma 11/17/2011   Dr. Veryl   Cataract 2010   Colon polyps    Cutaneous skin tags 07/24/2013   DDD (degenerative disc disease), cervical    s/p fusion   DDD (degenerative disc disease), thoracolumbar    on disability   Depression    Headache(784.0) 01/29/2013   Hearing loss    Saw ENT on 04/27/2017; Moderate hearing loss in both ears.   Heartburn    Hiatal hernia    HTN (hypertension) 2002   Hyperlipemia 08/2014   Hyperplastic colon polyp    Hypothyroid    Internal hemorrhoids    Obesity 04/30/2017   Pain in joint, shoulder region 11/20/2013   Pancreatitis 04/24/2011   Pneumonia 07/24/2013   Preventative health care 10/28/2016   Preventative health care 10/28/2016   Tachycardia 08/12/2014   Vitamin D  deficiency 11/18/2015    Past Surgical History:  Procedure Laterality Date   ANTERIOR FUSION CERVICAL SPINE  12/22/2006   C6-7  Dr Onetha    CHOLECYSTECTOMY N/A 02/27/2013   Procedure: LAPAROSCOPIC CHOLECYSTECTOMY WITH INTRAOPERATIVE CHOLANGIOGRAM;  Surgeon: Elspeth KYM Schultze, MD;  Location: [facility];  Service: General;  Laterality: N/A;   POSTERIOR FUSION CERVICAL SPINE  12/22/2009   C6-7   SPINE SURGERY  2009    Family History  Problem Relation Age of Onset   Hypertension Mother    Hyperlipidemia Mother    Arthritis Mother    Hearing loss Mother    Other Father        DDD, colectomy,cardiac stent, afib   Colon polyps Father    Anxiety disorder Father    COPD Father        died due to complications from Covid-19   Depression Father    Diabetes Father    Heart disease Father    Hypertension Brother    Diabetes Maternal Grandmother    Diabetes Paternal Grandmother    Colon cancer Neg Hx    Stomach cancer Neg Hx    Kidney disease Neg Hx     Social History   Socioeconomic History   Marital status: Widowed    Spouse name: Not on file   Number of children: 0   Years of education: Not on file   Highest education level: Bachelor's degree (e.g., BA, AB, BS)  Occupational History   Occupation: Disabled    Employer: UNEMPLOYED  Tobacco  Use   Smoking status: Never   Smokeless tobacco: Never  Vaping Use   Vaping status: Never Used  Substance and Sexual Activity   Alcohol use: Yes    Alcohol/week: 5.0 standard drinks of alcohol    Types: 5 Cans of beer per week   Drug use: No   Sexual activity: Not Currently  Other Topics Concern   Not on file  Social History Narrative   Reviewed history and no changes required.   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   Regular exercise-no   Smoking Status:  quit   Caffeine use/day:  none   Does Patient Exercise:  no         Social Drivers of Corporate Investment Banker Strain: Low Risk  (11/18/2024)   Overall Financial Resource Strain (CARDIA)    Difficulty of Paying  Living Expenses: Not very hard  Food Insecurity: No Food Insecurity (11/18/2024)   Hunger Vital Sign    Worried About Running Out of Food in the Last Year: Never true    Ran Out of Food in the Last Year: Never true  Transportation Needs: No Transportation Needs (11/18/2024)   PRAPARE - Administrator, Civil Service (Medical): No    Lack of Transportation (Non-Medical): No  Physical Activity: Inactive (11/18/2024)   Exercise Vital Sign    Days of Exercise per Week: 0 days    Minutes of Exercise per Session: Not on file  Stress: Stress Concern Present (11/18/2024)   [employer] of Occupational Health - Occupational Stress Questionnaire    Feeling of Stress: To some extent  Social Connections: Socially Isolated (11/18/2024)   Social Connection and Isolation Panel    Frequency of Communication with Friends and Family: More than three times a week    Frequency of Social Gatherings with Friends and Family: Three times a week    Attends Religious Services: Never    Active Member of Clubs or Organizations: No    Attends Banker Meetings: Not on file    Marital Status: Widowed  Intimate Partner Violence: Not on file    Outpatient Medications Prior to Visit  Medication Sig Dispense Refill   atorvastatin  (LIPITOR) 40 MG tablet Take 1 tablet (40 mg total) by mouth daily. 90 tablet 1   diclofenac  Sodium (VOLTAREN ) 1 % GEL Apply 2 g topically as needed.     diphenoxylate -atropine  (LOMOTIL ) 2.5-0.025 MG tablet TAKE ONE TO TWO TABLETS BY MOUTH FOUR TIMES DAILY AS NEEDED FOR DIARRHEA OR LOOSE STOOLS MAX OF 8 TABLETS PER 24 HOURS AS DIRECTED 30 tablet 1   hyoscyamine  (LEVSIN  SL) 0.125 MG SL tablet Take 1-2 tablets under tongue every 6 hours as needed for IBS 90 tablet 1   levothyroxine  (SYNTHROID ) 50 MCG tablet Take 1 tablet (50 mcg total) by mouth daily before breakfast. 90 tablet 1   losartan  (COZAAR ) 25 MG tablet Take 1 tablet (25 mg total) by mouth daily. 90 tablet 1    pantoprazole  (PROTONIX ) 40 MG tablet Take 1 tablet (40 mg total) by mouth daily. 90 tablet 3   propranolol  (INDERAL ) 40 MG tablet TAKE 1 TABLET BY MOUTH 2 TIMES A DAY 60 tablet 1   sertraline  (ZOLOFT ) 100 MG tablet Take 1 tablet (100 mg total) by mouth every morning.     venlafaxine  XR (EFFEXOR  XR) 37.5 MG 24 hr capsule Take 1  capsule (37.5 mg total) by mouth daily with breakfast. With a 75 mg capsule for a total of 112.5 mg daily 90 capsule 1   venlafaxine  XR (EFFEXOR -XR) 75 MG 24 hr capsule TAKE 1 CAPSULE BY MOUTH DAILY WITH BREAKFAST 30 capsule 3   zolpidem  (AMBIEN ) 10 MG tablet TAKE 1 TABLET BY MOUTH ONCE EVERY NIGHT AT BEDTIME AS NEEDED FOR SLEEP 30 tablet 1   No facility-administered medications prior to visit.    Allergies  Allergen Reactions   Amoxicillin-Pot Clavulanate     REACTION: Throat swelling.   Azithromycin     REACTION: Throat Swelling   Cefazolin     REACTION: Throat Swelling   Moxifloxacin     REACTION: Throat Swelling   Penicillins     REACTION: throat swelling   Sulfonamide Derivatives     ROS     Objective:    Physical Exam Vitals reviewed.  Constitutional:      General: She is not in acute distress.    Appearance: She is not toxic-appearing.  HENT:     Head: Normocephalic and atraumatic.     Mouth/Throat:     Mouth: Mucous membranes are moist.     Pharynx: Oropharynx is clear.  Eyes:     Pupils: Pupils are equal, round, and reactive to light.  Cardiovascular:     Rate and Rhythm: Normal rate and regular rhythm.     Pulses: Normal pulses.     Heart sounds: Normal heart sounds. No murmur heard. Pulmonary:     Effort: Pulmonary effort is normal. No respiratory distress.     Breath sounds: Normal breath sounds. No wheezing.  Musculoskeletal:        General: No swelling.     Cervical back: Neck supple.  Skin:    General: Skin is warm and dry.  Neurological:     General: No focal deficit present.     Mental Status: She is alert and oriented  to person, place, and time.  Psychiatric:        Mood and Affect: Mood normal.        Behavior: Behavior normal.        Thought Content: Thought content normal.        Judgment: Judgment normal.      LMP 09/23/2011  Wt Readings from Last 3 Encounters:  08/08/24 206 lb (93.4 kg)  07/11/24 202 lb 9.6 oz (91.9 kg)  05/24/24 206 lb 5 oz (93.6 kg)       Assessment & Plan:   Problem List Items Addressed This Visit     Depression - Primary   Stable on current medications.      Hyperlipidemia   Tolerating statin. Encourage heart healthy diet such as MIND or DASH diet, increase exercise, avoid trans fats, simple carbohydrates and processed foods, consider a krill or fish or flaxseed oil cap daily.        Hypothyroidism   On levothyroxine .  Continue to monitor.      Insomnia   Doing well on current medication.  Encouraged good sleep hygiene such as dark, quiet room. No blue/green glowing lights such as computer screens in bedroom. No alcohol or stimulants in evening. Cut down on caffeine as able. Regular exercise is helpful but not just prior to bed time.           Obesity   Encouraged DASH or MIND diet, decrease po intake and increase exercise as tolerated. Needs 7-8 hours of sleep nightly. Avoid trans fats,  eat small, frequent meals every 4-5 hours with lean proteins, complex carbs and healthy fats. Minimize simple carbs, high fat foods and processed foods        Vitamin D  deficiency   Supplement and monitor       I am having Kathy PHEBE Huerta maintain her diphenoxylate -atropine , diclofenac  Sodium, pantoprazole , hyoscyamine , sertraline , venlafaxine  XR, levothyroxine , propranolol , zolpidem , venlafaxine  XR, losartan , and atorvastatin .  No orders of the defined types were placed in this encounter.

## 2024-11-23 NOTE — Assessment & Plan Note (Signed)
 Encouraged DASH or MIND diet, decrease po intake and increase exercise as tolerated. Needs 7-8 hours of sleep nightly. Avoid trans fats, eat small, frequent meals every 4-5 hours with lean proteins, complex carbs and healthy fats. Minimize simple carbs, high fat foods and processed foods

## 2024-11-23 NOTE — Assessment & Plan Note (Signed)
 Supplement and monitor

## 2024-11-24 ENCOUNTER — Encounter: Payer: Self-pay | Admitting: Student

## 2024-11-24 ENCOUNTER — Ambulatory Visit: Admitting: Student

## 2024-11-24 ENCOUNTER — Other Ambulatory Visit

## 2024-11-24 VITALS — BP 110/66 | HR 73 | Ht 66.0 in | Wt 203.4 lb

## 2024-11-24 DIAGNOSIS — E039 Hypothyroidism, unspecified: Secondary | ICD-10-CM

## 2024-11-24 DIAGNOSIS — E6609 Other obesity due to excess calories: Secondary | ICD-10-CM

## 2024-11-24 DIAGNOSIS — E782 Mixed hyperlipidemia: Secondary | ICD-10-CM

## 2024-11-24 DIAGNOSIS — R739 Hyperglycemia, unspecified: Secondary | ICD-10-CM

## 2024-11-24 DIAGNOSIS — Z0000 Encounter for general adult medical examination without abnormal findings: Secondary | ICD-10-CM

## 2024-11-24 DIAGNOSIS — Z23 Encounter for immunization: Secondary | ICD-10-CM

## 2024-11-24 DIAGNOSIS — E559 Vitamin D deficiency, unspecified: Secondary | ICD-10-CM

## 2024-11-24 DIAGNOSIS — G4700 Insomnia, unspecified: Secondary | ICD-10-CM

## 2024-11-24 DIAGNOSIS — Z01419 Encounter for gynecological examination (general) (routine) without abnormal findings: Secondary | ICD-10-CM

## 2024-11-24 DIAGNOSIS — J309 Allergic rhinitis, unspecified: Secondary | ICD-10-CM | POA: Insufficient documentation

## 2024-11-24 DIAGNOSIS — I10 Essential (primary) hypertension: Secondary | ICD-10-CM

## 2024-11-24 DIAGNOSIS — F321 Major depressive disorder, single episode, moderate: Secondary | ICD-10-CM

## 2024-11-24 MED ORDER — VENLAFAXINE HCL 100 MG PO TABS: 100.0000 mg | ORAL_TABLET | Freq: Two times a day (BID) | ORAL | 1 refills | Status: AC

## 2024-11-24 MED ORDER — FLUTICASONE PROPIONATE 50 MCG/ACT NA SUSP: 2.0000 | Freq: Every day | NASAL | 6 refills | Status: AC

## 2024-11-24 NOTE — Assessment & Plan Note (Addendum)
 Symptoms of nasal drainage and irritation, likely allergy-related,  - Start Flonase nasal spray, one spray each nostril daily.

## 2024-11-24 NOTE — Assessment & Plan Note (Signed)
 Well controlled, no changes to meds. Encouraged heart healthy diet such as the DASH diet and exercise as tolerated.

## 2024-11-24 NOTE — Assessment & Plan Note (Signed)
 [id number] acceptable, minimize simple carbs. Increase exercise as tolerated. Continue current meds

## 2024-11-25 LAB — CBC WITH DIFFERENTIAL/PLATELET
Basophils Absolute: 0.1 K/uL (ref 0.0–0.1)
Basophils Relative: 1.2 % (ref 0.0–3.0)
Eosinophils Absolute: 0.1 K/uL (ref 0.0–0.7)
Eosinophils Relative: 1.7 % (ref 0.0–5.0)
HCT: 39.5 % (ref 36.0–46.0)
Hemoglobin: 13.3 g/dL (ref 12.0–15.0)
Lymphocytes Relative: 26.4 % (ref 12.0–46.0)
Lymphs Abs: 1.9 K/uL (ref 0.7–4.0)
MCHC: 33.8 g/dL (ref 30.0–36.0)
MCV: 90.6 fl (ref 78.0–100.0)
Monocytes Absolute: 0.4 K/uL (ref 0.1–1.0)
Monocytes Relative: 6.1 % (ref 3.0–12.0)
Neutro Abs: 4.6 K/uL (ref 1.4–7.7)
Neutrophils Relative %: 64.6 % (ref 43.0–77.0)
Platelets: 287 K/uL (ref 150.0–400.0)
RBC: 4.36 Mil/uL (ref 3.87–5.11)
RDW: 13.2 % (ref 11.5–15.5)
WBC: 7.1 K/uL (ref 4.0–10.5)

## 2024-11-25 LAB — COMPREHENSIVE METABOLIC PANEL WITH GFR
ALT: 15 U/L (ref 0–35)
AST: 16 U/L (ref 0–37)
Albumin: 4.2 g/dL (ref 3.5–5.2)
Alkaline Phosphatase: 86 U/L (ref 39–117)
BUN: 9 mg/dL (ref 6–23)
CO2: 28 meq/L (ref 19–32)
Calcium: 9.2 mg/dL (ref 8.4–10.5)
Chloride: 102 meq/L (ref 96–112)
Creatinine, Ser: 0.54 mg/dL (ref 0.40–1.20)
GFR: 97.12 mL/min (ref >60.00)
Glucose, Bld: 83 mg/dL (ref 70–99)
Potassium: 4.1 meq/L (ref 3.5–5.1)
Sodium: 139 meq/L (ref 135–145)
Total Bilirubin: 0.7 mg/dL (ref 0.2–1.2)
Total Protein: 6.7 g/dL (ref 6.0–8.3)

## 2024-11-25 LAB — LIPID PANEL
Cholesterol: 158 mg/dL (ref 0–200)
HDL: 52.7 mg/dL (ref >39.00)
LDL Cholesterol: 71 mg/dL (ref 0–99)
NonHDL: 105.75
Total CHOL/HDL Ratio: 3
Triglycerides: 174 mg/dL — ABNORMAL HIGH (ref 0.0–149.0)
VLDL: 34.8 mg/dL (ref 0.0–40.0)

## 2024-11-25 LAB — HEMOGLOBIN A1C: Hgb A1c MFr Bld: 5.7 % (ref 4.6–6.5)

## 2024-11-25 LAB — TSH: TSH: 1.87 u[IU]/mL (ref 0.35–5.50)

## 2024-11-25 LAB — HIV ANTIBODY (ROUTINE TESTING W REFLEX)
HIV 1&2 Ab, 4th Generation: NONREACTIVE
HIV FINAL INTERPRETATION: NEGATIVE

## 2024-11-25 LAB — VITAMIN D 25 HYDROXY (VIT D DEFICIENCY, FRACTURES): VITD: 33.26 ng/mL (ref 30.00–100.00)

## 2024-11-26 ENCOUNTER — Ambulatory Visit: Payer: Self-pay | Admitting: Family Medicine

## 2024-11-28 ENCOUNTER — Other Ambulatory Visit: Payer: Self-pay

## 2024-11-28 MED ORDER — DIPHENOXYLATE-ATROPINE 2.5-0.025 MG PO TABS: ORAL_TABLET | ORAL | 1 refills | Status: AC

## 2024-11-28 NOTE — Telephone Encounter (Signed)
 Requesting: Lomotil   Contract: n/a UDS: n/a Last Visit: 11/24/24 w/ Kathy Huerta Next Visit: 02/27/25 Last Refill: 04/11/24 #30 and 1rf   Please Advise

## 2024-12-06 ENCOUNTER — Ambulatory Visit: Admitting: Behavioral Health

## 2024-12-06 DIAGNOSIS — F320 Major depressive disorder, single episode, mild: Secondary | ICD-10-CM

## 2024-12-06 DIAGNOSIS — Z636 Dependent relative needing care at home: Secondary | ICD-10-CM | POA: Diagnosis not present

## 2024-12-06 NOTE — Progress Notes (Addendum)
 [facility] Counselor/Therapist Progress Note  Patient ID: Kathy Huerta, MRN: [id number],    Date: 12/06/2024  Time Spent: 50 min Caregility video; Pt is home in private & Provider working remotely from [employer]. Pt is aware of the risks/limitations of telehealth & consents to Tx today.  Time In: 2:00pm Time Out: 2:50pm   Treatment Type: Individual Therapy  Reported Symptoms: Elevated anx/dep & stress due to Caregiving burden  Mental Status Exam: Appearance:  Casual     Behavior: Appropriate and Sharing  Motor: Normal  Speech/Language:  Clear and Coherent  Affect: Appropriate  Mood: normal  Thought process: normal  Thought content:   WNL  Sensory/Perceptual disturbances:   WNL  Orientation: oriented to person, place, time/date, and situation  Attention: Good  Concentration: Good  Memory: WNL  Fund of knowledge:  Good  Insight:   Good  Judgment:  Good  Impulse Control: Good   Risk Assessment: Danger to Self:  No Self-injurious Behavior: No Danger to Others: No Duty to Warn:no Physical Aggression / Violence:No  Access to Firearms a concern: No  Gang Involvement:No   Subjective: Pt reports the Holidays are getting tough since her next door Neighbor who is actively grieving has spoken to her. This is a hard situation for Pt since the death of her own Husb.   Discussed the process of Husb's hospitalization @ [facility] & how Pt managed this for weeks prior to his death.  Interventions: Insight-Oriented & Grief Therapy  Diagnosis:Current mild episode of major depressive disorder without prior episode  Caregiver burden  Plan: Marianela will be intentional about her time w/her Mother over the Holidays. She has been the primary Caregiver during the stress of her Mother's transition into different levels of care @ [employer]. She hopes the Holiday will go smoothly as her Family copes w/the care of her Mother. She has expressed many takeaways today from our  sessions & she is grateful for the compassion.   Target Date: 01/05/2025  Progress: 6  Frequency: Once every 2-3 wks  Modality: [name], LMFT

## 2024-12-15 ENCOUNTER — Other Ambulatory Visit: Payer: Self-pay | Admitting: Family Medicine

## 2024-12-27 ENCOUNTER — Ambulatory Visit: Admitting: Behavioral Health

## 2024-12-27 DIAGNOSIS — Z636 Dependent relative needing care at home: Secondary | ICD-10-CM

## 2024-12-27 DIAGNOSIS — F4329 Adjustment disorder with other symptoms: Secondary | ICD-10-CM

## 2024-12-27 DIAGNOSIS — F321 Major depressive disorder, single episode, moderate: Secondary | ICD-10-CM | POA: Diagnosis not present

## 2025-01-04 NOTE — Progress Notes (Signed)
"    [facility] Counselor/Therapist Progress Note  Patient ID: Kathy Huerta, MRN: [id number],    Date: 12/27/2024  Time Spent: 50 min In Person @ [facility] Office Time In: 3:00pm Time Out: 3:50pm  Treatment Type: Individual Therapy  Reported Symptoms: Elevated anx/dep & grief due to the end of the Holiday season  Mental Status Exam: Appearance:  Casual     Behavior: Appropriate and Sharing  Motor: Normal  Speech/Language:  Clear and Coherent  Affect: Appropriate  Mood: anxious and sad  Thought process: normal  Thought content:   WNL  Sensory/Perceptual disturbances:   WNL  Orientation: oriented to person, place, time/date, and situation  Attention: Good  Concentration: Good  Memory: WNL  Fund of knowledge:  Good  Insight:   Good  Judgment:  Good  Impulse Control: Good   Risk Assessment: Danger to Self:  No Self-injurious Behavior: No Danger to Others: No Duty to Warn:no Physical Aggression / Violence:No  Access to Firearms a concern: No  Gang Involvement:No   Subjective: Pt is tearful today from the stress of the Holidays, caring for her Mother's needs & feeling resentment towards her Bros in [place] who seems oblivious to her Mother's current state of health. She thought the Holiday season would bring some respite for her, but her Bros & his Wife stayed less than 3 hrs on Christmas Day to spend dinner w/her Mother. His lack of commitment to the Family has Pt frustrated & angry.   Interventions: Grief Therapy and Interpersonal  Diagnosis:Current moderate episode of major depressive disorder without prior episode (HCC)  Prolonged grief reaction  Caregiver burden  Plan: Deliana & I spent sig time today discussing the role of Family when Parental needs grow w/aging. She is realistic about her Terresa' investment & finds it hard to believe how indifferent he presents @ in-person visits. Genessa will explore the Caregiver Support Grp through [facility] & ask if [employer]  holds these groups for [employer] on-site. She will report back. Ena will also find some addt'l activities she may be interested in to get her out of the house w/intention & purpose. We will discuss @ our next session.   Target Date: 01/20/2025  Progress: 7  Frequency: Once every 2-3 wks  Modality: [name], LMFT    "

## 2025-01-10 ENCOUNTER — Ambulatory Visit: Admitting: Behavioral Health

## 2025-01-11 ENCOUNTER — Ambulatory Visit: Admitting: Behavioral Health

## 2025-01-20 NOTE — Progress Notes (Unsigned)
"    [facility] Counselor/Therapist Progress Note  Patient ID: Kathy Huerta, MRN: [id number],    Date: 01/11/2025  Time Spent: ***   Treatment Type: {CHL AMB THERAPY TYPES:7401059288}  Reported Symptoms: ***  Mental Status Exam: Appearance:  {PSY:22683}     Behavior: {PSY:21022743}  Motor: {PSY:22302}  Speech/Language:  {PSY:22685}  Affect: {PSY:22687}  Mood: {PSY:31886}  Thought process: {PSY:31888}  Thought content:   {PSY:310-839-3578}  Sensory/Perceptual disturbances:   {PSY:302-884-9704}  Orientation: {PSY:30297}  Attention: {PSY:22877}  Concentration: {PSY:534-317-6005}  Memory: {PSY:646-562-4594}  Fund of knowledge:  {PSY:534-317-6005}  Insight:   {PSY:534-317-6005}  Judgment:  {PSY:534-317-6005}  Impulse Control: {PSY:534-317-6005}   Risk Assessment: Danger to Self:  {PSY:22692} Self-injurious Behavior: {PSY:22692} Danger to Others: {PSY:22692} Duty to Warn:{PSY:311194} Physical Aggression / Violence:{PSY:21197} Access to Firearms a concern: {PSY:21197} Gang Involvement:{PSY:21197}  Subjective: ***   Interventions: {PSY:820 670 0659}  Diagnosis:No diagnosis found.  Plan: ***  Richerd LITTIE Ling, LMFT    "

## 2025-02-01 ENCOUNTER — Ambulatory Visit: Admitting: Behavioral Health

## 2025-02-27 ENCOUNTER — Encounter: Admitting: Family Medicine

## 2025-05-18 ENCOUNTER — Encounter: Admitting: Student
